# Patient Record
Sex: Male | Born: 1945
Health system: Southern US, Community
[De-identification: ages and names within clinical notes are randomized; demographics above are authoritative.]

## PROBLEM LIST (undated history)

## (undated) DIAGNOSIS — I519 Heart disease, unspecified: Secondary | ICD-10-CM

## (undated) DIAGNOSIS — F1010 Alcohol abuse, uncomplicated: Secondary | ICD-10-CM

## (undated) DIAGNOSIS — N183 Chronic kidney disease, stage 3 (moderate): Secondary | ICD-10-CM

## (undated) DIAGNOSIS — C61 Malignant neoplasm of prostate: Secondary | ICD-10-CM

## (undated) DIAGNOSIS — I5031 Acute diastolic (congestive) heart failure: Secondary | ICD-10-CM

## (undated) DIAGNOSIS — I5043 Acute on chronic combined systolic (congestive) and diastolic (congestive) heart failure: Secondary | ICD-10-CM

## (undated) DIAGNOSIS — I130 Hypertensive heart and chronic kidney disease with heart failure and stage 1 through stage 4 chronic kidney disease, or unspecified chronic kidney disease: Secondary | ICD-10-CM

## (undated) DIAGNOSIS — F4310 Post-traumatic stress disorder, unspecified: Secondary | ICD-10-CM

## (undated) DIAGNOSIS — K5731 Diverticulosis of large intestine without perforation or abscess with bleeding: Secondary | ICD-10-CM

## (undated) DIAGNOSIS — E785 Hyperlipidemia, unspecified: Secondary | ICD-10-CM

## (undated) DIAGNOSIS — G459 Transient cerebral ischemic attack, unspecified: Secondary | ICD-10-CM

## (undated) DIAGNOSIS — K922 Gastrointestinal hemorrhage, unspecified: Secondary | ICD-10-CM

## (undated) HISTORY — PX: STOMACH SURGERY: SHX791

## (undated) HISTORY — DX: Post-traumatic stress disorder, unspecified: F43.10

## (undated) HISTORY — DX: Gastrointestinal hemorrhage, unspecified: K92.2

## (undated) HISTORY — DX: Hyperlipidemia, unspecified: E78.5

## (undated) HISTORY — DX: Acute diastolic (congestive) heart failure: I50.31

## (undated) HISTORY — DX: Hypertensive heart and chronic kidney disease with heart failure and stage 1 through stage 4 chronic kidney disease, or unspecified chronic kidney disease: I13.0

## (undated) HISTORY — PX: TONSILLECTOMY: SUR1361

## (undated) HISTORY — DX: Diverticulosis of large intestine without perforation or abscess with bleeding: K57.31

## (undated) HISTORY — DX: Acute on chronic combined systolic (congestive) and diastolic (congestive) heart failure: I50.43

## (undated) HISTORY — PX: INSERTION PROSTATE RADIATION SEED: SUR718

## (undated) HISTORY — DX: Transient cerebral ischemic attack, unspecified: G45.9

## (undated) HISTORY — DX: Chronic kidney disease, stage 3 (moderate): N18.3

## (undated) HISTORY — DX: Alcohol abuse, uncomplicated: F10.10

## (undated) HISTORY — PX: APPENDECTOMY: SHX54

## (undated) HISTORY — PX: INGUINAL HERNIA REPAIR: SUR1180

## (undated) HISTORY — DX: Heart disease, unspecified: I51.9

---

## 2005-08-22 DIAGNOSIS — E538 Deficiency of other specified B group vitamins: Secondary | ICD-10-CM | POA: Insufficient documentation

## 2013-05-16 DIAGNOSIS — S61209A Unspecified open wound of unspecified finger without damage to nail, initial encounter: Secondary | ICD-10-CM | POA: Diagnosis not present

## 2013-09-25 DIAGNOSIS — L03039 Cellulitis of unspecified toe: Secondary | ICD-10-CM | POA: Diagnosis not present

## 2013-09-25 DIAGNOSIS — L600 Ingrowing nail: Secondary | ICD-10-CM | POA: Diagnosis not present

## 2013-11-17 DIAGNOSIS — R5381 Other malaise: Secondary | ICD-10-CM | POA: Diagnosis not present

## 2013-11-17 DIAGNOSIS — K922 Gastrointestinal hemorrhage, unspecified: Secondary | ICD-10-CM | POA: Diagnosis not present

## 2013-11-17 DIAGNOSIS — S3981XA Other specified injuries of abdomen, initial encounter: Secondary | ICD-10-CM | POA: Diagnosis not present

## 2013-11-17 DIAGNOSIS — R5383 Other fatigue: Secondary | ICD-10-CM | POA: Diagnosis not present

## 2013-11-17 DIAGNOSIS — D62 Acute posthemorrhagic anemia: Secondary | ICD-10-CM | POA: Diagnosis not present

## 2013-11-18 DIAGNOSIS — I10 Essential (primary) hypertension: Secondary | ICD-10-CM | POA: Diagnosis not present

## 2013-11-18 DIAGNOSIS — Z86010 Personal history of colonic polyps: Secondary | ICD-10-CM | POA: Diagnosis not present

## 2013-11-18 DIAGNOSIS — D62 Acute posthemorrhagic anemia: Secondary | ICD-10-CM | POA: Diagnosis not present

## 2013-11-18 DIAGNOSIS — D649 Anemia, unspecified: Secondary | ICD-10-CM | POA: Diagnosis not present

## 2013-11-18 DIAGNOSIS — D126 Benign neoplasm of colon, unspecified: Secondary | ICD-10-CM | POA: Diagnosis not present

## 2013-11-18 DIAGNOSIS — R195 Other fecal abnormalities: Secondary | ICD-10-CM | POA: Diagnosis not present

## 2013-11-18 DIAGNOSIS — K5730 Diverticulosis of large intestine without perforation or abscess without bleeding: Secondary | ICD-10-CM | POA: Diagnosis not present

## 2013-11-18 DIAGNOSIS — K921 Melena: Secondary | ICD-10-CM | POA: Diagnosis not present

## 2013-11-18 DIAGNOSIS — N189 Chronic kidney disease, unspecified: Secondary | ICD-10-CM | POA: Diagnosis not present

## 2013-11-19 DIAGNOSIS — D126 Benign neoplasm of colon, unspecified: Secondary | ICD-10-CM | POA: Diagnosis not present

## 2013-11-19 DIAGNOSIS — D62 Acute posthemorrhagic anemia: Secondary | ICD-10-CM | POA: Diagnosis not present

## 2013-11-19 DIAGNOSIS — R195 Other fecal abnormalities: Secondary | ICD-10-CM | POA: Diagnosis not present

## 2013-11-19 DIAGNOSIS — D649 Anemia, unspecified: Secondary | ICD-10-CM | POA: Diagnosis not present

## 2013-11-19 DIAGNOSIS — K922 Gastrointestinal hemorrhage, unspecified: Secondary | ICD-10-CM | POA: Diagnosis not present

## 2013-11-19 DIAGNOSIS — K921 Melena: Secondary | ICD-10-CM | POA: Diagnosis not present

## 2013-11-19 DIAGNOSIS — I10 Essential (primary) hypertension: Secondary | ICD-10-CM | POA: Diagnosis not present

## 2013-11-19 DIAGNOSIS — K5730 Diverticulosis of large intestine without perforation or abscess without bleeding: Secondary | ICD-10-CM | POA: Diagnosis not present

## 2013-11-19 DIAGNOSIS — Z86010 Personal history of colonic polyps: Secondary | ICD-10-CM | POA: Diagnosis not present

## 2013-11-20 DIAGNOSIS — K5730 Diverticulosis of large intestine without perforation or abscess without bleeding: Secondary | ICD-10-CM | POA: Diagnosis not present

## 2013-11-20 DIAGNOSIS — K922 Gastrointestinal hemorrhage, unspecified: Secondary | ICD-10-CM | POA: Diagnosis not present

## 2013-11-20 DIAGNOSIS — D62 Acute posthemorrhagic anemia: Secondary | ICD-10-CM | POA: Diagnosis not present

## 2013-11-20 DIAGNOSIS — I10 Essential (primary) hypertension: Secondary | ICD-10-CM | POA: Diagnosis not present

## 2013-11-27 DIAGNOSIS — Z6827 Body mass index (BMI) 27.0-27.9, adult: Secondary | ICD-10-CM | POA: Diagnosis not present

## 2013-11-27 DIAGNOSIS — D62 Acute posthemorrhagic anemia: Secondary | ICD-10-CM | POA: Diagnosis not present

## 2013-11-27 DIAGNOSIS — N289 Disorder of kidney and ureter, unspecified: Secondary | ICD-10-CM | POA: Diagnosis not present

## 2013-11-27 DIAGNOSIS — K922 Gastrointestinal hemorrhage, unspecified: Secondary | ICD-10-CM | POA: Diagnosis not present

## 2013-11-27 DIAGNOSIS — K5731 Diverticulosis of large intestine without perforation or abscess with bleeding: Secondary | ICD-10-CM | POA: Diagnosis not present

## 2013-12-11 DIAGNOSIS — D649 Anemia, unspecified: Secondary | ICD-10-CM | POA: Diagnosis not present

## 2013-12-18 DIAGNOSIS — Z86010 Personal history of colonic polyps: Secondary | ICD-10-CM | POA: Diagnosis not present

## 2013-12-18 DIAGNOSIS — D500 Iron deficiency anemia secondary to blood loss (chronic): Secondary | ICD-10-CM | POA: Diagnosis not present

## 2013-12-18 DIAGNOSIS — K5730 Diverticulosis of large intestine without perforation or abscess without bleeding: Secondary | ICD-10-CM | POA: Diagnosis not present

## 2013-12-18 DIAGNOSIS — D126 Benign neoplasm of colon, unspecified: Secondary | ICD-10-CM | POA: Diagnosis not present

## 2014-01-18 DIAGNOSIS — T7840XA Allergy, unspecified, initial encounter: Secondary | ICD-10-CM | POA: Diagnosis not present

## 2014-08-21 DIAGNOSIS — I10 Essential (primary) hypertension: Secondary | ICD-10-CM | POA: Diagnosis not present

## 2014-08-21 DIAGNOSIS — K4030 Unilateral inguinal hernia, with obstruction, without gangrene, not specified as recurrent: Secondary | ICD-10-CM | POA: Diagnosis not present

## 2014-08-21 DIAGNOSIS — R102 Pelvic and perineal pain: Secondary | ICD-10-CM | POA: Diagnosis not present

## 2014-08-21 DIAGNOSIS — Z8673 Personal history of transient ischemic attack (TIA), and cerebral infarction without residual deficits: Secondary | ICD-10-CM | POA: Diagnosis not present

## 2014-08-28 DIAGNOSIS — I10 Essential (primary) hypertension: Secondary | ICD-10-CM | POA: Diagnosis not present

## 2014-08-28 DIAGNOSIS — E780 Pure hypercholesterolemia: Secondary | ICD-10-CM | POA: Diagnosis not present

## 2014-08-28 DIAGNOSIS — Z01810 Encounter for preprocedural cardiovascular examination: Secondary | ICD-10-CM | POA: Diagnosis not present

## 2014-09-02 DIAGNOSIS — I10 Essential (primary) hypertension: Secondary | ICD-10-CM | POA: Diagnosis not present

## 2014-09-02 DIAGNOSIS — R0602 Shortness of breath: Secondary | ICD-10-CM | POA: Diagnosis not present

## 2014-09-02 DIAGNOSIS — G459 Transient cerebral ischemic attack, unspecified: Secondary | ICD-10-CM | POA: Diagnosis not present

## 2014-09-08 DIAGNOSIS — Z01810 Encounter for preprocedural cardiovascular examination: Secondary | ICD-10-CM | POA: Diagnosis not present

## 2014-09-08 DIAGNOSIS — R079 Chest pain, unspecified: Secondary | ICD-10-CM | POA: Diagnosis not present

## 2014-09-08 DIAGNOSIS — D689 Coagulation defect, unspecified: Secondary | ICD-10-CM | POA: Diagnosis not present

## 2014-09-08 DIAGNOSIS — E780 Pure hypercholesterolemia: Secondary | ICD-10-CM | POA: Diagnosis not present

## 2014-09-08 DIAGNOSIS — Z87891 Personal history of nicotine dependence: Secondary | ICD-10-CM | POA: Diagnosis not present

## 2014-09-08 DIAGNOSIS — I10 Essential (primary) hypertension: Secondary | ICD-10-CM | POA: Diagnosis not present

## 2014-09-16 DIAGNOSIS — E780 Pure hypercholesterolemia: Secondary | ICD-10-CM | POA: Diagnosis not present

## 2014-09-16 DIAGNOSIS — I10 Essential (primary) hypertension: Secondary | ICD-10-CM | POA: Diagnosis not present

## 2014-09-16 DIAGNOSIS — I25118 Atherosclerotic heart disease of native coronary artery with other forms of angina pectoris: Secondary | ICD-10-CM | POA: Diagnosis not present

## 2014-09-16 DIAGNOSIS — I2510 Atherosclerotic heart disease of native coronary artery without angina pectoris: Secondary | ICD-10-CM | POA: Diagnosis not present

## 2014-09-16 DIAGNOSIS — E785 Hyperlipidemia, unspecified: Secondary | ICD-10-CM | POA: Diagnosis not present

## 2014-09-16 DIAGNOSIS — Z79899 Other long term (current) drug therapy: Secondary | ICD-10-CM | POA: Diagnosis not present

## 2014-09-16 DIAGNOSIS — R9439 Abnormal result of other cardiovascular function study: Secondary | ICD-10-CM | POA: Diagnosis not present

## 2014-09-16 DIAGNOSIS — M1990 Unspecified osteoarthritis, unspecified site: Secondary | ICD-10-CM | POA: Diagnosis not present

## 2014-09-16 DIAGNOSIS — Z7902 Long term (current) use of antithrombotics/antiplatelets: Secondary | ICD-10-CM | POA: Diagnosis not present

## 2014-09-16 DIAGNOSIS — Z87891 Personal history of nicotine dependence: Secondary | ICD-10-CM | POA: Diagnosis not present

## 2014-09-16 DIAGNOSIS — Z8673 Personal history of transient ischemic attack (TIA), and cerebral infarction without residual deficits: Secondary | ICD-10-CM | POA: Diagnosis not present

## 2014-10-02 DIAGNOSIS — M1990 Unspecified osteoarthritis, unspecified site: Secondary | ICD-10-CM | POA: Diagnosis not present

## 2014-10-02 DIAGNOSIS — I10 Essential (primary) hypertension: Secondary | ICD-10-CM | POA: Diagnosis not present

## 2014-10-02 DIAGNOSIS — K4030 Unilateral inguinal hernia, with obstruction, without gangrene, not specified as recurrent: Secondary | ICD-10-CM | POA: Diagnosis not present

## 2014-10-13 DIAGNOSIS — Z8673 Personal history of transient ischemic attack (TIA), and cerebral infarction without residual deficits: Secondary | ICD-10-CM | POA: Diagnosis not present

## 2014-10-13 DIAGNOSIS — K4030 Unilateral inguinal hernia, with obstruction, without gangrene, not specified as recurrent: Secondary | ICD-10-CM | POA: Diagnosis not present

## 2014-10-13 DIAGNOSIS — Z7902 Long term (current) use of antithrombotics/antiplatelets: Secondary | ICD-10-CM | POA: Diagnosis not present

## 2014-10-13 DIAGNOSIS — Z79899 Other long term (current) drug therapy: Secondary | ICD-10-CM | POA: Diagnosis not present

## 2014-10-13 DIAGNOSIS — I898 Other specified noninfective disorders of lymphatic vessels and lymph nodes: Secondary | ICD-10-CM | POA: Diagnosis not present

## 2014-10-13 DIAGNOSIS — I2510 Atherosclerotic heart disease of native coronary artery without angina pectoris: Secondary | ICD-10-CM | POA: Diagnosis not present

## 2014-10-13 DIAGNOSIS — Z87891 Personal history of nicotine dependence: Secondary | ICD-10-CM | POA: Diagnosis not present

## 2014-10-13 DIAGNOSIS — R599 Enlarged lymph nodes, unspecified: Secondary | ICD-10-CM | POA: Diagnosis not present

## 2014-10-13 DIAGNOSIS — K4090 Unilateral inguinal hernia, without obstruction or gangrene, not specified as recurrent: Secondary | ICD-10-CM | POA: Diagnosis not present

## 2014-10-13 DIAGNOSIS — I10 Essential (primary) hypertension: Secondary | ICD-10-CM | POA: Diagnosis not present

## 2015-07-29 DIAGNOSIS — Z136 Encounter for screening for cardiovascular disorders: Secondary | ICD-10-CM | POA: Diagnosis not present

## 2015-07-29 DIAGNOSIS — Z125 Encounter for screening for malignant neoplasm of prostate: Secondary | ICD-10-CM | POA: Diagnosis not present

## 2015-07-29 DIAGNOSIS — Z131 Encounter for screening for diabetes mellitus: Secondary | ICD-10-CM | POA: Diagnosis not present

## 2015-07-31 DIAGNOSIS — R7301 Impaired fasting glucose: Secondary | ICD-10-CM | POA: Diagnosis not present

## 2015-09-27 DIAGNOSIS — M25569 Pain in unspecified knee: Secondary | ICD-10-CM | POA: Diagnosis not present

## 2015-09-28 DIAGNOSIS — M25561 Pain in right knee: Secondary | ICD-10-CM | POA: Diagnosis not present

## 2015-09-28 DIAGNOSIS — M170 Bilateral primary osteoarthritis of knee: Secondary | ICD-10-CM | POA: Diagnosis not present

## 2015-10-05 DIAGNOSIS — M25561 Pain in right knee: Secondary | ICD-10-CM | POA: Diagnosis not present

## 2015-10-05 DIAGNOSIS — M109 Gout, unspecified: Secondary | ICD-10-CM | POA: Diagnosis not present

## 2015-10-08 DIAGNOSIS — M109 Gout, unspecified: Secondary | ICD-10-CM | POA: Diagnosis not present

## 2015-10-08 DIAGNOSIS — M2031 Hallux varus (acquired), right foot: Secondary | ICD-10-CM | POA: Diagnosis not present

## 2015-10-08 DIAGNOSIS — Z6824 Body mass index (BMI) 24.0-24.9, adult: Secondary | ICD-10-CM | POA: Diagnosis not present

## 2015-11-05 DIAGNOSIS — Z23 Encounter for immunization: Secondary | ICD-10-CM | POA: Diagnosis not present

## 2015-11-05 DIAGNOSIS — I10 Essential (primary) hypertension: Secondary | ICD-10-CM | POA: Diagnosis not present

## 2015-11-05 DIAGNOSIS — M109 Gout, unspecified: Secondary | ICD-10-CM | POA: Diagnosis not present

## 2015-11-05 DIAGNOSIS — R7301 Impaired fasting glucose: Secondary | ICD-10-CM | POA: Diagnosis not present

## 2015-11-05 DIAGNOSIS — E785 Hyperlipidemia, unspecified: Secondary | ICD-10-CM | POA: Diagnosis not present

## 2015-12-21 DIAGNOSIS — S0181XA Laceration without foreign body of other part of head, initial encounter: Secondary | ICD-10-CM | POA: Diagnosis not present

## 2015-12-21 DIAGNOSIS — Z01818 Encounter for other preprocedural examination: Secondary | ICD-10-CM | POA: Diagnosis not present

## 2015-12-21 DIAGNOSIS — R9431 Abnormal electrocardiogram [ECG] [EKG]: Secondary | ICD-10-CM | POA: Diagnosis not present

## 2015-12-21 DIAGNOSIS — M25561 Pain in right knee: Secondary | ICD-10-CM | POA: Diagnosis not present

## 2015-12-21 DIAGNOSIS — R42 Dizziness and giddiness: Secondary | ICD-10-CM | POA: Diagnosis not present

## 2015-12-21 DIAGNOSIS — M109 Gout, unspecified: Secondary | ICD-10-CM | POA: Diagnosis not present

## 2015-12-21 DIAGNOSIS — S0990XA Unspecified injury of head, initial encounter: Secondary | ICD-10-CM | POA: Diagnosis not present

## 2015-12-22 DIAGNOSIS — M00862 Arthritis due to other bacteria, left knee: Secondary | ICD-10-CM | POA: Diagnosis not present

## 2015-12-22 DIAGNOSIS — M1A0620 Idiopathic chronic gout, left knee, without tophus (tophi): Secondary | ICD-10-CM | POA: Diagnosis not present

## 2015-12-22 DIAGNOSIS — M009 Pyogenic arthritis, unspecified: Secondary | ICD-10-CM | POA: Diagnosis not present

## 2015-12-22 DIAGNOSIS — M25562 Pain in left knee: Secondary | ICD-10-CM | POA: Diagnosis not present

## 2015-12-22 DIAGNOSIS — R7881 Bacteremia: Secondary | ICD-10-CM | POA: Diagnosis not present

## 2015-12-25 DIAGNOSIS — J189 Pneumonia, unspecified organism: Secondary | ICD-10-CM | POA: Diagnosis not present

## 2015-12-26 DIAGNOSIS — J9620 Acute and chronic respiratory failure, unspecified whether with hypoxia or hypercapnia: Secondary | ICD-10-CM | POA: Diagnosis not present

## 2015-12-26 DIAGNOSIS — J189 Pneumonia, unspecified organism: Secondary | ICD-10-CM | POA: Diagnosis not present

## 2015-12-27 DIAGNOSIS — J9620 Acute and chronic respiratory failure, unspecified whether with hypoxia or hypercapnia: Secondary | ICD-10-CM | POA: Diagnosis not present

## 2015-12-27 DIAGNOSIS — J189 Pneumonia, unspecified organism: Secondary | ICD-10-CM | POA: Diagnosis not present

## 2015-12-28 DIAGNOSIS — J189 Pneumonia, unspecified organism: Secondary | ICD-10-CM | POA: Diagnosis not present

## 2015-12-28 DIAGNOSIS — Z0389 Encounter for observation for other suspected diseases and conditions ruled out: Secondary | ICD-10-CM | POA: Diagnosis not present

## 2015-12-28 DIAGNOSIS — J9620 Acute and chronic respiratory failure, unspecified whether with hypoxia or hypercapnia: Secondary | ICD-10-CM | POA: Diagnosis not present

## 2015-12-29 DIAGNOSIS — J9620 Acute and chronic respiratory failure, unspecified whether with hypoxia or hypercapnia: Secondary | ICD-10-CM | POA: Diagnosis not present

## 2015-12-29 DIAGNOSIS — J189 Pneumonia, unspecified organism: Secondary | ICD-10-CM | POA: Diagnosis not present

## 2016-01-21 DIAGNOSIS — H8113 Benign paroxysmal vertigo, bilateral: Secondary | ICD-10-CM | POA: Diagnosis not present

## 2016-01-21 DIAGNOSIS — M109 Gout, unspecified: Secondary | ICD-10-CM | POA: Diagnosis not present

## 2016-01-21 DIAGNOSIS — Z6825 Body mass index (BMI) 25.0-25.9, adult: Secondary | ICD-10-CM | POA: Diagnosis not present

## 2016-01-21 DIAGNOSIS — M009 Pyogenic arthritis, unspecified: Secondary | ICD-10-CM | POA: Diagnosis not present

## 2016-01-21 DIAGNOSIS — J9601 Acute respiratory failure with hypoxia: Secondary | ICD-10-CM | POA: Diagnosis not present

## 2016-01-21 DIAGNOSIS — R590 Localized enlarged lymph nodes: Secondary | ICD-10-CM | POA: Diagnosis not present

## 2016-01-21 DIAGNOSIS — E663 Overweight: Secondary | ICD-10-CM | POA: Diagnosis not present

## 2016-01-27 DIAGNOSIS — G4733 Obstructive sleep apnea (adult) (pediatric): Secondary | ICD-10-CM | POA: Diagnosis not present

## 2016-01-27 DIAGNOSIS — R918 Other nonspecific abnormal finding of lung field: Secondary | ICD-10-CM | POA: Diagnosis not present

## 2016-01-27 DIAGNOSIS — J4520 Mild intermittent asthma, uncomplicated: Secondary | ICD-10-CM | POA: Diagnosis not present

## 2016-01-27 DIAGNOSIS — R5383 Other fatigue: Secondary | ICD-10-CM | POA: Diagnosis not present

## 2016-01-29 DIAGNOSIS — Z6825 Body mass index (BMI) 25.0-25.9, adult: Secondary | ICD-10-CM | POA: Diagnosis not present

## 2016-01-29 DIAGNOSIS — M109 Gout, unspecified: Secondary | ICD-10-CM | POA: Diagnosis not present

## 2016-02-02 DIAGNOSIS — K5730 Diverticulosis of large intestine without perforation or abscess without bleeding: Secondary | ICD-10-CM | POA: Diagnosis not present

## 2016-02-02 DIAGNOSIS — J432 Centrilobular emphysema: Secondary | ICD-10-CM | POA: Diagnosis not present

## 2016-02-02 DIAGNOSIS — I2510 Atherosclerotic heart disease of native coronary artery without angina pectoris: Secondary | ICD-10-CM | POA: Diagnosis not present

## 2016-02-02 DIAGNOSIS — R918 Other nonspecific abnormal finding of lung field: Secondary | ICD-10-CM | POA: Diagnosis not present

## 2016-05-05 DIAGNOSIS — M109 Gout, unspecified: Secondary | ICD-10-CM | POA: Diagnosis not present

## 2016-05-05 DIAGNOSIS — E785 Hyperlipidemia, unspecified: Secondary | ICD-10-CM | POA: Diagnosis not present

## 2016-05-05 DIAGNOSIS — R7301 Impaired fasting glucose: Secondary | ICD-10-CM | POA: Diagnosis not present

## 2016-05-05 DIAGNOSIS — Z6825 Body mass index (BMI) 25.0-25.9, adult: Secondary | ICD-10-CM | POA: Diagnosis not present

## 2016-05-05 DIAGNOSIS — M25422 Effusion, left elbow: Secondary | ICD-10-CM | POA: Diagnosis not present

## 2016-05-05 DIAGNOSIS — I10 Essential (primary) hypertension: Secondary | ICD-10-CM | POA: Diagnosis not present

## 2016-05-05 DIAGNOSIS — L989 Disorder of the skin and subcutaneous tissue, unspecified: Secondary | ICD-10-CM | POA: Diagnosis not present

## 2016-11-08 DIAGNOSIS — M109 Gout, unspecified: Secondary | ICD-10-CM | POA: Diagnosis not present

## 2016-11-08 DIAGNOSIS — Z125 Encounter for screening for malignant neoplasm of prostate: Secondary | ICD-10-CM | POA: Diagnosis not present

## 2016-11-08 DIAGNOSIS — E785 Hyperlipidemia, unspecified: Secondary | ICD-10-CM | POA: Diagnosis not present

## 2016-11-08 DIAGNOSIS — Z6825 Body mass index (BMI) 25.0-25.9, adult: Secondary | ICD-10-CM | POA: Diagnosis not present

## 2016-11-08 DIAGNOSIS — R7301 Impaired fasting glucose: Secondary | ICD-10-CM | POA: Diagnosis not present

## 2016-11-08 DIAGNOSIS — I10 Essential (primary) hypertension: Secondary | ICD-10-CM | POA: Diagnosis not present

## 2016-11-08 DIAGNOSIS — Z9181 History of falling: Secondary | ICD-10-CM | POA: Diagnosis not present

## 2017-01-10 DIAGNOSIS — Z6825 Body mass index (BMI) 25.0-25.9, adult: Secondary | ICD-10-CM | POA: Diagnosis not present

## 2017-01-10 DIAGNOSIS — N6320 Unspecified lump in the left breast, unspecified quadrant: Secondary | ICD-10-CM | POA: Diagnosis not present

## 2017-01-17 DIAGNOSIS — Z0000 Encounter for general adult medical examination without abnormal findings: Secondary | ICD-10-CM | POA: Diagnosis not present

## 2017-01-17 DIAGNOSIS — Z136 Encounter for screening for cardiovascular disorders: Secondary | ICD-10-CM | POA: Diagnosis not present

## 2017-01-17 DIAGNOSIS — Z1211 Encounter for screening for malignant neoplasm of colon: Secondary | ICD-10-CM | POA: Diagnosis not present

## 2017-01-17 DIAGNOSIS — Z125 Encounter for screening for malignant neoplasm of prostate: Secondary | ICD-10-CM | POA: Diagnosis not present

## 2017-01-17 DIAGNOSIS — Z1389 Encounter for screening for other disorder: Secondary | ICD-10-CM | POA: Diagnosis not present

## 2017-01-17 DIAGNOSIS — E785 Hyperlipidemia, unspecified: Secondary | ICD-10-CM | POA: Diagnosis not present

## 2017-01-17 DIAGNOSIS — Z9181 History of falling: Secondary | ICD-10-CM | POA: Diagnosis not present

## 2017-01-18 DIAGNOSIS — N6320 Unspecified lump in the left breast, unspecified quadrant: Secondary | ICD-10-CM | POA: Diagnosis not present

## 2017-01-18 DIAGNOSIS — N62 Hypertrophy of breast: Secondary | ICD-10-CM | POA: Diagnosis not present

## 2017-01-18 DIAGNOSIS — J209 Acute bronchitis, unspecified: Secondary | ICD-10-CM | POA: Diagnosis not present

## 2017-01-23 DIAGNOSIS — H9071 Mixed conductive and sensorineural hearing loss, unilateral, right ear, with unrestricted hearing on the contralateral side: Secondary | ICD-10-CM | POA: Diagnosis not present

## 2017-01-23 DIAGNOSIS — H9313 Tinnitus, bilateral: Secondary | ICD-10-CM | POA: Diagnosis not present

## 2017-01-23 DIAGNOSIS — H9042 Sensorineural hearing loss, unilateral, left ear, with unrestricted hearing on the contralateral side: Secondary | ICD-10-CM | POA: Diagnosis not present

## 2017-01-23 DIAGNOSIS — H903 Sensorineural hearing loss, bilateral: Secondary | ICD-10-CM | POA: Diagnosis not present

## 2017-01-23 DIAGNOSIS — H9211 Otorrhea, right ear: Secondary | ICD-10-CM | POA: Diagnosis not present

## 2017-01-26 DIAGNOSIS — D170 Benign lipomatous neoplasm of skin and subcutaneous tissue of head, face and neck: Secondary | ICD-10-CM | POA: Diagnosis not present

## 2017-01-26 DIAGNOSIS — H903 Sensorineural hearing loss, bilateral: Secondary | ICD-10-CM | POA: Diagnosis not present

## 2017-02-01 DIAGNOSIS — H903 Sensorineural hearing loss, bilateral: Secondary | ICD-10-CM | POA: Diagnosis not present

## 2017-02-01 DIAGNOSIS — H9313 Tinnitus, bilateral: Secondary | ICD-10-CM | POA: Diagnosis not present

## 2017-05-11 DIAGNOSIS — Z9181 History of falling: Secondary | ICD-10-CM | POA: Diagnosis not present

## 2017-05-11 DIAGNOSIS — R7301 Impaired fasting glucose: Secondary | ICD-10-CM | POA: Diagnosis not present

## 2017-05-11 DIAGNOSIS — I10 Essential (primary) hypertension: Secondary | ICD-10-CM | POA: Diagnosis not present

## 2017-05-11 DIAGNOSIS — M109 Gout, unspecified: Secondary | ICD-10-CM | POA: Diagnosis not present

## 2017-05-11 DIAGNOSIS — Z6824 Body mass index (BMI) 24.0-24.9, adult: Secondary | ICD-10-CM | POA: Diagnosis not present

## 2017-05-11 DIAGNOSIS — E785 Hyperlipidemia, unspecified: Secondary | ICD-10-CM | POA: Diagnosis not present

## 2017-08-02 DIAGNOSIS — M25551 Pain in right hip: Secondary | ICD-10-CM | POA: Diagnosis not present

## 2017-08-02 DIAGNOSIS — F4310 Post-traumatic stress disorder, unspecified: Secondary | ICD-10-CM | POA: Diagnosis not present

## 2017-08-02 DIAGNOSIS — I10 Essential (primary) hypertension: Secondary | ICD-10-CM | POA: Diagnosis not present

## 2017-08-02 DIAGNOSIS — G459 Transient cerebral ischemic attack, unspecified: Secondary | ICD-10-CM | POA: Diagnosis not present

## 2017-08-03 DIAGNOSIS — F4310 Post-traumatic stress disorder, unspecified: Secondary | ICD-10-CM | POA: Diagnosis not present

## 2017-08-03 DIAGNOSIS — G459 Transient cerebral ischemic attack, unspecified: Secondary | ICD-10-CM | POA: Diagnosis not present

## 2017-08-03 DIAGNOSIS — I10 Essential (primary) hypertension: Secondary | ICD-10-CM | POA: Diagnosis not present

## 2017-08-03 DIAGNOSIS — M25551 Pain in right hip: Secondary | ICD-10-CM | POA: Diagnosis not present

## 2017-08-04 DIAGNOSIS — F4310 Post-traumatic stress disorder, unspecified: Secondary | ICD-10-CM | POA: Diagnosis not present

## 2017-08-07 DIAGNOSIS — F4310 Post-traumatic stress disorder, unspecified: Secondary | ICD-10-CM | POA: Diagnosis not present

## 2017-08-08 DIAGNOSIS — F4310 Post-traumatic stress disorder, unspecified: Secondary | ICD-10-CM | POA: Diagnosis not present

## 2017-08-08 DIAGNOSIS — G459 Transient cerebral ischemic attack, unspecified: Secondary | ICD-10-CM | POA: Diagnosis not present

## 2017-08-08 DIAGNOSIS — I10 Essential (primary) hypertension: Secondary | ICD-10-CM | POA: Diagnosis not present

## 2017-08-08 DIAGNOSIS — M25551 Pain in right hip: Secondary | ICD-10-CM | POA: Diagnosis not present

## 2017-08-09 DIAGNOSIS — F4310 Post-traumatic stress disorder, unspecified: Secondary | ICD-10-CM | POA: Diagnosis not present

## 2017-11-14 DIAGNOSIS — I10 Essential (primary) hypertension: Secondary | ICD-10-CM | POA: Diagnosis not present

## 2017-11-14 DIAGNOSIS — Z6826 Body mass index (BMI) 26.0-26.9, adult: Secondary | ICD-10-CM | POA: Diagnosis not present

## 2017-11-14 DIAGNOSIS — Z125 Encounter for screening for malignant neoplasm of prostate: Secondary | ICD-10-CM | POA: Diagnosis not present

## 2017-11-14 DIAGNOSIS — M109 Gout, unspecified: Secondary | ICD-10-CM | POA: Diagnosis not present

## 2017-11-14 DIAGNOSIS — R7301 Impaired fasting glucose: Secondary | ICD-10-CM | POA: Diagnosis not present

## 2017-11-14 DIAGNOSIS — E785 Hyperlipidemia, unspecified: Secondary | ICD-10-CM | POA: Diagnosis not present

## 2017-11-21 DIAGNOSIS — R972 Elevated prostate specific antigen [PSA]: Secondary | ICD-10-CM | POA: Diagnosis not present

## 2017-11-21 DIAGNOSIS — N401 Enlarged prostate with lower urinary tract symptoms: Secondary | ICD-10-CM | POA: Diagnosis not present

## 2017-12-05 DIAGNOSIS — N401 Enlarged prostate with lower urinary tract symptoms: Secondary | ICD-10-CM | POA: Diagnosis not present

## 2017-12-05 DIAGNOSIS — R972 Elevated prostate specific antigen [PSA]: Secondary | ICD-10-CM | POA: Diagnosis not present

## 2018-01-31 DIAGNOSIS — Z9181 History of falling: Secondary | ICD-10-CM | POA: Diagnosis not present

## 2018-01-31 DIAGNOSIS — Z125 Encounter for screening for malignant neoplasm of prostate: Secondary | ICD-10-CM | POA: Diagnosis not present

## 2018-01-31 DIAGNOSIS — Z0000 Encounter for general adult medical examination without abnormal findings: Secondary | ICD-10-CM | POA: Diagnosis not present

## 2018-01-31 DIAGNOSIS — E785 Hyperlipidemia, unspecified: Secondary | ICD-10-CM | POA: Diagnosis not present

## 2018-01-31 DIAGNOSIS — Z1331 Encounter for screening for depression: Secondary | ICD-10-CM | POA: Diagnosis not present

## 2018-01-31 DIAGNOSIS — Z136 Encounter for screening for cardiovascular disorders: Secondary | ICD-10-CM | POA: Diagnosis not present

## 2018-02-25 DIAGNOSIS — S81811A Laceration without foreign body, right lower leg, initial encounter: Secondary | ICD-10-CM | POA: Diagnosis not present

## 2018-03-13 DIAGNOSIS — S81801A Unspecified open wound, right lower leg, initial encounter: Secondary | ICD-10-CM | POA: Diagnosis not present

## 2018-03-19 DIAGNOSIS — R0902 Hypoxemia: Secondary | ICD-10-CM | POA: Diagnosis not present

## 2018-03-19 DIAGNOSIS — Z8673 Personal history of transient ischemic attack (TIA), and cerebral infarction without residual deficits: Secondary | ICD-10-CM

## 2018-03-19 DIAGNOSIS — F1010 Alcohol abuse, uncomplicated: Secondary | ICD-10-CM

## 2018-03-19 DIAGNOSIS — R609 Edema, unspecified: Secondary | ICD-10-CM | POA: Diagnosis not present

## 2018-03-19 DIAGNOSIS — J9601 Acute respiratory failure with hypoxia: Secondary | ICD-10-CM

## 2018-03-19 DIAGNOSIS — J449 Chronic obstructive pulmonary disease, unspecified: Secondary | ICD-10-CM

## 2018-03-19 DIAGNOSIS — I10 Essential (primary) hypertension: Secondary | ICD-10-CM | POA: Diagnosis not present

## 2018-03-19 DIAGNOSIS — R0602 Shortness of breath: Secondary | ICD-10-CM | POA: Diagnosis not present

## 2018-03-19 DIAGNOSIS — I509 Heart failure, unspecified: Secondary | ICD-10-CM | POA: Diagnosis not present

## 2018-03-19 DIAGNOSIS — I110 Hypertensive heart disease with heart failure: Secondary | ICD-10-CM | POA: Diagnosis not present

## 2018-03-19 DIAGNOSIS — R0689 Other abnormalities of breathing: Secondary | ICD-10-CM | POA: Diagnosis not present

## 2018-03-19 DIAGNOSIS — J9600 Acute respiratory failure, unspecified whether with hypoxia or hypercapnia: Secondary | ICD-10-CM

## 2018-03-19 DIAGNOSIS — E7800 Pure hypercholesterolemia, unspecified: Secondary | ICD-10-CM

## 2018-03-20 DIAGNOSIS — J9601 Acute respiratory failure with hypoxia: Secondary | ICD-10-CM | POA: Diagnosis not present

## 2018-03-20 DIAGNOSIS — I509 Heart failure, unspecified: Secondary | ICD-10-CM | POA: Diagnosis not present

## 2018-03-21 DIAGNOSIS — I509 Heart failure, unspecified: Secondary | ICD-10-CM | POA: Diagnosis not present

## 2018-03-21 DIAGNOSIS — J9601 Acute respiratory failure with hypoxia: Secondary | ICD-10-CM | POA: Diagnosis not present

## 2018-03-26 DIAGNOSIS — I10 Essential (primary) hypertension: Secondary | ICD-10-CM | POA: Diagnosis not present

## 2018-03-26 DIAGNOSIS — J9601 Acute respiratory failure with hypoxia: Secondary | ICD-10-CM | POA: Diagnosis not present

## 2018-03-26 DIAGNOSIS — E785 Hyperlipidemia, unspecified: Secondary | ICD-10-CM | POA: Diagnosis not present

## 2018-03-26 DIAGNOSIS — I5043 Acute on chronic combined systolic (congestive) and diastolic (congestive) heart failure: Secondary | ICD-10-CM | POA: Diagnosis not present

## 2018-03-28 DIAGNOSIS — K922 Gastrointestinal hemorrhage, unspecified: Secondary | ICD-10-CM | POA: Insufficient documentation

## 2018-03-28 DIAGNOSIS — E785 Hyperlipidemia, unspecified: Secondary | ICD-10-CM

## 2018-03-28 DIAGNOSIS — F4310 Post-traumatic stress disorder, unspecified: Secondary | ICD-10-CM

## 2018-03-28 DIAGNOSIS — N183 Chronic kidney disease, stage 3 unspecified: Secondary | ICD-10-CM

## 2018-03-28 DIAGNOSIS — K5731 Diverticulosis of large intestine without perforation or abscess with bleeding: Secondary | ICD-10-CM

## 2018-03-28 DIAGNOSIS — I5031 Acute diastolic (congestive) heart failure: Secondary | ICD-10-CM

## 2018-03-28 DIAGNOSIS — I130 Hypertensive heart and chronic kidney disease with heart failure and stage 1 through stage 4 chronic kidney disease, or unspecified chronic kidney disease: Secondary | ICD-10-CM

## 2018-03-28 DIAGNOSIS — I5043 Acute on chronic combined systolic (congestive) and diastolic (congestive) heart failure: Secondary | ICD-10-CM

## 2018-03-28 DIAGNOSIS — G459 Transient cerebral ischemic attack, unspecified: Secondary | ICD-10-CM

## 2018-03-28 DIAGNOSIS — F1010 Alcohol abuse, uncomplicated: Secondary | ICD-10-CM | POA: Insufficient documentation

## 2018-03-28 HISTORY — DX: Hypertensive heart and chronic kidney disease with heart failure and stage 1 through stage 4 chronic kidney disease, or unspecified chronic kidney disease: I13.0

## 2018-03-28 HISTORY — DX: Alcohol abuse, uncomplicated: F10.10

## 2018-03-28 HISTORY — DX: Post-traumatic stress disorder, unspecified: F43.10

## 2018-03-28 HISTORY — DX: Diverticulosis of large intestine without perforation or abscess with bleeding: K57.31

## 2018-03-28 HISTORY — DX: Gastrointestinal hemorrhage, unspecified: K92.2

## 2018-03-28 HISTORY — DX: Chronic kidney disease, stage 3 unspecified: N18.30

## 2018-03-28 HISTORY — DX: Acute on chronic combined systolic (congestive) and diastolic (congestive) heart failure: I50.43

## 2018-03-28 HISTORY — DX: Hyperlipidemia, unspecified: E78.5

## 2018-03-28 HISTORY — DX: Transient cerebral ischemic attack, unspecified: G45.9

## 2018-04-23 NOTE — Progress Notes (Signed)
Cardiology Office Note:    Date:  04/24/2018   ID:  Andre Ross, DOB February 16, 1946, MRN [id number]  PCP:  Nicoletta Dress, MD  Cardiologist:  Shirlee More, MD   Referring MD: Nicoletta Dress, MD  ASSESSMENT:    1. Acute on chronic combined systolic and diastolic CHF (congestive heart failure) ([place])   2. 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 and stage 3 chronic kidney disease ([place])   3. Alcohol abuse   4. Left ventricular systolic dysfunction    PLAN:    In order of problems listed above:  1. Improved however I think that his dose of diuretic is inadequate he will take the standard 40 mg of furosemide daily I asked him to explore with his physicians alternatives to gabapentin because his sodium retention I will await the results of testing performed recently at the [facility] before deciding an ischemia evaluation with segmental LV dysfunction on echocardiogram. 2. Stable continue current antihypertensive therapy including his diuretic and beta-blocker. 3. I reviewed and the poor prognosis of alcohol induced cardiomyopathy and strongly encouraged complete abstinence. 4. Segmental suggesting CAD await results of testing at the [facility] before decision about ischemia evaluation.  Likely cardiac CTA will be required  Next appointment 3 months   Medication Adjustments/Labs and Tests Ordered: Current medicines are reviewed at length with the patient today.  Concerns regarding medicines are outlined above.  No orders of the defined types were placed in this encounter.  Meds ordered this encounter  Medications  . furosemide (LASIX) 40 MG tablet    Sig: Take 1 tablet (40 mg total) by mouth daily.    Dispense:  30 tablet    Refill:  3     Chief Complaint  Patient presents with  . Congestive Heart Failure    recent admission to [facility] with alcohol abuse  . Hypertension  . Chronic Kidney Disease    History of Present Illness:     Andre Ross is a 72 y.o. male who is being seen today for the evaluation of heart failure at the request of Nicoletta Dress, MD.  He was admitted [facility] 03/19/18 with CHF and and alcohol abuse EF 45-50% by echo.  Renal hospital records reviewed he presents to the emergency room 03/19/2018 after binge alcohol consumption shortness of breath respiratory failure requiring BiPAP therapy was felt to have heart failure superimposed on aspiration pneumonia.  Laboratory studies showed BNP level moderately elevated 1820 troponin 0 0.02 creatinine 1.2 GFR 59 cc discharge creatinine was 1.50 potassium 3.5 hemoglobin 13.9 he had an elevation of troponin increasing to 0.26 and a normal TSH.  Other echo findings include moderate mitral and tricuspid regurgitation mild aortic regurgitation.  His EKG showed sinus rhythm was normal.  Following admission antibiotics were discontinued he was treated for heart failure and discharged from the hospital on furosemide 40 mg daily carvedilol and hydralazine.  Discharge summary does not comment on his mildly elevated troponin in the setting of decompensated heart failure.  Unfortunately do not have records but the patient tells me April of this year was at the [facility] hospital in [facility] for 1 week as part of evaluation had cardiac testing including a myocardial perfusion study that was relayed to him as normal.  Despite this he is taken a diuretic in the past with edema but had not been taking it prior to the admission to the hospital.  He is unaware that he  is ever had heart failure in the past.  He does take a beta-blocker for tremor.  This admission was associated with alcohol abuse and he also takes gabapentin for chronic pain syndrome which causes marked sodium retention I asked him to explore other options with the prescribing doctor at the [facility].  He has had no chest pain palpitation or TIA.  Since discharge he is restricted sodium his weight is been stable  he is improved but he still short of breath with physical activity more than usual.  Past Medical History:  Diagnosis Date  . Acute gastrointestinal bleeding 03/28/2018  . Acute on chronic combined systolic and diastolic CHF (congestive heart failure) ([place]) 03/28/2018  . Alcohol abuse 03/28/2018  . CKD (chronic kidney disease) stage 3, GFR 30-59 ml/min ([facility]) 03/28/2018  . Diverticular hemorrhage 03/28/2018  . Hyperlipidemia 03/28/2018  . 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 and stage 3 chronic kidney disease ([place]) 03/28/2018  . Left ventricular systolic dysfunction 04/24/8100   Segmental LAD distribution EF 45-50%  . PTSD (post-traumatic stress disorder) 03/28/2018  . TIA (transient ischemic attack) 03/28/2018    Past Surgical History:  Procedure Laterality Date  . APPENDECTOMY    . INGUINAL HERNIA REPAIR    . STOMACH SURGERY    . TONSILLECTOMY      Current Medications: Current Meds  Medication Sig  . acetaminophen (TYLENOL) 500 MG tablet Take 1 tablet by mouth 4 (four) times daily as needed.  . ALLERGY NON-DROWSY 10 MG tablet Take 10 mg by mouth daily.  Marland Kitchen allopurinol (ZYLOPRIM) 300 MG tablet Take 300 mg by mouth daily.  . Carboxymethylcellulose Sod PF 0.25 % SOLN Apply 15 mLs to eye 4 (four) times daily as needed.  . Cholecalciferol (VITAMIN D) 2000 units tablet Take 2,000 Units by mouth daily.  . citalopram (CELEXA) 40 MG tablet Take 20 mg by mouth daily.  . clopidogrel (PLAVIX) 75 MG tablet Take 75 mg by mouth daily.  . folic acid (FOLVITE) 1 MG tablet Take 1 mg by mouth daily.  . furosemide (LASIX) 40 MG tablet Take 1 tablet (40 mg total) by mouth daily.  Marland Kitchen gabapentin (NEURONTIN) 100 MG capsule Take 200 mg by mouth 3 (three) times daily.   . hydrOXYzine (VISTARIL) 25 MG capsule Take 25 mg by mouth daily.  . isosorbide mononitrate (IMDUR) 60 MG 24 hr tablet Take 60 mg by mouth daily.  . Magnesium Oxide 420 MG TABS Take 420 mg by mouth 2 (two)  times daily.  . Multiple Vitamin (MULTIVITAMIN) tablet Take 1 tablet by mouth daily.  . mupirocin ointment (BACTROBAN) 2 %   . propranolol ER (INDERAL LA) 120 MG 24 hr capsule Take 120 mg by mouth daily.  . ranitidine (ZANTAC) 150 MG tablet Take 150 mg by mouth at bedtime.  . simvastatin (ZOCOR) 20 MG tablet Take 20 mg by mouth daily.  . tamsulosin (FLOMAX) 0.4 MG CAPS capsule Take 0.4 mg by mouth daily.  . vitamin B-12 (CYANOCOBALAMIN) 500 MCG tablet Take 1,000 mcg by mouth daily.  . [DISCONTINUED] furosemide (LASIX) 20 MG tablet Take 20 mg by mouth daily.     Allergies:   Aspirin and Hydrocodone   Social History   Socioeconomic History  . Marital status: Married    Spouse name: Not on file  . Number of children: Not on file  . Years of education: Not on file  . Highest education level: Not on file  Occupational History  .  Not on file  Social Needs  . Financial resource strain: Not on file  . Food insecurity:    Worry: Not on file    Inability: Not on file  . Transportation needs:    Medical: Not on file    Non-medical: Not on file  Tobacco Use  . Smoking status: Former Smoker    Packs/day: 1.00    Years: 30.00    Pack years: 30.00    Types: Cigarettes    Last attempt to quit: 1988    Years since quitting: 31.6  . Smokeless tobacco: Never Used  Substance and Sexual Activity  . Alcohol use: Yes    Comment: occasionally  . Drug use: Not Currently  . Sexual activity: Not on file  Lifestyle  . Physical activity:    Days per week: Not on file    Minutes per session: Not on file  . Stress: Not on file  Relationships  . Social connections:    Talks on phone: Not on file    Gets together: Not on file    Attends religious service: Not on file    Active member of club or organization: Not on file    Attends meetings of clubs or organizations: Not on file    Relationship status: Not on file  Other Topics Concern  . Not on file  Social History Narrative  . Not on  file     Family History: The patient's family history includes COPD in his father; Heart attack in his father; Heart disease in his father; Hypertension in his maternal grandmother and paternal grandmother.  ROS:   Review of Systems  Constitution: Negative.  HENT: Positive for hearing loss.   Eyes: Negative.   Cardiovascular: Positive for dyspnea on exertion.  Respiratory: Positive for sleep disturbances due to breathing.   Endocrine: Negative.   Hematologic/Lymphatic: Bruises/bleeds easily.  Skin: Negative.   Musculoskeletal: Negative.   Gastrointestinal: Negative.   Genitourinary: Negative.   Neurological: Negative.   Psychiatric/Behavioral: Negative.   Allergic/Immunologic: Negative.    Please see the history of present illness.     All other systems reviewed and are negative.  EKGs/Labs/Other Studies Reviewed:    The following studies were reviewed today:   Recent Labs:   CMP with Cr 1.47, K 4.5  OW normal No results found for requested labs within last 8760 hours.  Recent Lipid Panel No results found for: CHOL, TRIG, HDL, CHOLHDL, VLDL, LDLCALC, LDLDIRECT  Physical Exam:    VS:  BP 112/74 (BP Location: Right Arm, Patient Position: Sitting, Cuff Size: Normal)   Pulse 66   Ht 5\' 9"  (1.753 m)   Wt 179 lb 12.8 oz (81.6 kg)   SpO2 96%   BMI 26.55 kg/m     Wt Readings from Last 3 Encounters:  04/24/18 179 lb 12.8 oz (81.6 kg)     GEN:  Well nourished, well developed in no acute distress HEENT: Normal NECK: No JVD; No carotid bruits LYMPHATICS: No lymphadenopathy CARDIAC: s4 RRR, no murmurs, rubs, gallops RESPIRATORY:  Clear to auscultation without rales, wheezing or rhonchi  ABDOMEN: Soft, non-tender, non-distended MUSCULOSKELETAL:  No edema; No deformity  SKIN: Warm and dry NEUROLOGIC:  Alert and oriented x 3 PSYCHIATRIC:  Normal affect     Signed, Shirlee More, MD  04/24/2018 12:06 PM    Toast

## 2018-04-24 ENCOUNTER — Ambulatory Visit (INDEPENDENT_AMBULATORY_CARE_PROVIDER_SITE_OTHER): Payer: Medicare Other | Admitting: Cardiology

## 2018-04-24 ENCOUNTER — Encounter: Payer: Self-pay | Admitting: Cardiology

## 2018-04-24 VITALS — BP 112/74 | HR 66 | Ht 69.0 in | Wt 179.8 lb

## 2018-04-24 DIAGNOSIS — I130 Hypertensive heart and chronic kidney disease with heart failure and stage 1 through stage 4 chronic kidney disease, or unspecified chronic kidney disease: Secondary | ICD-10-CM

## 2018-04-24 DIAGNOSIS — I5189 Other ill-defined heart diseases: Secondary | ICD-10-CM

## 2018-04-24 DIAGNOSIS — N183 Chronic kidney disease, stage 3 unspecified: Secondary | ICD-10-CM

## 2018-04-24 DIAGNOSIS — I5043 Acute on chronic combined systolic (congestive) and diastolic (congestive) heart failure: Secondary | ICD-10-CM | POA: Diagnosis not present

## 2018-04-24 DIAGNOSIS — I519 Heart disease, unspecified: Secondary | ICD-10-CM

## 2018-04-24 DIAGNOSIS — F1010 Alcohol abuse, uncomplicated: Secondary | ICD-10-CM

## 2018-04-24 DIAGNOSIS — I5031 Acute diastolic (congestive) heart failure: Secondary | ICD-10-CM

## 2018-04-24 HISTORY — DX: Other ill-defined heart diseases: I51.89

## 2018-04-24 HISTORY — DX: Heart disease, unspecified: I51.9

## 2018-04-24 MED ORDER — FUROSEMIDE 40 MG PO TABS: 40.0000 mg | ORAL_TABLET | Freq: Every day | ORAL | 3 refills | Status: DC

## 2018-04-24 NOTE — Patient Instructions (Addendum)
Medication Instructions:  Your physician has recommended you make the following change in your medication:  INCREASE furosemide (lasix) 40 mg daily  Labwork: None  Testing/Procedures: None  Follow-Up: Your physician wants you to follow-up in: 3 months. You will receive a reminder letter in the mail two months in advance. If you don't receive a letter, please call our office to schedule the follow-up appointment.   If you need a refill on your cardiac medications before your next appointment, please call your pharmacy.   Thank you for choosing [facility]! Robyne Peers, RN [phone]     Heart Failure  Weigh yourself every morning when you first wake up and record on a calender or note pad, bring this to your office visits. Using a pill tender can help with taking your medications consistently.  Limit your fluid intake to 2 liters daily  Limit your sodium intake to less than 2-3 grams daily. Ask if you need dietary teaching.  If you gain more than 3 pounds (from your dry weight ), double your dose of diuretic for the day.  If you gain more than 5 pounds (from your dry weight), double your dose of lasix and call your heart failure doctor.  Please do not smoke tobacco since it is very bad for your heart.  Please do not drink alcohol since it can worsen your heart failure.Also avoid OTC nonsteroidal drugs, such as advil, aleve and motrin.  Try to exercise for at least 30 minutes every day because this will help your heart be more efficient. You may be eligible for supervised cardiac rehab, ask your physician.

## 2018-05-17 DIAGNOSIS — I10 Essential (primary) hypertension: Secondary | ICD-10-CM | POA: Diagnosis not present

## 2018-05-17 DIAGNOSIS — M109 Gout, unspecified: Secondary | ICD-10-CM | POA: Diagnosis not present

## 2018-05-17 DIAGNOSIS — R7301 Impaired fasting glucose: Secondary | ICD-10-CM | POA: Diagnosis not present

## 2018-05-17 DIAGNOSIS — Z23 Encounter for immunization: Secondary | ICD-10-CM | POA: Diagnosis not present

## 2018-05-17 DIAGNOSIS — E785 Hyperlipidemia, unspecified: Secondary | ICD-10-CM | POA: Diagnosis not present

## 2018-05-22 DIAGNOSIS — N401 Enlarged prostate with lower urinary tract symptoms: Secondary | ICD-10-CM | POA: Diagnosis not present

## 2018-05-22 DIAGNOSIS — R972 Elevated prostate specific antigen [PSA]: Secondary | ICD-10-CM | POA: Diagnosis not present

## 2018-10-03 DIAGNOSIS — K5730 Diverticulosis of large intestine without perforation or abscess without bleeding: Secondary | ICD-10-CM | POA: Diagnosis not present

## 2018-10-03 DIAGNOSIS — K591 Functional diarrhea: Secondary | ICD-10-CM | POA: Diagnosis not present

## 2018-10-03 DIAGNOSIS — K219 Gastro-esophageal reflux disease without esophagitis: Secondary | ICD-10-CM | POA: Diagnosis not present

## 2018-10-03 DIAGNOSIS — K921 Melena: Secondary | ICD-10-CM | POA: Diagnosis not present

## 2018-10-23 DIAGNOSIS — Z7902 Long term (current) use of antithrombotics/antiplatelets: Secondary | ICD-10-CM | POA: Diagnosis not present

## 2018-10-23 DIAGNOSIS — C61 Malignant neoplasm of prostate: Secondary | ICD-10-CM | POA: Diagnosis not present

## 2018-10-23 DIAGNOSIS — K5730 Diverticulosis of large intestine without perforation or abscess without bleeding: Secondary | ICD-10-CM | POA: Diagnosis not present

## 2018-10-23 DIAGNOSIS — Z1211 Encounter for screening for malignant neoplasm of colon: Secondary | ICD-10-CM | POA: Diagnosis not present

## 2018-10-23 DIAGNOSIS — M1990 Unspecified osteoarthritis, unspecified site: Secondary | ICD-10-CM | POA: Diagnosis not present

## 2018-10-23 DIAGNOSIS — Z8673 Personal history of transient ischemic attack (TIA), and cerebral infarction without residual deficits: Secondary | ICD-10-CM | POA: Diagnosis not present

## 2018-10-23 DIAGNOSIS — E7800 Pure hypercholesterolemia, unspecified: Secondary | ICD-10-CM | POA: Diagnosis not present

## 2018-10-23 DIAGNOSIS — Z86010 Personal history of colonic polyps: Secondary | ICD-10-CM | POA: Diagnosis not present

## 2018-10-23 DIAGNOSIS — Z09 Encounter for follow-up examination after completed treatment for conditions other than malignant neoplasm: Secondary | ICD-10-CM | POA: Diagnosis not present

## 2018-10-23 DIAGNOSIS — K5750 Diverticulosis of both small and large intestine without perforation or abscess without bleeding: Secondary | ICD-10-CM | POA: Diagnosis not present

## 2018-10-23 DIAGNOSIS — I10 Essential (primary) hypertension: Secondary | ICD-10-CM | POA: Diagnosis not present

## 2018-10-23 DIAGNOSIS — I509 Heart failure, unspecified: Secondary | ICD-10-CM | POA: Diagnosis not present

## 2018-10-23 DIAGNOSIS — Z79899 Other long term (current) drug therapy: Secondary | ICD-10-CM | POA: Diagnosis not present

## 2018-10-23 DIAGNOSIS — F329 Major depressive disorder, single episode, unspecified: Secondary | ICD-10-CM | POA: Diagnosis not present

## 2018-10-23 DIAGNOSIS — F4310 Post-traumatic stress disorder, unspecified: Secondary | ICD-10-CM | POA: Diagnosis not present

## 2018-10-23 DIAGNOSIS — Z87891 Personal history of nicotine dependence: Secondary | ICD-10-CM | POA: Diagnosis not present

## 2018-11-15 DIAGNOSIS — M109 Gout, unspecified: Secondary | ICD-10-CM | POA: Diagnosis not present

## 2018-11-15 DIAGNOSIS — R7301 Impaired fasting glucose: Secondary | ICD-10-CM | POA: Diagnosis not present

## 2018-11-15 DIAGNOSIS — E785 Hyperlipidemia, unspecified: Secondary | ICD-10-CM | POA: Diagnosis not present

## 2018-11-15 DIAGNOSIS — C61 Malignant neoplasm of prostate: Secondary | ICD-10-CM | POA: Diagnosis not present

## 2018-11-15 DIAGNOSIS — I10 Essential (primary) hypertension: Secondary | ICD-10-CM | POA: Diagnosis not present

## 2018-11-19 DIAGNOSIS — E875 Hyperkalemia: Secondary | ICD-10-CM | POA: Diagnosis not present

## 2019-02-05 DIAGNOSIS — Z0000 Encounter for general adult medical examination without abnormal findings: Secondary | ICD-10-CM | POA: Diagnosis not present

## 2019-02-05 DIAGNOSIS — Z125 Encounter for screening for malignant neoplasm of prostate: Secondary | ICD-10-CM | POA: Diagnosis not present

## 2019-02-05 DIAGNOSIS — Z9181 History of falling: Secondary | ICD-10-CM | POA: Diagnosis not present

## 2019-02-05 DIAGNOSIS — E785 Hyperlipidemia, unspecified: Secondary | ICD-10-CM | POA: Diagnosis not present

## 2019-02-05 DIAGNOSIS — Z1331 Encounter for screening for depression: Secondary | ICD-10-CM | POA: Diagnosis not present

## 2019-03-21 DIAGNOSIS — B351 Tinea unguium: Secondary | ICD-10-CM | POA: Insufficient documentation

## 2019-05-20 DIAGNOSIS — M109 Gout, unspecified: Secondary | ICD-10-CM | POA: Diagnosis not present

## 2019-05-20 DIAGNOSIS — R7301 Impaired fasting glucose: Secondary | ICD-10-CM | POA: Diagnosis not present

## 2019-05-20 DIAGNOSIS — Z139 Encounter for screening, unspecified: Secondary | ICD-10-CM | POA: Diagnosis not present

## 2019-05-20 DIAGNOSIS — Z23 Encounter for immunization: Secondary | ICD-10-CM | POA: Diagnosis not present

## 2019-05-20 DIAGNOSIS — I10 Essential (primary) hypertension: Secondary | ICD-10-CM | POA: Diagnosis not present

## 2019-05-20 DIAGNOSIS — C61 Malignant neoplasm of prostate: Secondary | ICD-10-CM | POA: Diagnosis not present

## 2019-05-20 DIAGNOSIS — E785 Hyperlipidemia, unspecified: Secondary | ICD-10-CM | POA: Diagnosis not present

## 2019-07-04 DIAGNOSIS — R05 Cough: Secondary | ICD-10-CM | POA: Diagnosis not present

## 2019-07-04 DIAGNOSIS — R070 Pain in throat: Secondary | ICD-10-CM | POA: Diagnosis not present

## 2019-07-04 DIAGNOSIS — R509 Fever, unspecified: Secondary | ICD-10-CM | POA: Diagnosis not present

## 2019-07-04 DIAGNOSIS — Z7189 Other specified counseling: Secondary | ICD-10-CM | POA: Diagnosis not present

## 2019-07-04 DIAGNOSIS — R0602 Shortness of breath: Secondary | ICD-10-CM | POA: Diagnosis not present

## 2019-09-05 DIAGNOSIS — Z20822 Contact with and (suspected) exposure to covid-19: Secondary | ICD-10-CM | POA: Diagnosis not present

## 2019-09-27 DIAGNOSIS — R0602 Shortness of breath: Secondary | ICD-10-CM | POA: Diagnosis not present

## 2019-09-27 DIAGNOSIS — Z20828 Contact with and (suspected) exposure to other viral communicable diseases: Secondary | ICD-10-CM | POA: Diagnosis not present

## 2019-09-27 DIAGNOSIS — R6883 Chills (without fever): Secondary | ICD-10-CM | POA: Diagnosis not present

## 2019-09-27 DIAGNOSIS — R070 Pain in throat: Secondary | ICD-10-CM | POA: Diagnosis not present

## 2019-10-25 DIAGNOSIS — I361 Nonrheumatic tricuspid (valve) insufficiency: Secondary | ICD-10-CM

## 2019-10-31 DIAGNOSIS — K921 Melena: Secondary | ICD-10-CM | POA: Diagnosis not present

## 2019-10-31 DIAGNOSIS — K5730 Diverticulosis of large intestine without perforation or abscess without bleeding: Secondary | ICD-10-CM | POA: Diagnosis not present

## 2019-10-31 DIAGNOSIS — D509 Iron deficiency anemia, unspecified: Secondary | ICD-10-CM | POA: Diagnosis not present

## 2019-10-31 DIAGNOSIS — K922 Gastrointestinal hemorrhage, unspecified: Secondary | ICD-10-CM | POA: Diagnosis not present

## 2019-11-01 DIAGNOSIS — K627 Radiation proctitis: Secondary | ICD-10-CM | POA: Diagnosis not present

## 2019-11-01 DIAGNOSIS — K635 Polyp of colon: Secondary | ICD-10-CM | POA: Diagnosis not present

## 2019-11-01 DIAGNOSIS — K5730 Diverticulosis of large intestine without perforation or abscess without bleeding: Secondary | ICD-10-CM | POA: Diagnosis not present

## 2019-11-01 DIAGNOSIS — K921 Melena: Secondary | ICD-10-CM | POA: Diagnosis not present

## 2019-11-01 DIAGNOSIS — K5790 Diverticulosis of intestine, part unspecified, without perforation or abscess without bleeding: Secondary | ICD-10-CM | POA: Diagnosis not present

## 2019-11-01 DIAGNOSIS — K922 Gastrointestinal hemorrhage, unspecified: Secondary | ICD-10-CM | POA: Diagnosis not present

## 2019-11-01 DIAGNOSIS — K5750 Diverticulosis of both small and large intestine without perforation or abscess without bleeding: Secondary | ICD-10-CM | POA: Diagnosis not present

## 2019-11-01 DIAGNOSIS — I509 Heart failure, unspecified: Secondary | ICD-10-CM | POA: Diagnosis not present

## 2019-11-01 DIAGNOSIS — D509 Iron deficiency anemia, unspecified: Secondary | ICD-10-CM | POA: Diagnosis not present

## 2019-11-01 DIAGNOSIS — R55 Syncope and collapse: Secondary | ICD-10-CM | POA: Diagnosis not present

## 2019-11-01 DIAGNOSIS — J449 Chronic obstructive pulmonary disease, unspecified: Secondary | ICD-10-CM | POA: Diagnosis not present

## 2019-11-01 DIAGNOSIS — Z8546 Personal history of malignant neoplasm of prostate: Secondary | ICD-10-CM | POA: Diagnosis not present

## 2019-11-02 DIAGNOSIS — D509 Iron deficiency anemia, unspecified: Secondary | ICD-10-CM | POA: Diagnosis not present

## 2019-11-02 DIAGNOSIS — K922 Gastrointestinal hemorrhage, unspecified: Secondary | ICD-10-CM | POA: Diagnosis not present

## 2019-11-02 DIAGNOSIS — K921 Melena: Secondary | ICD-10-CM | POA: Diagnosis not present

## 2019-11-02 DIAGNOSIS — K5790 Diverticulosis of intestine, part unspecified, without perforation or abscess without bleeding: Secondary | ICD-10-CM | POA: Diagnosis not present

## 2019-11-02 DIAGNOSIS — K635 Polyp of colon: Secondary | ICD-10-CM | POA: Diagnosis not present

## 2019-11-02 DIAGNOSIS — K5730 Diverticulosis of large intestine without perforation or abscess without bleeding: Secondary | ICD-10-CM | POA: Diagnosis not present

## 2019-11-02 DIAGNOSIS — J449 Chronic obstructive pulmonary disease, unspecified: Secondary | ICD-10-CM | POA: Diagnosis not present

## 2019-11-02 DIAGNOSIS — R55 Syncope and collapse: Secondary | ICD-10-CM | POA: Diagnosis not present

## 2019-11-02 DIAGNOSIS — I509 Heart failure, unspecified: Secondary | ICD-10-CM | POA: Diagnosis not present

## 2019-11-03 DIAGNOSIS — K921 Melena: Secondary | ICD-10-CM | POA: Diagnosis not present

## 2019-11-03 DIAGNOSIS — K5730 Diverticulosis of large intestine without perforation or abscess without bleeding: Secondary | ICD-10-CM | POA: Diagnosis not present

## 2019-11-03 DIAGNOSIS — K5790 Diverticulosis of intestine, part unspecified, without perforation or abscess without bleeding: Secondary | ICD-10-CM | POA: Diagnosis not present

## 2019-11-03 DIAGNOSIS — J449 Chronic obstructive pulmonary disease, unspecified: Secondary | ICD-10-CM | POA: Diagnosis not present

## 2019-11-03 DIAGNOSIS — I509 Heart failure, unspecified: Secondary | ICD-10-CM | POA: Diagnosis not present

## 2019-11-03 DIAGNOSIS — D509 Iron deficiency anemia, unspecified: Secondary | ICD-10-CM | POA: Diagnosis not present

## 2019-11-03 DIAGNOSIS — K922 Gastrointestinal hemorrhage, unspecified: Secondary | ICD-10-CM | POA: Diagnosis not present

## 2019-11-03 DIAGNOSIS — R55 Syncope and collapse: Secondary | ICD-10-CM | POA: Diagnosis not present

## 2019-11-03 DIAGNOSIS — K635 Polyp of colon: Secondary | ICD-10-CM | POA: Diagnosis not present

## 2019-11-04 DIAGNOSIS — J449 Chronic obstructive pulmonary disease, unspecified: Secondary | ICD-10-CM | POA: Diagnosis not present

## 2019-11-04 DIAGNOSIS — I509 Heart failure, unspecified: Secondary | ICD-10-CM | POA: Diagnosis not present

## 2019-11-04 DIAGNOSIS — K5730 Diverticulosis of large intestine without perforation or abscess without bleeding: Secondary | ICD-10-CM | POA: Diagnosis not present

## 2019-11-04 DIAGNOSIS — K922 Gastrointestinal hemorrhage, unspecified: Secondary | ICD-10-CM | POA: Diagnosis not present

## 2019-11-04 DIAGNOSIS — R55 Syncope and collapse: Secondary | ICD-10-CM | POA: Diagnosis not present

## 2019-11-04 DIAGNOSIS — K921 Melena: Secondary | ICD-10-CM | POA: Diagnosis not present

## 2019-11-04 DIAGNOSIS — K5790 Diverticulosis of intestine, part unspecified, without perforation or abscess without bleeding: Secondary | ICD-10-CM | POA: Diagnosis not present

## 2019-11-04 DIAGNOSIS — K635 Polyp of colon: Secondary | ICD-10-CM | POA: Diagnosis not present

## 2019-11-04 DIAGNOSIS — D509 Iron deficiency anemia, unspecified: Secondary | ICD-10-CM | POA: Diagnosis not present

## 2019-11-08 DIAGNOSIS — K5731 Diverticulosis of large intestine without perforation or abscess with bleeding: Secondary | ICD-10-CM | POA: Diagnosis not present

## 2019-11-08 DIAGNOSIS — R578 Other shock: Secondary | ICD-10-CM | POA: Diagnosis not present

## 2019-11-08 DIAGNOSIS — K922 Gastrointestinal hemorrhage, unspecified: Secondary | ICD-10-CM | POA: Diagnosis not present

## 2019-11-08 DIAGNOSIS — C61 Malignant neoplasm of prostate: Secondary | ICD-10-CM | POA: Diagnosis not present

## 2019-11-08 DIAGNOSIS — J189 Pneumonia, unspecified organism: Secondary | ICD-10-CM | POA: Diagnosis not present

## 2019-11-25 DIAGNOSIS — E785 Hyperlipidemia, unspecified: Secondary | ICD-10-CM | POA: Diagnosis not present

## 2019-11-25 DIAGNOSIS — R7301 Impaired fasting glucose: Secondary | ICD-10-CM | POA: Diagnosis not present

## 2019-11-25 DIAGNOSIS — M109 Gout, unspecified: Secondary | ICD-10-CM | POA: Diagnosis not present

## 2019-11-25 DIAGNOSIS — I10 Essential (primary) hypertension: Secondary | ICD-10-CM | POA: Diagnosis not present

## 2019-11-25 DIAGNOSIS — D62 Acute posthemorrhagic anemia: Secondary | ICD-10-CM | POA: Diagnosis not present

## 2020-03-04 DIAGNOSIS — I10 Essential (primary) hypertension: Secondary | ICD-10-CM | POA: Diagnosis not present

## 2020-03-04 DIAGNOSIS — K922 Gastrointestinal hemorrhage, unspecified: Secondary | ICD-10-CM | POA: Diagnosis not present

## 2020-03-04 DIAGNOSIS — R1013 Epigastric pain: Secondary | ICD-10-CM | POA: Diagnosis not present

## 2020-03-04 DIAGNOSIS — N3289 Other specified disorders of bladder: Secondary | ICD-10-CM | POA: Diagnosis not present

## 2020-03-04 DIAGNOSIS — K5730 Diverticulosis of large intestine without perforation or abscess without bleeding: Secondary | ICD-10-CM | POA: Diagnosis not present

## 2020-03-04 DIAGNOSIS — S2239XA Fracture of one rib, unspecified side, initial encounter for closed fracture: Secondary | ICD-10-CM | POA: Diagnosis not present

## 2020-03-04 DIAGNOSIS — R21 Rash and other nonspecific skin eruption: Secondary | ICD-10-CM | POA: Diagnosis not present

## 2020-03-04 DIAGNOSIS — I5030 Unspecified diastolic (congestive) heart failure: Secondary | ICD-10-CM | POA: Diagnosis not present

## 2020-03-04 DIAGNOSIS — K449 Diaphragmatic hernia without obstruction or gangrene: Secondary | ICD-10-CM | POA: Diagnosis not present

## 2020-03-04 DIAGNOSIS — K625 Hemorrhage of anus and rectum: Secondary | ICD-10-CM | POA: Diagnosis not present

## 2020-03-04 DIAGNOSIS — D649 Anemia, unspecified: Secondary | ICD-10-CM | POA: Diagnosis not present

## 2020-03-04 DIAGNOSIS — I700 Atherosclerosis of aorta: Secondary | ICD-10-CM | POA: Diagnosis not present

## 2020-03-05 ENCOUNTER — Inpatient Hospital Stay
Admission: AD | Admit: 2020-03-05 | Payer: Medicare Other | Source: Other Acute Inpatient Hospital | Admitting: Family Medicine

## 2020-03-05 DIAGNOSIS — K254 Chronic or unspecified gastric ulcer with hemorrhage: Secondary | ICD-10-CM | POA: Diagnosis present

## 2020-03-05 DIAGNOSIS — K5730 Diverticulosis of large intestine without perforation or abscess without bleeding: Secondary | ICD-10-CM | POA: Diagnosis not present

## 2020-03-05 DIAGNOSIS — Z87891 Personal history of nicotine dependence: Secondary | ICD-10-CM | POA: Diagnosis not present

## 2020-03-05 DIAGNOSIS — R58 Hemorrhage, not elsewhere classified: Secondary | ICD-10-CM | POA: Diagnosis not present

## 2020-03-05 DIAGNOSIS — I5030 Unspecified diastolic (congestive) heart failure: Secondary | ICD-10-CM | POA: Diagnosis not present

## 2020-03-05 DIAGNOSIS — F4310 Post-traumatic stress disorder, unspecified: Secondary | ICD-10-CM | POA: Diagnosis present

## 2020-03-05 DIAGNOSIS — R52 Pain, unspecified: Secondary | ICD-10-CM | POA: Diagnosis not present

## 2020-03-05 DIAGNOSIS — D490 Neoplasm of unspecified behavior of digestive system: Secondary | ICD-10-CM | POA: Diagnosis not present

## 2020-03-05 DIAGNOSIS — I700 Atherosclerosis of aorta: Secondary | ICD-10-CM | POA: Diagnosis not present

## 2020-03-05 DIAGNOSIS — N3289 Other specified disorders of bladder: Secondary | ICD-10-CM | POA: Diagnosis not present

## 2020-03-05 DIAGNOSIS — Z7902 Long term (current) use of antithrombotics/antiplatelets: Secondary | ICD-10-CM | POA: Diagnosis not present

## 2020-03-05 DIAGNOSIS — E119 Type 2 diabetes mellitus without complications: Secondary | ICD-10-CM | POA: Diagnosis present

## 2020-03-05 DIAGNOSIS — I517 Cardiomegaly: Secondary | ICD-10-CM | POA: Diagnosis not present

## 2020-03-05 DIAGNOSIS — R1084 Generalized abdominal pain: Secondary | ICD-10-CM | POA: Diagnosis not present

## 2020-03-05 DIAGNOSIS — K222 Esophageal obstruction: Secondary | ICD-10-CM | POA: Diagnosis not present

## 2020-03-05 DIAGNOSIS — I340 Nonrheumatic mitral (valve) insufficiency: Secondary | ICD-10-CM | POA: Diagnosis not present

## 2020-03-05 DIAGNOSIS — R1314 Dysphagia, pharyngoesophageal phase: Secondary | ICD-10-CM | POA: Diagnosis not present

## 2020-03-05 DIAGNOSIS — D62 Acute posthemorrhagic anemia: Secondary | ICD-10-CM | POA: Diagnosis present

## 2020-03-05 DIAGNOSIS — R21 Rash and other nonspecific skin eruption: Secondary | ICD-10-CM | POA: Diagnosis not present

## 2020-03-05 DIAGNOSIS — Z8673 Personal history of transient ischemic attack (TIA), and cerebral infarction without residual deficits: Secondary | ICD-10-CM | POA: Diagnosis not present

## 2020-03-05 DIAGNOSIS — K2950 Unspecified chronic gastritis without bleeding: Secondary | ICD-10-CM | POA: Diagnosis not present

## 2020-03-05 DIAGNOSIS — I361 Nonrheumatic tricuspid (valve) insufficiency: Secondary | ICD-10-CM | POA: Diagnosis not present

## 2020-03-05 DIAGNOSIS — K449 Diaphragmatic hernia without obstruction or gangrene: Secondary | ICD-10-CM | POA: Diagnosis not present

## 2020-03-05 DIAGNOSIS — K922 Gastrointestinal hemorrhage, unspecified: Secondary | ICD-10-CM | POA: Diagnosis not present

## 2020-03-05 DIAGNOSIS — I472 Ventricular tachycardia: Secondary | ICD-10-CM | POA: Diagnosis not present

## 2020-03-05 DIAGNOSIS — R1013 Epigastric pain: Secondary | ICD-10-CM | POA: Diagnosis not present

## 2020-03-05 DIAGNOSIS — C61 Malignant neoplasm of prostate: Secondary | ICD-10-CM | POA: Diagnosis present

## 2020-03-05 DIAGNOSIS — R0902 Hypoxemia: Secondary | ICD-10-CM | POA: Diagnosis not present

## 2020-03-05 DIAGNOSIS — K259 Gastric ulcer, unspecified as acute or chronic, without hemorrhage or perforation: Secondary | ICD-10-CM | POA: Diagnosis not present

## 2020-03-05 DIAGNOSIS — I10 Essential (primary) hypertension: Secondary | ICD-10-CM | POA: Diagnosis not present

## 2020-03-05 DIAGNOSIS — I272 Pulmonary hypertension, unspecified: Secondary | ICD-10-CM | POA: Diagnosis present

## 2020-03-05 DIAGNOSIS — Z8719 Personal history of other diseases of the digestive system: Secondary | ICD-10-CM | POA: Diagnosis not present

## 2020-03-05 DIAGNOSIS — J449 Chronic obstructive pulmonary disease, unspecified: Secondary | ICD-10-CM | POA: Diagnosis not present

## 2020-03-05 DIAGNOSIS — K921 Melena: Secondary | ICD-10-CM | POA: Diagnosis not present

## 2020-03-05 DIAGNOSIS — K219 Gastro-esophageal reflux disease without esophagitis: Secondary | ICD-10-CM | POA: Diagnosis not present

## 2020-03-06 DIAGNOSIS — K921 Melena: Secondary | ICD-10-CM | POA: Diagnosis not present

## 2020-03-06 DIAGNOSIS — D490 Neoplasm of unspecified behavior of digestive system: Secondary | ICD-10-CM | POA: Diagnosis not present

## 2020-03-06 DIAGNOSIS — K449 Diaphragmatic hernia without obstruction or gangrene: Secondary | ICD-10-CM | POA: Diagnosis not present

## 2020-03-06 DIAGNOSIS — K5730 Diverticulosis of large intestine without perforation or abscess without bleeding: Secondary | ICD-10-CM | POA: Diagnosis not present

## 2020-03-06 DIAGNOSIS — K222 Esophageal obstruction: Secondary | ICD-10-CM | POA: Diagnosis not present

## 2020-03-16 DIAGNOSIS — D62 Acute posthemorrhagic anemia: Secondary | ICD-10-CM | POA: Diagnosis not present

## 2020-03-16 DIAGNOSIS — K922 Gastrointestinal hemorrhage, unspecified: Secondary | ICD-10-CM | POA: Diagnosis not present

## 2020-03-16 DIAGNOSIS — K270 Acute peptic ulcer, site unspecified, with hemorrhage: Secondary | ICD-10-CM | POA: Diagnosis not present

## 2020-04-20 DIAGNOSIS — K922 Gastrointestinal hemorrhage, unspecified: Secondary | ICD-10-CM | POA: Diagnosis not present

## 2020-08-17 DIAGNOSIS — E039 Hypothyroidism, unspecified: Secondary | ICD-10-CM | POA: Diagnosis not present

## 2020-08-17 DIAGNOSIS — I10 Essential (primary) hypertension: Secondary | ICD-10-CM | POA: Diagnosis not present

## 2020-08-17 DIAGNOSIS — D62 Acute posthemorrhagic anemia: Secondary | ICD-10-CM | POA: Diagnosis not present

## 2020-08-17 DIAGNOSIS — E785 Hyperlipidemia, unspecified: Secondary | ICD-10-CM | POA: Diagnosis not present

## 2020-08-17 DIAGNOSIS — M109 Gout, unspecified: Secondary | ICD-10-CM | POA: Diagnosis not present

## 2020-08-17 DIAGNOSIS — R7301 Impaired fasting glucose: Secondary | ICD-10-CM | POA: Diagnosis not present

## 2020-08-17 DIAGNOSIS — J449 Chronic obstructive pulmonary disease, unspecified: Secondary | ICD-10-CM | POA: Diagnosis not present

## 2020-11-19 DIAGNOSIS — M109 Gout, unspecified: Secondary | ICD-10-CM | POA: Diagnosis not present

## 2020-11-19 DIAGNOSIS — E039 Hypothyroidism, unspecified: Secondary | ICD-10-CM | POA: Diagnosis not present

## 2020-11-19 DIAGNOSIS — E785 Hyperlipidemia, unspecified: Secondary | ICD-10-CM | POA: Diagnosis not present

## 2020-11-19 DIAGNOSIS — Z125 Encounter for screening for malignant neoplasm of prostate: Secondary | ICD-10-CM | POA: Diagnosis not present

## 2020-11-19 DIAGNOSIS — R7301 Impaired fasting glucose: Secondary | ICD-10-CM | POA: Diagnosis not present

## 2020-11-19 DIAGNOSIS — Z6828 Body mass index (BMI) 28.0-28.9, adult: Secondary | ICD-10-CM | POA: Diagnosis not present

## 2020-11-19 DIAGNOSIS — D62 Acute posthemorrhagic anemia: Secondary | ICD-10-CM | POA: Diagnosis not present

## 2020-11-19 DIAGNOSIS — I10 Essential (primary) hypertension: Secondary | ICD-10-CM | POA: Diagnosis not present

## 2020-11-19 DIAGNOSIS — J449 Chronic obstructive pulmonary disease, unspecified: Secondary | ICD-10-CM | POA: Diagnosis not present

## 2021-02-18 DIAGNOSIS — E785 Hyperlipidemia, unspecified: Secondary | ICD-10-CM | POA: Diagnosis not present

## 2021-02-18 DIAGNOSIS — D62 Acute posthemorrhagic anemia: Secondary | ICD-10-CM | POA: Diagnosis not present

## 2021-02-18 DIAGNOSIS — E039 Hypothyroidism, unspecified: Secondary | ICD-10-CM | POA: Diagnosis not present

## 2021-02-18 DIAGNOSIS — J449 Chronic obstructive pulmonary disease, unspecified: Secondary | ICD-10-CM | POA: Diagnosis not present

## 2021-02-18 DIAGNOSIS — M109 Gout, unspecified: Secondary | ICD-10-CM | POA: Diagnosis not present

## 2021-02-18 DIAGNOSIS — Z9181 History of falling: Secondary | ICD-10-CM | POA: Diagnosis not present

## 2021-02-18 DIAGNOSIS — Z139 Encounter for screening, unspecified: Secondary | ICD-10-CM | POA: Diagnosis not present

## 2021-02-18 DIAGNOSIS — R7301 Impaired fasting glucose: Secondary | ICD-10-CM | POA: Diagnosis not present

## 2021-02-18 DIAGNOSIS — I10 Essential (primary) hypertension: Secondary | ICD-10-CM | POA: Diagnosis not present

## 2021-04-06 DIAGNOSIS — Z0000 Encounter for general adult medical examination without abnormal findings: Secondary | ICD-10-CM | POA: Diagnosis not present

## 2021-04-06 DIAGNOSIS — Z1331 Encounter for screening for depression: Secondary | ICD-10-CM | POA: Diagnosis not present

## 2021-04-06 DIAGNOSIS — E785 Hyperlipidemia, unspecified: Secondary | ICD-10-CM | POA: Diagnosis not present

## 2021-04-06 DIAGNOSIS — Z9181 History of falling: Secondary | ICD-10-CM | POA: Diagnosis not present

## 2021-07-09 ENCOUNTER — Other Ambulatory Visit (HOSPITAL_BASED_OUTPATIENT_CLINIC_OR_DEPARTMENT_OTHER): Payer: Self-pay

## 2021-07-09 DIAGNOSIS — G4730 Sleep apnea, unspecified: Secondary | ICD-10-CM

## 2021-07-21 ENCOUNTER — Emergency Department (HOSPITAL_COMMUNITY): Payer: No Typology Code available for payment source

## 2021-07-21 ENCOUNTER — Encounter (HOSPITAL_COMMUNITY): Payer: Self-pay | Admitting: *Deleted

## 2021-07-21 ENCOUNTER — Inpatient Hospital Stay (HOSPITAL_COMMUNITY)
Admission: EM | Admit: 2021-07-21 | Discharge: 2021-07-25 | DRG: 871 | Disposition: A | Discharge status: Home / Self Care | Payer: No Typology Code available for payment source | Attending: Internal Medicine | Admitting: Internal Medicine

## 2021-07-21 ENCOUNTER — Other Ambulatory Visit: Payer: Self-pay

## 2021-07-21 DIAGNOSIS — Z8673 Personal history of transient ischemic attack (TIA), and cerebral infarction without residual deficits: Secondary | ICD-10-CM | POA: Diagnosis not present

## 2021-07-21 DIAGNOSIS — J441 Chronic obstructive pulmonary disease with (acute) exacerbation: Secondary | ICD-10-CM

## 2021-07-21 DIAGNOSIS — J189 Pneumonia, unspecified organism: Secondary | ICD-10-CM | POA: Diagnosis present

## 2021-07-21 DIAGNOSIS — E875 Hyperkalemia: Secondary | ICD-10-CM

## 2021-07-21 DIAGNOSIS — I130 Hypertensive heart and chronic kidney disease with heart failure and stage 1 through stage 4 chronic kidney disease, or unspecified chronic kidney disease: Secondary | ICD-10-CM | POA: Diagnosis present

## 2021-07-21 DIAGNOSIS — J9690 Respiratory failure, unspecified, unspecified whether with hypoxia or hypercapnia: Secondary | ICD-10-CM

## 2021-07-21 DIAGNOSIS — N400 Enlarged prostate without lower urinary tract symptoms: Secondary | ICD-10-CM | POA: Diagnosis present

## 2021-07-21 DIAGNOSIS — R7989 Other specified abnormal findings of blood chemistry: Secondary | ICD-10-CM | POA: Diagnosis present

## 2021-07-21 DIAGNOSIS — R0902 Hypoxemia: Secondary | ICD-10-CM | POA: Diagnosis not present

## 2021-07-21 DIAGNOSIS — I5043 Acute on chronic combined systolic (congestive) and diastolic (congestive) heart failure: Secondary | ICD-10-CM | POA: Diagnosis present

## 2021-07-21 DIAGNOSIS — I509 Heart failure, unspecified: Secondary | ICD-10-CM

## 2021-07-21 DIAGNOSIS — F1010 Alcohol abuse, uncomplicated: Secondary | ICD-10-CM | POA: Diagnosis present

## 2021-07-21 DIAGNOSIS — H9190 Unspecified hearing loss, unspecified ear: Secondary | ICD-10-CM | POA: Diagnosis present

## 2021-07-21 DIAGNOSIS — A419 Sepsis, unspecified organism: Principal | ICD-10-CM | POA: Diagnosis present

## 2021-07-21 DIAGNOSIS — I5030 Unspecified diastolic (congestive) heart failure: Secondary | ICD-10-CM | POA: Diagnosis not present

## 2021-07-21 DIAGNOSIS — Z885 Allergy status to narcotic agent status: Secondary | ICD-10-CM

## 2021-07-21 DIAGNOSIS — J9601 Acute respiratory failure with hypoxia: Secondary | ICD-10-CM | POA: Diagnosis present

## 2021-07-21 DIAGNOSIS — R778 Other specified abnormalities of plasma proteins: Secondary | ICD-10-CM

## 2021-07-21 DIAGNOSIS — I248 Other forms of acute ischemic heart disease: Secondary | ICD-10-CM | POA: Diagnosis not present

## 2021-07-21 DIAGNOSIS — J80 Acute respiratory distress syndrome: Secondary | ICD-10-CM | POA: Diagnosis not present

## 2021-07-21 DIAGNOSIS — R6520 Severe sepsis without septic shock: Secondary | ICD-10-CM | POA: Diagnosis present

## 2021-07-21 DIAGNOSIS — F4310 Post-traumatic stress disorder, unspecified: Secondary | ICD-10-CM | POA: Diagnosis present

## 2021-07-21 DIAGNOSIS — N1831 Chronic kidney disease, stage 3a: Secondary | ICD-10-CM | POA: Diagnosis present

## 2021-07-21 DIAGNOSIS — Z8249 Family history of ischemic heart disease and other diseases of the circulatory system: Secondary | ICD-10-CM

## 2021-07-21 DIAGNOSIS — R231 Pallor: Secondary | ICD-10-CM | POA: Diagnosis not present

## 2021-07-21 DIAGNOSIS — N183 Chronic kidney disease, stage 3 unspecified: Secondary | ICD-10-CM | POA: Diagnosis present

## 2021-07-21 DIAGNOSIS — Z66 Do not resuscitate: Secondary | ICD-10-CM | POA: Diagnosis present

## 2021-07-21 DIAGNOSIS — I160 Hypertensive urgency: Secondary | ICD-10-CM | POA: Diagnosis present

## 2021-07-21 DIAGNOSIS — N179 Acute kidney failure, unspecified: Secondary | ICD-10-CM | POA: Diagnosis present

## 2021-07-21 DIAGNOSIS — Z87891 Personal history of nicotine dependence: Secondary | ICD-10-CM

## 2021-07-21 DIAGNOSIS — E785 Hyperlipidemia, unspecified: Secondary | ICD-10-CM | POA: Diagnosis present

## 2021-07-21 DIAGNOSIS — Z886 Allergy status to analgesic agent status: Secondary | ICD-10-CM

## 2021-07-21 DIAGNOSIS — Z825 Family history of asthma and other chronic lower respiratory diseases: Secondary | ICD-10-CM

## 2021-07-21 DIAGNOSIS — J9600 Acute respiratory failure, unspecified whether with hypoxia or hypercapnia: Secondary | ICD-10-CM

## 2021-07-21 DIAGNOSIS — Z8701 Personal history of pneumonia (recurrent): Secondary | ICD-10-CM

## 2021-07-21 DIAGNOSIS — J440 Chronic obstructive pulmonary disease with acute lower respiratory infection: Secondary | ICD-10-CM | POA: Diagnosis present

## 2021-07-21 DIAGNOSIS — I5021 Acute systolic (congestive) heart failure: Secondary | ICD-10-CM | POA: Diagnosis not present

## 2021-07-21 DIAGNOSIS — G250 Essential tremor: Secondary | ICD-10-CM | POA: Diagnosis present

## 2021-07-21 DIAGNOSIS — Z20822 Contact with and (suspected) exposure to covid-19: Secondary | ICD-10-CM | POA: Diagnosis present

## 2021-07-21 DIAGNOSIS — G629 Polyneuropathy, unspecified: Secondary | ICD-10-CM | POA: Diagnosis present

## 2021-07-21 DIAGNOSIS — Z79899 Other long term (current) drug therapy: Secondary | ICD-10-CM

## 2021-07-21 DIAGNOSIS — R0689 Other abnormalities of breathing: Secondary | ICD-10-CM | POA: Diagnosis not present

## 2021-07-21 HISTORY — DX: Hypertensive urgency: I16.0

## 2021-07-21 HISTORY — DX: Hyperkalemia: E87.5

## 2021-07-21 HISTORY — DX: Other specified abnormal findings of blood chemistry: R79.89

## 2021-07-21 HISTORY — DX: Sepsis, unspecified organism: A41.9

## 2021-07-21 HISTORY — DX: Chronic obstructive pulmonary disease with (acute) exacerbation: J44.1

## 2021-07-21 HISTORY — DX: Acute respiratory failure, unspecified whether with hypoxia or hypercapnia: J96.00

## 2021-07-21 LAB — I-STAT VENOUS BLOOD GAS, ED
Acid-Base Excess: 1 mmol/L (ref 0.0–2.0)
Bicarbonate: 27.6 mmol/L (ref 20.0–28.0)
Calcium, Ion: 1.16 mmol/L (ref 1.15–1.40)
HCT: 50 % (ref 39.0–52.0)
Hemoglobin: 17 g/dL (ref 13.0–17.0)
O2 Saturation: 48 %
Potassium: 5.9 mmol/L — ABNORMAL HIGH (ref 3.5–5.1)
Sodium: 143 mmol/L (ref 135–145)
TCO2: 29 mmol/L (ref 22–32)
pCO2, Ven: 51.5 mmHg (ref 44.0–60.0)
pH, Ven: 7.337 (ref 7.250–7.430)
pO2, Ven: 28 mmHg — CL (ref 32.0–45.0)

## 2021-07-21 LAB — TROPONIN I (HIGH SENSITIVITY)
Troponin I (High Sensitivity): 46 ng/L — ABNORMAL HIGH (ref <18)
Troponin I (High Sensitivity): 45 ng/L — ABNORMAL HIGH (ref <18)

## 2021-07-21 LAB — I-STAT CHEM 8, ED
BUN: 26 mg/dL — ABNORMAL HIGH (ref 8–23)
Calcium, Ion: 1.17 mmol/L (ref 1.15–1.40)
Chloride: 108 mmol/L (ref 98–111)
Creatinine, Ser: 1.3 mg/dL — ABNORMAL HIGH (ref 0.61–1.24)
Glucose, Bld: 132 mg/dL — ABNORMAL HIGH (ref 70–99)
HCT: 51 % (ref 39.0–52.0)
Hemoglobin: 17.3 g/dL — ABNORMAL HIGH (ref 13.0–17.0)
Potassium: 5.9 mmol/L — ABNORMAL HIGH (ref 3.5–5.1)
Sodium: 143 mmol/L (ref 135–145)
TCO2: 26 mmol/L (ref 22–32)

## 2021-07-21 LAB — CBC WITH DIFFERENTIAL/PLATELET
Abs Immature Granulocytes: 0.1 10*3/uL — ABNORMAL HIGH (ref 0.00–0.07)
Basophils Absolute: 0.1 10*3/uL (ref 0.0–0.1)
Basophils Relative: 1 %
Eosinophils Absolute: 0.1 10*3/uL (ref 0.0–0.5)
Eosinophils Relative: 1 %
HCT: 50.5 % (ref 39.0–52.0)
Hemoglobin: 16.4 g/dL (ref 13.0–17.0)
Immature Granulocytes: 1 %
Lymphocytes Relative: 9 %
Lymphs Abs: 1.5 10*3/uL (ref 0.7–4.0)
MCH: 31.7 pg (ref 26.0–34.0)
MCHC: 32.5 g/dL (ref 30.0–36.0)
MCV: 97.5 fL (ref 80.0–100.0)
Monocytes Absolute: 1 10*3/uL (ref 0.1–1.0)
Monocytes Relative: 6 %
Neutro Abs: 13.1 10*3/uL — ABNORMAL HIGH (ref 1.7–7.7)
Neutrophils Relative %: 82 %
Platelets: 187 10*3/uL (ref 150–400)
RBC: 5.18 MIL/uL (ref 4.22–5.81)
RDW: 13.3 % (ref 11.5–15.5)
WBC: 15.9 10*3/uL — ABNORMAL HIGH (ref 4.0–10.5)
nRBC: 0 % (ref 0.0–0.2)

## 2021-07-21 LAB — LACTIC ACID, PLASMA
Lactic Acid, Venous: 2.2 mmol/L (ref 0.5–1.9)
Lactic Acid, Venous: 2.7 mmol/L (ref 0.5–1.9)
Lactic Acid, Venous: 1.9 mmol/L (ref 0.5–1.9)
Lactic Acid, Venous: 1.9 mmol/L (ref 0.5–1.9)

## 2021-07-21 LAB — COMPREHENSIVE METABOLIC PANEL
ALT: 23 U/L (ref 0–44)
AST: 39 U/L (ref 15–41)
Albumin: 4.2 g/dL (ref 3.5–5.0)
Alkaline Phosphatase: 79 U/L (ref 38–126)
Anion gap: 11 (ref 5–15)
BUN: 22 mg/dL (ref 8–23)
CO2: 22 mmol/L (ref 22–32)
Calcium: 9.6 mg/dL (ref 8.9–10.3)
Chloride: 107 mmol/L (ref 98–111)
Creatinine, Ser: 1.32 mg/dL — ABNORMAL HIGH (ref 0.61–1.24)
GFR, Estimated: 56 mL/min — ABNORMAL LOW (ref >60)
Glucose, Bld: 134 mg/dL — ABNORMAL HIGH (ref 70–99)
Potassium: 5.9 mmol/L — ABNORMAL HIGH (ref 3.5–5.1)
Sodium: 140 mmol/L (ref 135–145)
Total Bilirubin: 1.2 mg/dL (ref 0.3–1.2)
Total Protein: 7.6 g/dL (ref 6.5–8.1)

## 2021-07-21 LAB — D-DIMER, QUANTITATIVE: D-Dimer, Quant: 1.64 ug/mL-FEU — ABNORMAL HIGH (ref 0.00–0.50)

## 2021-07-21 LAB — POTASSIUM: Potassium: 5.4 mmol/L — ABNORMAL HIGH (ref 3.5–5.1)

## 2021-07-21 LAB — BRAIN NATRIURETIC PEPTIDE: B Natriuretic Peptide: 571.5 pg/mL — ABNORMAL HIGH (ref 0.0–100.0)

## 2021-07-21 LAB — RESP PANEL BY RT-PCR (FLU A&B, COVID) ARPGX2
Influenza A by PCR: NEGATIVE
Influenza B by PCR: NEGATIVE
SARS Coronavirus 2 by RT PCR: NEGATIVE

## 2021-07-21 LAB — ETHANOL: Alcohol, Ethyl (B): <10 mg/dL (ref <10)

## 2021-07-21 LAB — PHOSPHORUS: Phosphorus: 3.3 mg/dL (ref 2.5–4.6)

## 2021-07-21 LAB — TSH: TSH: 2.519 u[IU]/mL (ref 0.350–4.500)

## 2021-07-21 LAB — MAGNESIUM: Magnesium: 1.8 mg/dL (ref 1.7–2.4)

## 2021-07-21 LAB — LIPASE, BLOOD: Lipase: 47 U/L (ref 11–51)

## 2021-07-21 MED ORDER — LORAZEPAM 2 MG/ML IJ SOLN: 0.0000 mg | Freq: Four times a day (QID) | INTRAMUSCULAR | Status: DC

## 2021-07-21 MED ORDER — TOBRAMYCIN-DEXAMETHASONE 0.3-0.1 % OP OINT: 1.0000 "application " | TOPICAL_OINTMENT | Freq: Every evening | OPHTHALMIC | Status: DC | PRN

## 2021-07-21 MED ORDER — TIOTROPIUM BROMIDE MONOHYDRATE 1.25 MCG/ACT IN AERS: 2.0000 | INHALATION_SPRAY | Freq: Every day | RESPIRATORY_TRACT | Status: DC

## 2021-07-21 MED ORDER — NITROGLYCERIN IN D5W 200-5 MCG/ML-% IV SOLN
0.0000 ug/min | INTRAVENOUS | Status: DC
  Administered 2021-07-21: 60 ug/min via INTRAVENOUS
  Administered 2021-07-21: 10 ug/min via INTRAVENOUS
  Administered 2021-07-24: 16.667 ug/min via INTRAVENOUS
  Filled 2021-07-21 (×5): qty 250

## 2021-07-21 MED ORDER — POLYVINYL ALCOHOL 1.4 % OP SOLN: 1.0000 [drp] | OPHTHALMIC | Status: DC | PRN

## 2021-07-21 MED ORDER — FUROSEMIDE 10 MG/ML IJ SOLN
40.0000 mg | Freq: Two times a day (BID) | INTRAMUSCULAR | Status: DC
  Administered 2021-07-21 – 2021-07-22 (×2): 40 mg via INTRAVENOUS
  Filled 2021-07-21 (×2): qty 4

## 2021-07-21 MED ORDER — ONDANSETRON HCL 4 MG/2ML IJ SOLN: 4.0000 mg | Freq: Four times a day (QID) | INTRAMUSCULAR | Status: DC | PRN

## 2021-07-21 MED ORDER — LORAZEPAM 1 MG PO TABS: 1.0000 mg | ORAL_TABLET | ORAL | Status: AC | PRN

## 2021-07-21 MED ORDER — ADULT MULTIVITAMIN W/MINERALS CH
1.0000 | ORAL_TABLET | Freq: Every day | ORAL | Status: DC
  Administered 2021-07-21 – 2021-07-25 (×5): 1 via ORAL
  Filled 2021-07-21 (×5): qty 1

## 2021-07-21 MED ORDER — TAMSULOSIN HCL 0.4 MG PO CAPS
0.4000 mg | ORAL_CAPSULE | Freq: Every evening | ORAL | Status: DC
  Administered 2021-07-21 – 2021-07-24 (×4): 0.4 mg via ORAL
  Filled 2021-07-21 (×4): qty 1

## 2021-07-21 MED ORDER — ALBUTEROL SULFATE (2.5 MG/3ML) 0.083% IN NEBU
10.0000 mg/h | INHALATION_SOLUTION | Freq: Once | RESPIRATORY_TRACT | Status: AC
  Administered 2021-07-21: 10 mg/h via RESPIRATORY_TRACT
  Filled 2021-07-21: qty 12

## 2021-07-21 MED ORDER — ONDANSETRON HCL 4 MG PO TABS: 4.0000 mg | ORAL_TABLET | Freq: Four times a day (QID) | ORAL | Status: DC | PRN

## 2021-07-21 MED ORDER — LORAZEPAM 2 MG/ML IJ SOLN
1.0000 mg | INTRAMUSCULAR | Status: AC | PRN
  Administered 2021-07-21: 1 mg via INTRAVENOUS
  Filled 2021-07-21: qty 1

## 2021-07-21 MED ORDER — ISOSORBIDE MONONITRATE ER 60 MG PO TB24
60.0000 mg | ORAL_TABLET | Freq: Every day | ORAL | Status: DC
  Administered 2021-07-22 – 2021-07-25 (×4): 60 mg via ORAL
  Filled 2021-07-21: qty 2
  Filled 2021-07-21 (×3): qty 1

## 2021-07-21 MED ORDER — ONDANSETRON HCL 4 MG/2ML IJ SOLN
4.0000 mg | Freq: Once | INTRAMUSCULAR | Status: AC
  Administered 2021-07-21: 4 mg via INTRAVENOUS
  Filled 2021-07-21: qty 2

## 2021-07-21 MED ORDER — SODIUM ZIRCONIUM CYCLOSILICATE 10 G PO PACK
10.0000 g | PACK | Freq: Once | ORAL | Status: AC
  Administered 2021-07-21: 10 g via ORAL
  Filled 2021-07-21: qty 1

## 2021-07-21 MED ORDER — THIAMINE HCL 100 MG PO TABS
100.0000 mg | ORAL_TABLET | Freq: Every day | ORAL | Status: DC
  Administered 2021-07-21 – 2021-07-25 (×5): 100 mg via ORAL
  Filled 2021-07-21 (×5): qty 1

## 2021-07-21 MED ORDER — SODIUM CHLORIDE 0.9% FLUSH
3.0000 mL | Freq: Two times a day (BID) | INTRAVENOUS | Status: DC
  Administered 2021-07-21 – 2021-07-25 (×7): 3 mL via INTRAVENOUS

## 2021-07-21 MED ORDER — SIMVASTATIN 20 MG PO TABS
20.0000 mg | ORAL_TABLET | Freq: Every evening | ORAL | Status: DC
  Administered 2021-07-21 – 2021-07-24 (×4): 20 mg via ORAL
  Filled 2021-07-21 (×4): qty 1

## 2021-07-21 MED ORDER — FUROSEMIDE 10 MG/ML IJ SOLN
40.0000 mg | Freq: Once | INTRAMUSCULAR | Status: AC
  Administered 2021-07-21: 40 mg via INTRAVENOUS
  Filled 2021-07-21: qty 4

## 2021-07-21 MED ORDER — ALBUTEROL SULFATE (2.5 MG/3ML) 0.083% IN NEBU: 2.5000 mg | INHALATION_SOLUTION | Freq: Four times a day (QID) | RESPIRATORY_TRACT | Status: DC | PRN

## 2021-07-21 MED ORDER — THIAMINE HCL 100 MG/ML IJ SOLN
100.0000 mg | Freq: Every day | INTRAMUSCULAR | Status: DC
  Filled 2021-07-21: qty 2

## 2021-07-21 MED ORDER — SODIUM CHLORIDE 0.9 % IV SOLN
1.0000 g | Freq: Once | INTRAVENOUS | Status: AC
  Administered 2021-07-21: 1 g via INTRAVENOUS
  Filled 2021-07-21: qty 10

## 2021-07-21 MED ORDER — PANTOPRAZOLE SODIUM 40 MG IV SOLR
40.0000 mg | Freq: Two times a day (BID) | INTRAVENOUS | Status: DC
  Administered 2021-07-21 – 2021-07-23 (×5): 40 mg via INTRAVENOUS
  Filled 2021-07-21 (×5): qty 40

## 2021-07-21 MED ORDER — FOLIC ACID 1 MG PO TABS
1.0000 mg | ORAL_TABLET | Freq: Every day | ORAL | Status: DC
  Administered 2021-07-21 – 2021-07-25 (×5): 1 mg via ORAL
  Filled 2021-07-21 (×5): qty 1

## 2021-07-21 MED ORDER — ONDANSETRON HCL 4 MG/2ML IJ SOLN
INTRAMUSCULAR | Status: AC
  Filled 2021-07-21: qty 2

## 2021-07-21 MED ORDER — ISOSORBIDE MONONITRATE ER 30 MG PO TB24: 60.0000 mg | ORAL_TABLET | Freq: Every day | ORAL | Status: DC

## 2021-07-21 MED ORDER — ENOXAPARIN SODIUM 40 MG/0.4ML IJ SOSY
40.0000 mg | PREFILLED_SYRINGE | INTRAMUSCULAR | Status: DC
  Administered 2021-07-21 – 2021-07-25 (×5): 40 mg via SUBCUTANEOUS
  Filled 2021-07-21 (×5): qty 0.4

## 2021-07-21 MED ORDER — ACETAMINOPHEN 650 MG RE SUPP: 650.0000 mg | Freq: Four times a day (QID) | RECTAL | Status: DC | PRN

## 2021-07-21 MED ORDER — LORATADINE 10 MG PO TABS: 10.0000 mg | ORAL_TABLET | Freq: Every day | ORAL | Status: DC | PRN

## 2021-07-21 MED ORDER — ALBUTEROL SULFATE (2.5 MG/3ML) 0.083% IN NEBU: 2.5000 mg | INHALATION_SOLUTION | RESPIRATORY_TRACT | Status: DC | PRN

## 2021-07-21 MED ORDER — LORAZEPAM 2 MG/ML IJ SOLN: 0.0000 mg | Freq: Two times a day (BID) | INTRAMUSCULAR | Status: DC

## 2021-07-21 MED ORDER — SODIUM CHLORIDE 0.9 % IV SOLN
2.0000 g | INTRAVENOUS | Status: DC
  Administered 2021-07-22 – 2021-07-25 (×4): 2 g via INTRAVENOUS
  Filled 2021-07-21 (×4): qty 20

## 2021-07-21 MED ORDER — PROPRANOLOL HCL ER 60 MG PO CP24
120.0000 mg | ORAL_CAPSULE | Freq: Every day | ORAL | Status: DC
  Administered 2021-07-22 – 2021-07-25 (×4): 120 mg via ORAL
  Filled 2021-07-21: qty 1
  Filled 2021-07-21 (×3): qty 2
  Filled 2021-07-21 (×2): qty 1
  Filled 2021-07-21: qty 2

## 2021-07-21 MED ORDER — ACETAMINOPHEN 325 MG PO TABS: 650.0000 mg | ORAL_TABLET | Freq: Four times a day (QID) | ORAL | Status: DC | PRN

## 2021-07-21 MED ORDER — IPRATROPIUM-ALBUTEROL 0.5-2.5 (3) MG/3ML IN SOLN
3.0000 mL | Freq: Four times a day (QID) | RESPIRATORY_TRACT | Status: DC
  Administered 2021-07-21 – 2021-07-25 (×14): 3 mL via RESPIRATORY_TRACT
  Filled 2021-07-21 (×15): qty 3

## 2021-07-21 MED ORDER — ONDANSETRON HCL 4 MG/2ML IJ SOLN
4.0000 mg | Freq: Once | INTRAMUSCULAR | Status: AC
  Administered 2021-07-21: 4 mg via INTRAVENOUS

## 2021-07-21 MED ORDER — CLOPIDOGREL BISULFATE 75 MG PO TABS
75.0000 mg | ORAL_TABLET | Freq: Every day | ORAL | Status: DC
  Administered 2021-07-21 – 2021-07-25 (×5): 75 mg via ORAL
  Filled 2021-07-21 (×5): qty 1

## 2021-07-21 MED ORDER — METHYLPREDNISOLONE SODIUM SUCC 125 MG IJ SOLR
80.0000 mg | Freq: Every day | INTRAMUSCULAR | Status: DC
  Administered 2021-07-21 – 2021-07-23 (×3): 80 mg via INTRAVENOUS
  Filled 2021-07-21 (×3): qty 2

## 2021-07-21 MED ORDER — GABAPENTIN 300 MG PO CAPS
300.0000 mg | ORAL_CAPSULE | Freq: Three times a day (TID) | ORAL | Status: DC
  Administered 2021-07-21 – 2021-07-25 (×13): 300 mg via ORAL
  Filled 2021-07-21 (×13): qty 1

## 2021-07-21 MED ORDER — HYDRALAZINE HCL 20 MG/ML IJ SOLN
10.0000 mg | INTRAMUSCULAR | Status: DC | PRN
  Administered 2021-07-21: 10 mg via INTRAVENOUS
  Filled 2021-07-21: qty 1

## 2021-07-21 MED ORDER — SODIUM CHLORIDE 0.9 % IV SOLN
1.0000 g | INTRAVENOUS | Status: DC
  Administered 2021-07-21: 1 g via INTRAVENOUS
  Filled 2021-07-21: qty 10

## 2021-07-21 MED ORDER — CARBOXYMETHYLCELLULOSE SOD PF 0.25 % OP SOLN: 15.0000 mL | Freq: Four times a day (QID) | OPHTHALMIC | Status: DC | PRN

## 2021-07-21 MED ORDER — ALLOPURINOL 300 MG PO TABS
150.0000 mg | ORAL_TABLET | Freq: Every day | ORAL | Status: DC
  Administered 2021-07-21 – 2021-07-25 (×5): 150 mg via ORAL
  Filled 2021-07-21: qty 1
  Filled 2021-07-21: qty 2
  Filled 2021-07-21 (×2): qty 1
  Filled 2021-07-21: qty 0.5

## 2021-07-21 MED ORDER — SODIUM CHLORIDE 0.9 % IV SOLN
500.0000 mg | Freq: Once | INTRAVENOUS | Status: AC
  Administered 2021-07-21: 500 mg via INTRAVENOUS
  Filled 2021-07-21: qty 500

## 2021-07-21 MED ORDER — MAGNESIUM SULFATE 2 GM/50ML IV SOLN
2.0000 g | Freq: Once | INTRAVENOUS | Status: AC
  Administered 2021-07-21: 2 g via INTRAVENOUS
  Filled 2021-07-21: qty 50

## 2021-07-21 NOTE — ED Triage Notes (Signed)
Pt from home by [facility] for respiratory distress. Sudden onset sob waking him from sleep. Initial oxygen sats in 80s%, bp 200/; EMS attempted cpap, pt unable tolerate. Pt clammy and diaphoretic on arrival. Resp rate 40; sats at 92% on 10LNRB

## 2021-07-21 NOTE — ED Notes (Signed)
Pt tolerating bipap at present; skin dry, pt appears comfortable

## 2021-07-21 NOTE — ED Notes (Signed)
Continuous neb treatment restarted; pt on 10L for neb and 5L on [place], sats 91%

## 2021-07-21 NOTE — ED Notes (Signed)
Pt again c/o nausea, increased work of breathing, and sob.

## 2021-07-21 NOTE — ED Notes (Signed)
Attempted to lay the head of the bed down for comfort, pt immediately became nausea, bipap mask removed,  pt began vomiting in basin, breathing labored, diaphoretic, oxygen sats dropped to 84%

## 2021-07-21 NOTE — ED Provider Notes (Signed)
[facility] EMERGENCY DEPARTMENT Provider Note   CSN: [id number] Arrival date & time: 07/21/21  0340     History Chief Complaint  Patient presents with   Respiratory Distress    Andre Ross is a 75 y.o. male.  The history is provided by the patient, medical records and the EMS personnel.  Andre Ross is a 75 y.o. male who presents to the Emergency Department complaining of sob.  Level V caveat due to respiratory distress.  Hx is provided by EMS and the patient.  He presents to the ED by EMS for evaluation of sudden onset sob that woke him from sleep.  Fire reports spo2 of 80%.  He was hypertensive to 220s.  Treated with duoneb, solumedrol 125 prior to ED arrival.  He has a hx/o COPD, denies additional medical issues.  Reports he has been well recently.  No chest pain.  Has some upper back pain for the last day. Ems attempted cpap prior to ED arrival but he did not tolerate.  Sxs are severe and constant in nature.     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) 03/28/2018   Hyperlipidemia 03/28/2018   Acute on chronic combined  systolic and diastolic CHF (congestive heart failure) ([place]) 03/28/2018   Acute gastrointestinal bleeding 03/28/2018   Diverticular hemorrhage 03/28/2018   TIA (transient ischemic attack) 03/28/2018   CKD (chronic kidney disease) stage 3, GFR 30-59 ml/min (HCC) 03/28/2018   PTSD (post-traumatic stress disorder) 03/28/2018   Alcohol abuse 03/28/2018    Past Surgical History:  Procedure Laterality Date   APPENDECTOMY     INGUINAL HERNIA REPAIR     STOMACH SURGERY     TONSILLECTOMY         Family History  Problem Relation Age of Onset   COPD Father    Heart disease Father    Heart attack Father    Hypertension Maternal Grandmother    Hypertension Paternal Grandmother     Social History   Tobacco Use   Smoking status: Former    Packs/day: 1.00    Years: 30.00    Pack years: 30.00    Types: Cigarettes    Quit date: 1988    Years since quitting: 34.9   Smokeless tobacco: Never  Vaping Use   Vaping Use: Never used  Substance Use Topics   Alcohol use: Yes    Comment: occasionally   Drug use: Not Currently    Home Medications Prior to Admission medications   Medication Sig Start Date End Date Taking? Authorizing Provider  acetaminophen (TYLENOL) 500 MG tablet Take 1 tablet by mouth 4 (four) times daily as needed for moderate pain or headache. 03/01/18  Yes [provider]  allopurinol (ZYLOPRIM) 300 MG tablet Take 150 mg by mouth daily. 03/01/18  Yes [provider]  Carboxymethylcellulose Sod PF 0.25 % SOLN Apply 15 mLs to eye 4 (four) times daily as needed (dry eyes).   Yes [provider]  Cholecalciferol (VITAMIN D) 2000 units tablet Take 2,000 Units by mouth daily. 03/01/18  Yes [provider]  citalopram (CELEXA) 40 MG tablet Take 20 mg by mouth daily. 04/04/18  Yes [provider]  clopidogrel (PLAVIX) 75 MG tablet Take 75 mg by mouth daily. 03/01/18  Yes [provider]  folic acid (FOLVITE) 1 MG tablet Take 1 mg  by mouth daily.   Yes [provider]  furosemide (LASIX) 40 MG tablet Take 1 tablet (40 mg total) by mouth daily. 04/24/18  Yes Richardo Priest, MD  gabapentin (NEURONTIN) 100 MG capsule Take 300 mg by mouth 3 (three) times daily. 04/04/18  Yes [provider]  isosorbide mononitrate (IMDUR) 60 MG 24 hr tablet Take 60 mg by mouth daily.   Yes [provider]  loratadine (CLARITIN) 10 MG tablet Take 10 mg by mouth daily as needed for allergies.   Yes [provider]  Multiple Vitamin (MULTIVITAMIN) tablet Take 1 tablet by mouth daily.   Yes [provider]  propranolol ER (INDERAL LA) 120 MG 24 hr capsule Take 120 mg by mouth daily. 03/05/18  Yes [provider]  simvastatin (ZOCOR) 20 MG tablet Take 20 mg by mouth every evening. 03/01/18  Yes [provider]  tamsulosin (FLOMAX) 0.4 MG CAPS capsule Take 0.4 mg by mouth every evening. 03/01/18  Yes [provider]  Tiotropium Bromide Monohydrate (SPIRIVA RESPIMAT) 1.25 MCG/ACT AERS Inhale 2 Pump into the lungs daily. 12/11/20 12/12/21 Yes [provider]  tobramycin-dexamethasone [facility]) ophthalmic ointment Place 1 application into the right eye at bedtime as needed (irritation).   Yes [provider]  vitamin B-12 (CYANOCOBALAMIN) 500 MCG tablet Take 1,000 mcg by mouth daily.   Yes [provider]    Allergies    Aspirin and Hydrocodone  Review of Systems   Review of Systems  All other systems reviewed and are negative.  Physical Exam Updated Vital Signs BP (!) 187/120   Pulse 85   Resp (!) 26   SpO2 91%   Physical Exam Vitals and nursing note reviewed.  Constitutional:      General: He is in acute distress.     Appearance: He is well-developed. He is ill-appearing and diaphoretic.  HENT:     Head: Normocephalic and atraumatic.  Cardiovascular:     Rate and Rhythm: Normal rate and regular rhythm.     Heart sounds: No murmur  heard. Pulmonary:     Effort: Respiratory distress present.     Comments: Decreased air movement bilaterally with crackles in the bases.   Abdominal:     Palpations: Abdomen is soft.     Tenderness: There is no abdominal tenderness. There is no guarding or rebound.  Musculoskeletal:        General: No swelling or tenderness.  Skin:    General: Skin is warm.  Neurological:     Mental Status: He is alert and oriented to person, place, and time.  Psychiatric:        Behavior: Behavior normal.    ED Results / Procedures / Treatments   Labs (all labs ordered are listed, but only abnormal results are displayed) Labs Reviewed  COMPREHENSIVE METABOLIC PANEL -  Abnormal; Notable for the following components:      Result Value   Potassium 5.9 (*)    Glucose, Bld 134 (*)    Creatinine, Ser 1.32 (*)    GFR, Estimated 56 (*)    All other components within normal limits  CBC WITH DIFFERENTIAL/PLATELET - Abnormal; Notable for the following components:   WBC 15.9 (*)    Neutro Abs 13.1 (*)    Abs Immature Granulocytes 0.10 (*)    All other components within normal limits  BRAIN NATRIURETIC PEPTIDE - Abnormal; Notable for the following components:   B Natriuretic Peptide 571.5 (*)    All other components within normal limits  I-STAT VENOUS BLOOD GAS, ED - Abnormal; Notable for the following components:   pO2, Ven 28.0 (*)    Potassium 5.9 (*)    All other components within normal limits  I-STAT CHEM 8, ED - Abnormal; Notable for the following components:   Potassium 5.9 (*)    BUN 26 (*)    Creatinine, Ser 1.30 (*)    Glucose, Bld 132 (*)    Hemoglobin 17.3 (*)    All other components within normal limits  TROPONIN I (HIGH SENSITIVITY) - Abnormal; Notable for the following components:   Troponin I (High Sensitivity) 46 (*)    All other components within normal limits  RESP PANEL BY RT-PCR (FLU A&B, COVID) ARPGX2  CULTURE, BLOOD (ROUTINE X 2)  CULTURE, BLOOD (ROUTINE X 2)  ETHANOL   LACTIC ACID, PLASMA  LACTIC ACID, PLASMA  POTASSIUM  TROPONIN I (HIGH SENSITIVITY)    EKG EKG Interpretation  Date/Time:  Wednesday July 21 2021 03:47:46 EST Ventricular Rate:  96 PR Interval:  173 QRS Duration: 105 QT Interval:  354 QTC Calculation: 448 R Axis:   -19 Text Interpretation: Sinus rhythm Probable left atrial enlargement Borderline left axis deviation Borderline low voltage, extremity leads Minimal ST depression, lateral leads Artifact Confirmed by Quintella Reichert [phone]) on 07/21/2021 6:17:39 AM  Radiology DG Chest Port 1 View  Result Date: 07/21/2021 CLINICAL DATA:  Shortness of breath. EXAM: PORTABLE CHEST 1 VIEW COMPARISON:  None. FINDINGS: Left CP angle was excluded from the exam. The heart is enlarged. There is generalized interstitial consolidation with a basilar predominance, probably due to interstitial edema with mild central vascular distention also noted. There are small pleural effusions. Mild aortic tortuosity. There is mild asymmetric elevation of the left hemidiaphragm. There are bibasilar opacities which could be atelectasis, airspace edema or pneumonia. The mid and upper lungs are clear of focal airspace infiltrates, with COPD changes. There is thoracic spondylosis. IMPRESSION: 1. Cardiomegaly with central vascular distension, with generalized interstitial edema greater in the bases. 2. Small pleural effusions, with bibasilar opacities which could be atelectasis, pneumonia or airspace edema. 3. Clinical correlation and radiographic follow-up recommended. Electronically Signed   By: Telford Nab M.D.   On: 07/21/2021 04:47    Procedures Procedures  CRITICAL CARE Performed by: Quintella Reichert   Total critical care time: 40 minutes  Critical care time was exclusive of separately billable procedures and treating other patients.  Critical care was necessary to treat or prevent imminent or life-threatening deterioration.  Critical care was time  spent personally by me on the following activities: development of treatment plan with patient and/or surrogate as well as nursing, discussions with consultants, evaluation of patient's response to treatment, examination of patient, obtaining history from patient or surrogate, ordering and performing treatments and interventions, ordering and review of laboratory studies, ordering  and review of radiographic studies, pulse oximetry and re-evaluation of patient's condition.  Medications Ordered in ED Medications  nitroGLYCERIN 50 mg in dextrose 5 % 250 mL (0.2 mg/mL) infusion (10 mcg/min Intravenous New Bag/Given 07/21/21 0401)  azithromycin (ZITHROMAX) 500 mg in sodium chloride 0.9 % 250 mL IVPB (500 mg Intravenous New Bag/Given 07/21/21 0545)  magnesium sulfate IVPB 2 g 50 mL (0 g Intravenous Stopped 07/21/21 0502)  albuterol (PROVENTIL) (2.5 MG/3ML) 0.083% nebulizer solution (10 mg/hr Nebulization Given 07/21/21 0411)  ondansetron (ZOFRAN) injection 4 mg (4 mg Intravenous Given 07/21/21 0357)  cefTRIAXone (ROCEPHIN) 1 g in sodium chloride 0.9 % 100 mL IVPB (1 g Intravenous New Bag/Given 07/21/21 0544)  ondansetron (ZOFRAN) injection 4 mg (4 mg Intravenous Given 07/21/21 9179)    ED Course  I have reviewed the triage vital signs and the nursing notes.  Pertinent labs & imaging results that were available during my care of the patient were reviewed by me and considered in my medical decision making (see chart for details).    MDM Rules/Calculators/A&P                          patient with history of COPD, alcohol abuse here for evaluation of shortness of breath that started abruptly and will confirm sleep. Patient in respiratory distress on ED arrival with increased work of breathing, tripoding, diaphoresis and fair air movement. He was placed on BiPAP for respiratory support with continuing nebulizer treatments. He was also started nitroglycerin for hypertension. Chest x-ray with concerning for  pulmonary edema versus pneumonia. He was started on antibiotics as well. Patient did develop nausea in the emergency department and BiPAP was removed and patient then did have an episode of emesis. On reassessment after episode of emesis patient did have a period of increased work of breathing, this resolved after nebulized albuterol. He was in transition to nasal cannula for additional respiratory support. Plan to admit for ongoing treatment for acute CHF with pulmonary edema and possible pneumonia.  Final Clinical Impression(s) / ED Diagnoses Final diagnoses:  Acute respiratory failure with hypoxia ([place])  Acute congestive heart failure, unspecified heart failure type [facility])    Rx / DC Orders ED Discharge Orders     None        Quintella Reichert, MD 07/21/21 [phone]

## 2021-07-21 NOTE — ED Notes (Signed)
Provider updated of pt BP 164/101 and Nitro at 67mcg/min.
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
## 2021-07-22 ENCOUNTER — Inpatient Hospital Stay (HOSPITAL_COMMUNITY): Payer: No Typology Code available for payment source

## 2021-07-22 DIAGNOSIS — I5021 Acute systolic (congestive) heart failure: Secondary | ICD-10-CM | POA: Diagnosis not present

## 2021-07-22 LAB — CBC
HCT: 40.9 % (ref 39.0–52.0)
Hemoglobin: 13.6 g/dL (ref 13.0–17.0)
MCH: 31.6 pg (ref 26.0–34.0)
MCHC: 33.3 g/dL (ref 30.0–36.0)
MCV: 94.9 fL (ref 80.0–100.0)
Platelets: 155 10*3/uL (ref 150–400)
RBC: 4.31 MIL/uL (ref 4.22–5.81)
RDW: 13.2 % (ref 11.5–15.5)
WBC: 12.6 10*3/uL — ABNORMAL HIGH (ref 4.0–10.5)
nRBC: 0 % (ref 0.0–0.2)

## 2021-07-22 LAB — BASIC METABOLIC PANEL
Anion gap: 10 (ref 5–15)
BUN: 37 mg/dL — ABNORMAL HIGH (ref 8–23)
CO2: 24 mmol/L (ref 22–32)
Calcium: 9.3 mg/dL (ref 8.9–10.3)
Chloride: 102 mmol/L (ref 98–111)
Creatinine, Ser: 2.03 mg/dL — ABNORMAL HIGH (ref 0.61–1.24)
GFR, Estimated: 33 mL/min — ABNORMAL LOW (ref >60)
Glucose, Bld: 158 mg/dL — ABNORMAL HIGH (ref 70–99)
Potassium: 4.4 mmol/L (ref 3.5–5.1)
Sodium: 136 mmol/L (ref 135–145)

## 2021-07-22 LAB — BRAIN NATRIURETIC PEPTIDE: B Natriuretic Peptide: 700.1 pg/mL — ABNORMAL HIGH (ref 0.0–100.0)

## 2021-07-22 LAB — ECHOCARDIOGRAM COMPLETE
Area-P 1/2: 3.39 cm2
Height: 69 in
S' Lateral: 3 cm
Weight: 2878.33 [oz_av]

## 2021-07-22 LAB — PROCALCITONIN: Procalcitonin: 0.13 ng/mL

## 2021-07-22 MED ORDER — FUROSEMIDE 10 MG/ML IJ SOLN
20.0000 mg | Freq: Every day | INTRAMUSCULAR | Status: DC
  Administered 2021-07-23 – 2021-07-25 (×3): 20 mg via INTRAVENOUS
  Filled 2021-07-22 (×3): qty 2

## 2021-07-22 MED ORDER — TECHNETIUM TO 99M ALBUMIN AGGREGATED
4.0000 | Freq: Once | INTRAVENOUS | Status: AC | PRN
  Administered 2021-07-22: 4 via INTRAVENOUS

## 2021-07-22 NOTE — Progress Notes (Signed)
PROGRESS NOTE    Andre Ross  OZH:[id number] DOB: September 19, 1945 DOA: 07/21/2021 PCP: Nicoletta Dress, MD   Chief Complaint  Patient presents with   Respiratory Distress  Brief Narrative/Hospital Course: Andre Ross, 75 y.o. male with PMH of HTN, combined systolic and diastolic CHF, HLD, CKD stage III, PTSD, diverticular GI bleed, alcohol abuse, hard of hearing, uses walker at baseline, lives with wife who is debilitated , not on home o2, remote history of tobacco use currently drinking 2-3 drinks of rum, vodka and occasional beer 5 of 7 days presented with shortness of breath, respiratory distress.  Symptoms started with forced cough and intermittent nausea and when he woke up he was diaphoretic and feeling generalized weakness, too weak to stand up symptoms not improved despite using his inhalers.  Also complaining of leg swelling wheezing substernal chest pressure epigastric discomfort intermittent right shoulder pain.  He had a fall about 2 to 3 weeks ago after getting tripped in relation to his neuropathy.  In route w/ EMS hypoxic in 80%-placed on CPAP unable to tolerate with tachypneic in the 40s then transition to nonrebreather given DuoNeb Solu-Medrol.  In ED afebrile, tachypneic tachycardic hypotensive hypoxic as low as 80%, transitioned to BiPAP but unable to tolerate then subsequently to nasal cannula high flow 14 L.  Labs showed leukocytosis 15.9 hyperkalemia 5.9, AKI creatinine 1.3 BNP 571 high-sensitivity troponin 46> 45, lactic acid is 2.2 chest x-ray cardiomegaly central vascular congestion generalized interstitial edema greater at the base and a small pleural effusion with atelectasis, pneumonia or airspace edema.  COVID influenza negative blood cultures and was given Lokelma magnesium IV Lasix 40 mg ceftriaxone/azithromycin Zofran bronchodilators and admitted for further ork-up-with working diagnosis of acute severe hypoxia with COPD and CHF exacerbations ,  sepsis  Subjective: Seen this am, Talking on phone in full sentence not in distress, compofrtable Afebrile overnight, oxygen requirement significantly improved to 4 L HFNC Lactic acidosis resolved to 1.9, leukocytosis downtrending 12.6, creatinine up to 2.0 D-dimer 1.6. Co chest tightness on lower chest No edema and less coughing now Co DOE with activity  Assessment & Plan:  Acute hypoxic respiratory failure multifactorial due to CHF exacerbation, COPD exacerbation: S/P IV Lasix along with bronchodilators and steroids.  Oxygen requirement significantly improved.  Continue supplemental oxygen to keep saturation above 92%, treat underlying etiology.  Repeat chest x-ray this morning shows improved right lung opacity, persistent left basilar atelectasis or edema.Lungs are clear on exam.  Severe sepsis poa due to CAP: Met severe sepsis criteria with tachycardia tachypnea leukocytosis, lactic acidosis and respiratory failure Chest x-ray concerning for atelectasis versus pneumonia versus edema.  Continue empiric antibiotic, follow-up sputum and blood culture procalcitonin. Recent Labs  Lab 07/21/21 0423 07/21/21 0530 07/21/21 0744 07/21/21 1027 07/21/21 1259 07/22/21 0255  WBC 15.9*  --   --   --   --  12.6*  LATICACIDVEN  --  2.2* 2.7* 1.9 1.9  --    Acute COPD exacerbation Remote tobacco abuse: Patient wheezing on admission, clear this am, continue bronchodilators DuoNeb, add Breo ellipta. Cont steroid and taper soon  Acute on chronic combined systolic and diastolic CHF Hypertension urgency: BNP in 500 and chest x-ray with congestion.  In the setting of hypertension urgency blood pressure 207/124 on presentation.Last echo with EF 40-40% in 9/29, updating echocardiogram  managing with IV Lasix- lungs much clear- cut lasix to 20 mg from tomorrow as creatinine trending up, received Lasix this morning already. Monitor closely.Monitor strict I/O,daly weight,  electrolytes, Cont salt and  fluid restricted diet.  Continue propanolol diuretics monitor blood pressure.  Echo reviewed EF 70 to 75%, no R WMA, G1 DD normal RV size and function, normal mitral valve aortic valve. Net IO Since Admission: -1,825 mL [07/22/21 0822]  Filed Weights   07/21/21 0849  Weight: 81.6 kg     AKI on CKD (chronic kidney disease) stage 3A: Creatinine admission 1.0 previous baseline creatinine close to 1.4, trended to 2.2 overnight.  Monitor closely Recent Labs  Lab 07/21/21 0423 07/21/21 0433 07/22/21 0255  BUN 22 26* 37*  CREATININE 1.32* 1.30* 2.03*   HLD continue simvastatin   Alcohol abuse history was for withdrawal CIWA protocols thiamine folate.  Hyperkalemia: Resolved Recent Labs  Lab 07/21/21 0423 07/21/21 0433 07/21/21 0530 07/22/21 0255  K 5.9* 5.9*  5.9* 5.4* 4.4    Elevated troponin Chest pressure: Suspect demand ischemia in the setting of severe hypoxia.  Troponin flat , no delta., follow-up echocardiogram. Continue patient's Plavix Imdur and statin - denies hx of Stent or heart attack.  Mildly elevated D-dimer: FU ECHO, unable to obtain CT angio given elevated creatinine we will see if we can obtain VQ perfusion scan done this morning and negative for PE  Hypertensive urgency Essential hypertension: Blood pressure in 200 on admission currently controlled continue diuretics and propanolol.  Nausea and abdominal pain epigastric: Lipase and LFT are normal continue PPI  PTSD continue home meds  BPH continue Flomax  Hard of hearing  Goals of care patient elected to be DNR.  Prognosis remains to be seen/guarded  DVT prophylaxis: enoxaparin (LOVENOX) injection 40 mg Start: 07/21/21 0845 Code Status:   Code Status: DNR Family Communication: plan of care discussed with patient at bedside. Status is: Inpatient Remains inpatient appropriate because: For ongoing management of respiratory failure Disposition: Currently not medically stable for discharge. Anticipated  Disposition: TBD   Objective: Vitals last 24 hrs: Vitals:   07/22/21 0408 07/22/21 0600 07/22/21 0700 07/22/21 0718  BP: (!) 148/80 (!) 150/87 (!) 148/84   Pulse: 76 78 79   Resp: 20 19 (!) 21   Temp: (!) 97.4 F (36.3 C)   97.7 F (36.5 C)  TempSrc: Oral   Oral  SpO2: 93% 95% 95%   Weight:      Height:       Weight change:   Intake/Output Summary (Last 24 hours) at 07/22/2021 6503 Last data filed at 07/22/2021 0229 Gross per 24 hour  Intake 100 ml  Output 1925 ml  Net -1825 ml   Net IO Since Admission: -1,825 mL [07/22/21 0807]   Physical Examination: General exam: AAOx 3, hard of hearing, older than stated age, weak appearing. HEENT:Oral mucosa moist, Ear/Nose WNL grossly, dentition normal. Respiratory system: bilaterally clear, no use of accessory muscle Cardiovascular system: S1 & S2 +, No JVD,. Gastrointestinal system: Abdomen soft, NT,ND, BS+ Nervous System:Alert, awake, moving extremities and grossly nonfocal Extremities: no edema, distal peripheral pulses palpable.  Skin: No rashes,no icterus. MSK: Normal muscle bulk,tone, power .  Medications reviewed:  Scheduled Meds:  allopurinol  150 mg Oral Daily   clopidogrel  75 mg Oral Daily   enoxaparin (LOVENOX) injection  40 mg Subcutaneous T46F   folic acid  1 mg Oral Daily   furosemide  40 mg Intravenous BID   gabapentin  300 mg Oral TID   ipratropium-albuterol  3 mL Nebulization QID   isosorbide mononitrate  60 mg Oral Daily   methylPREDNISolone (SOLU-MEDROL) injection  80  mg Intravenous Daily   multivitamin with minerals  1 tablet Oral Daily   pantoprazole (PROTONIX) IV  40 mg Intravenous Q12H   propranolol ER  120 mg Oral Daily   simvastatin  20 mg Oral QPM   sodium chloride flush  3 mL Intravenous Q12H   tamsulosin  0.4 mg Oral QPM   thiamine  100 mg Oral Daily   Or   thiamine  100 mg Intravenous Daily   Continuous Infusions:  cefTRIAXone (ROCEPHIN)  IV Stopped (07/21/21 1152)   cefTRIAXone  (ROCEPHIN)  IV     nitroGLYCERIN 60 mcg/min (07/21/21 2218)    Diet Order             Diet Heart Room service appropriate? Yes; Fluid consistency: Thin; Fluid restriction: 2000 mL Fluid  Diet effective now                          Weight change:   Wt Readings from Last 3 Encounters:  07/21/21 81.6 kg  04/24/18 81.6 kg     Consultants:see note  Procedures:see note Antimicrobials: Anti-infectives (From admission, onward)    Start     Dose/Rate Route Frequency Ordered Stop   07/22/21 1000  cefTRIAXone (ROCEPHIN) 2 g in sodium chloride 0.9 % 100 mL IVPB        2 g 200 mL/hr over 30 Minutes Intravenous Every 24 hours 07/21/21 1001     07/21/21 1015  cefTRIAXone (ROCEPHIN) 1 g in sodium chloride 0.9 % 100 mL IVPB        1 g 200 mL/hr over 30 Minutes Intravenous Every 24 hours 07/21/21 1001     07/21/21 0515  cefTRIAXone (ROCEPHIN) 1 g in sodium chloride 0.9 % 100 mL IVPB        1 g 200 mL/hr over 30 Minutes Intravenous  Once 07/21/21 0505 07/21/21 0651   07/21/21 0515  azithromycin (ZITHROMAX) 500 mg in sodium chloride 0.9 % 250 mL IVPB        500 mg 250 mL/hr over 60 Minutes Intravenous  Once 07/21/21 0505 07/21/21 0651      Culture/Microbiology    Component Value Date/Time   SDES EXPECTORATED SPUTUM 07/21/2021 0957   SPECREQUEST NONE 07/21/2021 0957   CULT  07/21/2021 0525    NO GROWTH 1 DAY Performed at [facility], [place] [address], [place],  [address]    REPTSTATUS PENDING 07/21/2021 0957    Other culture-see note  Unresulted Labs (From admission, onward)     Start     Ordered   07/22/21 6045  Basic metabolic panel  Daily,   R      07/21/21 0840          Data Reviewed: I have personally reviewed following labs and imaging studies CBC: Recent Labs  Lab 07/21/21 0423 07/21/21 0433 07/22/21 0255  WBC 15.9*  --  12.6*  NEUTROABS 13.1*  --   --   HGB 16.4 17.3*  17.0 13.6  HCT 50.5 51.0  50.0 40.9  MCV 97.5  --  94.9  PLT 187   --  409   Basic Metabolic Panel: Recent Labs  Lab 07/21/21 0423 07/21/21 0433 07/21/21 0530 07/21/21 1027 07/22/21 0255  NA 140 143  143  --   --  136  K 5.9* 5.9*  5.9* 5.4*  --  4.4  CL 107 108  --   --  102  CO2 22  --   --   --  24  GLUCOSE 134* 132*  --   --  158*  BUN 22 26*  --   --  37*  CREATININE 1.32* 1.30*  --   --  2.03*  CALCIUM 9.6  --   --   --  9.3  MG  --   --   --  1.8  --   PHOS  --   --   --  3.3  --    GFR: Estimated Creatinine Clearance: 31 mL/min (A) (by C-G formula based on SCr of 2.03 mg/dL (H)). Liver Function Tests: Recent Labs  Lab 07/21/21 0423  AST 39  ALT 23  ALKPHOS 79  BILITOT 1.2  PROT 7.6  ALBUMIN 4.2   Recent Labs  Lab 07/21/21 1027  LIPASE 47   No results for input(s): AMMONIA in the last 168 hours. Coagulation Profile: No results for input(s): INR, PROTIME in the last 168 hours. Cardiac Enzymes: No results for input(s): CKTOTAL, CKMB, CKMBINDEX, TROPONINI in the last 168 hours. BNP (last 3 results) No results for input(s): PROBNP in the last 8760 hours. HbA1C: No results for input(s): HGBA1C in the last 72 hours. CBG: No results for input(s): GLUCAP in the last 168 hours. Lipid Profile: No results for input(s): CHOL, HDL, LDLCALC, TRIG, CHOLHDL, LDLDIRECT in the last 72 hours. Thyroid Function Tests: Recent Labs    07/21/21 1027  TSH 2.519   Anemia Panel: No results for input(s): VITAMINB12, FOLATE, FERRITIN, TIBC, IRON, RETICCTPCT in the last 72 hours. Sepsis Labs: Recent Labs  Lab 07/21/21 0530 07/21/21 0744 07/21/21 1027 07/21/21 1259  LATICACIDVEN 2.2* 2.7* 1.9 1.9    Recent Results (from the past 240 hour(s))  Resp Panel by RT-PCR (Flu A&B, Covid) Nasopharyngeal Swab     Status: None   Collection Time: 07/21/21  4:23 AM   Specimen: Nasopharyngeal Swab; Nasopharyngeal(NP) swabs in vial transport medium  Result Value Ref Range Status   SARS Coronavirus 2 by RT PCR NEGATIVE NEGATIVE Final     Comment: (NOTE) SARS-CoV-2 target nucleic acids are NOT DETECTED.  The SARS-CoV-2 RNA is generally detectable in upper respiratory specimens during the acute phase of infection. The lowest concentration of SARS-CoV-2 viral copies this assay can detect is 138 copies/mL. A negative result does not preclude SARS-Cov-2 infection and should not be used as the sole basis for treatment or other patient management decisions. A negative result may occur with  improper specimen collection/handling, submission of specimen other than nasopharyngeal swab, presence of viral mutation(s) within the areas targeted by this assay, and inadequate number of viral copies(<138 copies/mL). A negative result must be combined with clinical observations, patient history, and epidemiological information. The expected result is Negative.  Fact Sheet for Patients:  EntrepreneurPulse.com.au  Fact Sheet for Healthcare Providers:  IncredibleEmployment.be  This test is no t yet approved or cleared by the [place] FDA and  has been authorized for detection and/or diagnosis of SARS-CoV-2 by FDA under an Emergency Use Authorization (EUA). This EUA will remain  in effect (meaning this test can be used) for the duration of the COVID-19 declaration under Section 564(b)(1) of the Act, 21 U.S.C.section 360bbb-3(b)(1), unless the authorization is terminated  or revoked sooner.       Influenza A by PCR NEGATIVE NEGATIVE Final   Influenza B by PCR NEGATIVE NEGATIVE Final    Comment: (NOTE) The Xpert Xpress SARS-CoV-2/FLU/RSV plus assay is intended as an aid in the diagnosis of influenza from Nasopharyngeal swab specimens and should not  be used as a sole basis for treatment. Nasal washings and aspirates are unacceptable for Xpert Xpress SARS-CoV-2/FLU/RSV testing.  Fact Sheet for Patients: EntrepreneurPulse.com.au  Fact Sheet for Healthcare  Providers: IncredibleEmployment.be  This test is not yet approved or cleared by the [place] FDA and has been authorized for detection and/or diagnosis of SARS-CoV-2 by FDA under an Emergency Use Authorization (EUA). This EUA will remain in effect (meaning this test can be used) for the duration of the COVID-19 declaration under Section 564(b)(1) of the Act, 21 U.S.C. section 360bbb-3(b)(1), unless the authorization is terminated or revoked.  Performed at [facility], [place] [address] [place], [place] [address]   Culture, blood (routine x 2)     Status: None (Preliminary result)   Collection Time: 07/21/21  5:06 AM   Specimen: BLOOD  Result Value Ref Range Status   Specimen Description BLOOD RIGHT ANTECUBITAL  Final   Special Requests   Final    BOTTLES DRAWN AEROBIC AND ANAEROBIC Blood Culture adequate volume   Culture   Final    NO GROWTH 1 DAY Performed at [facility], [place] [address], [place], [place] [address]    Report Status PENDING  Incomplete  Culture, blood (routine x 2)     Status: None (Preliminary result)   Collection Time: 07/21/21  5:25 AM   Specimen: BLOOD LEFT HAND  Result Value Ref Range Status   Specimen Description BLOOD LEFT HAND  Final   Special Requests   Final    BOTTLES DRAWN AEROBIC AND ANAEROBIC Blood Culture results may not be optimal due to an inadequate volume of blood received in culture bottles   Culture   Final    NO GROWTH 1 DAY Performed at [facility], [place] [address], [place], [place] [address]    Report Status PENDING  Incomplete  Expectorated Sputum Assessment w Gram Stain, Rflx to Resp Cult     Status: None (Preliminary result)   Collection Time: 07/21/21  9:57 AM   Specimen: Expectorated Sputum  Result Value Ref Range Status   Specimen Description EXPECTORATED SPUTUM  Final   Special Requests NONE  Final   Sputum evaluation   Final    Sputum specimen not acceptable for testing.  Please  recollect.   RESULT CALLED TO, READ BACK BY AND VERIFIED WITH: RN COURTNEY M. 07/22/21@2 :25 BY TW Performed at [facility], [place] [address] [place], [place] [address]    Report Status PENDING  Incomplete     Radiology Studies: DG Chest Port 1 View  Result Date: 07/21/2021 CLINICAL DATA:  Shortness of breath. EXAM: PORTABLE CHEST 1 VIEW COMPARISON:  None. FINDINGS: Left CP angle was excluded from the exam. The heart is enlarged. There is generalized interstitial consolidation with a basilar predominance, probably due to interstitial edema with mild central vascular distention also noted. There are small pleural effusions. Mild aortic tortuosity. There is mild asymmetric elevation of the left hemidiaphragm. There are bibasilar opacities which could be atelectasis, airspace edema or pneumonia. The mid and upper lungs are clear of focal airspace infiltrates, with COPD changes. There is thoracic spondylosis. IMPRESSION: 1. Cardiomegaly with central vascular distension, with generalized interstitial edema greater in the bases. 2. Small pleural effusions, with bibasilar opacities which could be atelectasis, pneumonia or airspace edema. 3. Clinical correlation and radiographic follow-up recommended. Electronically Signed   By: Telford Nab M.D.   On: 07/21/2021 04:47     LOS: 1 day  Antonieta Pert, MD [facility]  07/22/2021, 8:07 AM

## 2021-07-22 NOTE — ED Notes (Signed)
Patient transported to CT on monitor 

## 2021-07-22 NOTE — ED Notes (Signed)
Hospital bed ordered for patient.

## 2021-07-23 LAB — CBC
HCT: 35.4 % — ABNORMAL LOW (ref 39.0–52.0)
Hemoglobin: 12 g/dL — ABNORMAL LOW (ref 13.0–17.0)
MCH: 31.3 pg (ref 26.0–34.0)
MCHC: 33.9 g/dL (ref 30.0–36.0)
MCV: 92.2 fL (ref 80.0–100.0)
Platelets: 150 K/uL (ref 150–400)
RBC: 3.84 MIL/uL — ABNORMAL LOW (ref 4.22–5.81)
RDW: 13.2 % (ref 11.5–15.5)
WBC: 17 K/uL — ABNORMAL HIGH (ref 4.0–10.5)
nRBC: 0 % (ref 0.0–0.2)

## 2021-07-23 LAB — EXPECTORATED SPUTUM ASSESSMENT W GRAM STAIN, RFLX TO RESP C

## 2021-07-23 LAB — BASIC METABOLIC PANEL
Anion gap: 9 (ref 5–15)
BUN: 53 mg/dL — ABNORMAL HIGH (ref 8–23)
CO2: 25 mmol/L (ref 22–32)
Calcium: 8.7 mg/dL — ABNORMAL LOW (ref 8.9–10.3)
Chloride: 100 mmol/L (ref 98–111)
Creatinine, Ser: 1.95 mg/dL — ABNORMAL HIGH (ref 0.61–1.24)
GFR, Estimated: 35 mL/min — ABNORMAL LOW (ref >60)
Glucose, Bld: 147 mg/dL — ABNORMAL HIGH (ref 70–99)
Potassium: 4.2 mmol/L (ref 3.5–5.1)
Sodium: 134 mmol/L — ABNORMAL LOW (ref 135–145)

## 2021-07-23 LAB — PROCALCITONIN: Procalcitonin: <0.1 ng/mL

## 2021-07-23 MED ORDER — PREDNISONE 20 MG PO TABS
40.0000 mg | ORAL_TABLET | Freq: Every day | ORAL | Status: DC
  Administered 2021-07-24 – 2021-07-25 (×2): 40 mg via ORAL
  Filled 2021-07-23 (×2): qty 2

## 2021-07-23 MED ORDER — PANTOPRAZOLE SODIUM 40 MG PO TBEC
40.0000 mg | DELAYED_RELEASE_TABLET | Freq: Two times a day (BID) | ORAL | Status: DC
  Administered 2021-07-23 – 2021-07-25 (×4): 40 mg via ORAL
  Filled 2021-07-23 (×4): qty 1

## 2021-07-23 NOTE — Consult Note (Signed)
   [facility] Inpatient Consult   07/23/2021  Andre Ross Jan 16, 1946 [id number]  [facility] [ACO] Patient: Medicare CMS [place] [employer]  Primary Care Provider:  Nicoletta Dress, MD, [facility] as the provider   Patient screened for hospitalization with noted high risk score for unplanned readmission risk and  to assess for potential [facility] service needs for post hospital transition.  Came by to speak with patient patient is in patient care with therapy tech.   Plan:  Continue to follow progress and disposition to assess for post hospital care management needs.    For questions contact:   Natividad Brood, RN BSN [facility] Liaison  [phone] business mobile phone Toll free office [phone]  Fax number: [phone] [email or web address]

## 2021-07-23 NOTE — Progress Notes (Addendum)
PROGRESS NOTE    Andre Ross  QGB:[id number] DOB: August 27, 1945 DOA: 07/21/2021 PCP: Nicoletta Dress, MD   Chief Complaint  Patient presents with   Respiratory Distress  Brief Narrative/Hospital Course: Andre Ross, 75 y.o. male with PMH of HTN, combined systolic and diastolic CHF, HLD, CKD stage III, PTSD, diverticular GI bleed, alcohol abuse, hard of hearing, uses walker at baseline, lives with wife who is debilitated , not on home o2, remote history of tobacco use currently drinking 2-3 drinks of rum, vodka and occasional beer 5 of 7 days presented with shortness of breath, respiratory distress.  Symptoms started with forced cough and intermittent nausea and when he woke up he was diaphoretic and feeling generalized weakness, too weak to stand up symptoms not improved despite using his inhalers.  Also complaining of leg swelling wheezing substernal chest pressure epigastric discomfort intermittent right shoulder pain.  He had a fall about 2 to 3 weeks ago after getting tripped in relation to his neuropathy.  In route w/ EMS hypoxic in 80%-placed on CPAP unable to tolerate with tachypneic in the 40s then transition to nonrebreather given DuoNeb Solu-Medrol.  In ED afebrile, tachypneic tachycardic hypotensive hypoxic as low as 80%, transitioned to BiPAP but unable to tolerate then subsequently to nasal cannula high flow 14 L.  Labs showed leukocytosis 15.9 hyperkalemia 5.9, AKI creatinine 1.3 BNP 571 high-sensitivity troponin 46> 45, lactic acid is 2.2 chest x-ray cardiomegaly central vascular congestion generalized interstitial edema greater at the base and a small pleural effusion with atelectasis, pneumonia or airspace edema.  COVID influenza negative blood cultures and was given Lokelma magnesium IV Lasix 40 mg ceftriaxone/azithromycin Zofran bronchodilators and admitted for further ork-up-with working diagnosis of acute severe hypoxia with COPD and CHF exacerbations,, sepsis/CAP. Echo  done shows EF 70 to 75%, VQ scan was negative  Subjective: Seen and examined this morning.  Patient resting comfortably.  Reports he feels little better Afebrile overnight Creatinine slightly better 1.9 Remains on nasal cannula oxygen Blood pressure improving weaning off nitro drip  Assessment & Plan:  Acute hypoxic respiratory failure multifactorial due to CHF exacerbation, COPD exacerbation, pneumonia: Continue with IV Lasix antibiotics steroids pulm dilators, supplemental oxygen and wean as tolerated.  Cxr repeat 12/1- shows improved right lung opacity, persistent left basilar atelectasis or edema.  Severe sepsis poa due to CAP:Met severe sepsis criteria with tachycardia tachypnea leukocytosis, lactic acidosis and respiratory failure. Has leukocytosis- trending up ( likely from steroid), but lactic acidosis resolved, procalcitonin.0.13> < 0.1. Blood culture NGTD.Continue current antibiotics. Recent Labs  Lab 07/21/21 0423 07/21/21 0530 07/21/21 0744 07/21/21 1027 07/21/21 1259 07/22/21 0255 07/22/21 0828 07/23/21 0053  WBC 15.9*  --   --   --   --  12.6*  --  17.0*  LATICACIDVEN  --  2.2* 2.7* 1.9 1.9  --   --   --   PROCALCITON  --   --   --   --   --   --  0.13 <0.10    Acute COPD exacerbation Remote tobacco abuse: Patient wheezing on admission. Now lungs are clear, cont nebs,bronchodilators systemic steroid-change to po  Acute on chronic combined systolic and diastolic CHF Hypertension urgency: BNP in 500 and chest x-ray with congestion.  In the setting of hypertension urgency blood pressure 207/124 on presentation.Last echo with EF 45% in 9/29- now 65-75 %,no R WMA, G1 DD normal RV size and function, normal mitral valve aortic valve. Cont with IV Lasix , on IV  nitro for hypertension since blood pressure improved we will wean off, keep on Imdur - if BP bounce up start amlodipine,monitor closely.Monitor strict I/O,daly weight, electrolytes, Cont salt and fluid restricted  diet.Continue propanolol diuretics monitor blood pressure.  Echo reviewed EF 70 to 75%. Net IO Since Admission: -1,626 mL [07/23/21 0739]  Filed Weights   07/21/21 0849 07/23/21 0400  Weight: 81.6 kg 84.2 kg     AKI on CKD (chronic kidney disease) stage 3A: Creatinine admission 1.0 previous baseline creatinine close to 1.4, trended to 2.2-slowly downtrending monitor on diuretics, bun up 22.>53. Recent Labs  Lab 07/21/21 0423 07/21/21 0433 07/22/21 0255 07/23/21 0053  BUN 22 26* 37* 53*  CREATININE 1.32* 1.30* 2.03* 1.95*    HLD On simvastatin   Alcohol abuse monitor for withdrawal, cont CIWA protocols thiamine folate.counseling done  Hyperkalemia: Resolved Recent Labs  Lab 07/21/21 0423 07/21/21 0433 07/21/21 0530 07/22/21 0255 07/23/21 0053  K 5.9* 5.9*  5.9* 5.4* 4.4 4.2     Elevated troponin Chest pressure: Suspect demand ischemia in the setting of severe hypoxia. Troponin flat , no delta., follow-up echocardiogram w/  no RWMA. Continue patient's Plavix Imdur and statin - denies hx of Stent or MI  Mildly elevated D-dimer: Eliquis stable VQ- perfusion scan negative for PE  Nausea and abdominal pain epigastric: Lipase and LFT are normal continue PPI.resolved,  PTSD continue home meds  BPH continue Flomax  Hard of hearing  Goals of care patient elected to be DNR.  Prognosis remains to be seen/guarded  DVT prophylaxis: enoxaparin (LOVENOX) injection 40 mg Start: 07/21/21 0845 Code Status:   Code Status: DNR Family Communication: plan of care discussed with patient at bedside. Status is: Inpatient Remains inpatient appropriate because: For ongoing management of respiratory failure Disposition: Currently not medically stable for discharge. Anticipated Disposition: TBD-PT OT evaluation   Objective: Vitals last 24 hrs: Vitals:   07/22/21 2000 07/22/21 2323 07/23/21 0000 07/23/21 0400  BP: 136/81 135/76 104/72 111/66  Pulse: 72 68 65 60  Resp: (!) _0 Temp:  98.3 F (36.8 C) 98.5 F (36.9 C) 98.2 F (36.8 C)  TempSrc:  Oral Oral Oral  SpO2: 95% 95% 94% 91%  Weight:    84.2 kg  Height:       Weight change: 2.6 kg  Intake/Output Summary (Last 24 hours) at 07/23/2021 0739 Last data filed at 07/22/2021 2300 Gross per 24 hour  Intake 700 ml  Output 501 ml  Net 199 ml    Net IO Since Admission: -1,626 mL [07/23/21 0739]   Physical Examination: General exam: AAOx 3, older than stated age, weak appearing. HEENT:Oral mucosa moist, Ear/Nose WNL grossly, dentition normal. Respiratory system: bilaterally clear, no use of accessory muscle Cardiovascular system: S1 & S2 +, No JVD,. Gastrointestinal system: Abdomen soft,NT,ND, BS+ Nervous System:Alert, awake, moving extremities and grossly nonfocal Extremities: No edema, distal peripheral pulses palpable.  Skin: No rashes,no icterus. MSK: Normal muscle bulk,tone, power   Medications reviewed:  Scheduled Meds:  allopurinol  150 mg Oral Daily   clopidogrel  75 mg Oral Daily   enoxaparin (LOVENOX) injection  40 mg Subcutaneous X38H   folic acid  1 mg Oral Daily   furosemide  20 mg Intravenous Daily   gabapentin  300 mg Oral TID   ipratropium-albuterol  3 mL Nebulization QID   isosorbide mononitrate  60 mg Oral Daily   methylPREDNISolone (SOLU-MEDROL) injection  80 mg Intravenous Daily   multivitamin with minerals  1 tablet Oral Daily   pantoprazole (PROTONIX) IV  40 mg Intravenous Q12H   propranolol ER  120 mg Oral Daily   simvastatin  20 mg Oral QPM   sodium chloride flush  3 mL Intravenous Q12H   tamsulosin  0.4 mg Oral QPM   thiamine  100 mg Oral Daily   Or   thiamine  100 mg Intravenous Daily   Continuous Infusions:  cefTRIAXone (ROCEPHIN)  IV Stopped (07/22/21 1210)   nitroGLYCERIN 60 mcg/min (07/21/21 2218)    Diet Order             Diet Heart Room service appropriate? Yes; Fluid consistency: Thin; Fluid restriction: 2000 mL Fluid  Diet effective now                           Weight change: 2.6 kg  Wt Readings from Last 3 Encounters:  07/23/21 84.2 kg  04/24/18 81.6 kg     Consultants:see note  Procedures:see note Antimicrobials: Anti-infectives (From admission, onward)    Start     Dose/Rate Route Frequency Ordered Stop   07/22/21 1000  cefTRIAXone (ROCEPHIN) 2 g in sodium chloride 0.9 % 100 mL IVPB        2 g 200 mL/hr over 30 Minutes Intravenous Every 24 hours 07/21/21 1001     07/21/21 1015  cefTRIAXone (ROCEPHIN) 1 g in sodium chloride 0.9 % 100 mL IVPB  Status:  Discontinued        1 g 200 mL/hr over 30 Minutes Intravenous Every 24 hours 07/21/21 1001 07/22/21 1010   07/21/21 0515  cefTRIAXone (ROCEPHIN) 1 g in sodium chloride 0.9 % 100 mL IVPB        1 g 200 mL/hr over 30 Minutes Intravenous  Once 07/21/21 0505 07/21/21 0651   07/21/21 0515  azithromycin (ZITHROMAX) 500 mg in sodium chloride 0.9 % 250 mL IVPB        500 mg 250 mL/hr over 60 Minutes Intravenous  Once 07/21/21 0505 07/21/21 0651      Culture/Microbiology    Component Value Date/Time   SDES EXPECTORATED SPUTUM 07/21/2021 0957   SPECREQUEST NONE 07/21/2021 0957   CULT  07/21/2021 0525    NO GROWTH 2 DAYS Performed at [facility], [place] [address] [place], [place] [address]    REPTSTATUS PENDING 07/21/2021 0957    Other culture-see note  Unresulted Labs (From admission, onward)     Start     Ordered   07/23/21 0500  CBC  Daily,   R     Question:  Specimen collection method  Answer:  Lab=Lab collect   07/22/21 0827   07/23/21 0500  Procalcitonin  Daily,   R     Question:  Specimen collection method  Answer:  Lab=Lab collect   07/22/21 0827   07/22/21 4076  Basic metabolic panel  Daily,   R      07/21/21 0840          Data Reviewed: I have personally reviewed following labs and imaging studies CBC: Recent Labs  Lab 07/21/21 0423 07/21/21 0433 07/22/21 0255 07/23/21 0053  WBC 15.9*  --  12.6* 17.0*  NEUTROABS 13.1*  --   --    --   HGB 16.4 17.3*  17.0 13.6 12.0*  HCT 50.5 51.0  50.0 40.9 35.4*  MCV 97.5  --  94.9 92.2  PLT 187  --  155 150    Basic  Metabolic Panel: Recent Labs  Lab 07/21/21 0423 07/21/21 0433 07/21/21 0530 07/21/21 1027 07/22/21 0255 07/23/21 0053  NA 140 143  143  --   --  136 134*  K 5.9* 5.9*  5.9* 5.4*  --  4.4 4.2  CL 107 108  --   --  102 100  CO2 22  --   --   --  24 25  GLUCOSE 134* 132*  --   --  158* 147*  BUN 22 26*  --   --  37* 53*  CREATININE 1.32* 1.30*  --   --  2.03* 1.95*  CALCIUM 9.6  --   --   --  9.3 8.7*  MG  --   --   --  1.8  --   --   PHOS  --   --   --  3.3  --   --     GFR: Estimated Creatinine Clearance: 32.2 mL/min (A) (by C-G formula based on SCr of 1.95 mg/dL (H)). Liver Function Tests: Recent Labs  Lab 07/21/21 0423  AST 39  ALT 23  ALKPHOS 79  BILITOT 1.2  PROT 7.6  ALBUMIN 4.2    Recent Labs  Lab 07/21/21 1027  LIPASE 47    No results for input(s): AMMONIA in the last 168 hours. Coagulation Profile: No results for input(s): INR, PROTIME in the last 168 hours. Cardiac Enzymes: No results for input(s): CKTOTAL, CKMB, CKMBINDEX, TROPONINI in the last 168 hours. BNP (last 3 results) No results for input(s): PROBNP in the last 8760 hours. HbA1C: No results for input(s): HGBA1C in the last 72 hours. CBG: No results for input(s): GLUCAP in the last 168 hours. Lipid Profile: No results for input(s): CHOL, HDL, LDLCALC, TRIG, CHOLHDL, LDLDIRECT in the last 72 hours. Thyroid Function Tests: Recent Labs    07/21/21 1027  TSH 2.519    Anemia Panel: No results for input(s): VITAMINB12, FOLATE, FERRITIN, TIBC, IRON, RETICCTPCT in the last 72 hours. Sepsis Labs: Recent Labs  Lab 07/21/21 0530 07/21/21 0744 07/21/21 1027 07/21/21 1259 07/22/21 0828 07/23/21 0053  PROCALCITON  --   --   --   --  0.13 <0.10  LATICACIDVEN 2.2* 2.7* 1.9 1.9  --   --      Recent Results (from the past 240 hour(s))  Resp Panel by RT-PCR  (Flu A&B, Covid) Nasopharyngeal Swab     Status: None   Collection Time: 07/21/21  4:23 AM   Specimen: Nasopharyngeal Swab; Nasopharyngeal(NP) swabs in vial transport medium  Result Value Ref Range Status   SARS Coronavirus 2 by RT PCR NEGATIVE NEGATIVE Final    Comment: (NOTE) SARS-CoV-2 target nucleic acids are NOT DETECTED.  The SARS-CoV-2 RNA is generally detectable in upper respiratory specimens during the acute phase of infection. The lowest concentration of SARS-CoV-2 viral copies this assay can detect is 138 copies/mL. A negative result does not preclude SARS-Cov-2 infection and should not be used as the sole basis for treatment or other patient management decisions. A negative result may occur with  improper specimen collection/handling, submission of specimen other than nasopharyngeal swab, presence of viral mutation(s) within the areas targeted by this assay, and inadequate number of viral copies(<138 copies/mL). A negative result must be combined with clinical observations, patient history, and epidemiological information. The expected result is Negative.  Fact Sheet for Patients:  EntrepreneurPulse.com.au  Fact Sheet for Healthcare Providers:  IncredibleEmployment.be  This test is no t yet approved or cleared by the  [place] Architectural technologist and  has been authorized for detection and/or diagnosis of SARS-CoV-2 by FDA under an Print production planner (EUA). This EUA will remain  in effect (meaning this test can be used) for the duration of the COVID-19 declaration under Section 564(b)(1) of the Act, 21 U.S.C.section 360bbb-3(b)(1), unless the authorization is terminated  or revoked sooner.       Influenza A by PCR NEGATIVE NEGATIVE Final   Influenza B by PCR NEGATIVE NEGATIVE Final    Comment: (NOTE) The Xpert Xpress SARS-CoV-2/FLU/RSV plus assay is intended as an aid in the diagnosis of influenza from Nasopharyngeal swab specimens  and should not be used as a sole basis for treatment. Nasal washings and aspirates are unacceptable for Xpert Xpress SARS-CoV-2/FLU/RSV testing.  Fact Sheet for Patients: EntrepreneurPulse.com.au  Fact Sheet for Healthcare Providers: IncredibleEmployment.be  This test is not yet approved or cleared by the [place] FDA and has been authorized for detection and/or diagnosis of SARS-CoV-2 by FDA under an Emergency Use Authorization (EUA). This EUA will remain in effect (meaning this test can be used) for the duration of the COVID-19 declaration under Section 564(b)(1) of the Act, 21 U.S.C. section 360bbb-3(b)(1), unless the authorization is terminated or revoked.  Performed at [facility], [place] [address] [place], [place] [address]   Culture, blood (routine x 2)     Status: None (Preliminary result)   Collection Time: 07/21/21  5:06 AM   Specimen: BLOOD  Result Value Ref Range Status   Specimen Description BLOOD RIGHT ANTECUBITAL  Final   Special Requests   Final    BOTTLES DRAWN AEROBIC AND ANAEROBIC Blood Culture adequate volume   Culture   Final    NO GROWTH 2 DAYS Performed at [facility], [place] [address] [place], [place] [address]    Report Status PENDING  Incomplete  Culture, blood (routine x 2)     Status: None (Preliminary result)   Collection Time: 07/21/21  5:25 AM   Specimen: BLOOD LEFT HAND  Result Value Ref Range Status   Specimen Description BLOOD LEFT HAND  Final   Special Requests   Final    BOTTLES DRAWN AEROBIC AND ANAEROBIC Blood Culture results may not be optimal due to an inadequate volume of blood received in culture bottles   Culture   Final    NO GROWTH 2 DAYS Performed at [facility], [place] [address] [place], [place] [address]    Report Status PENDING  Incomplete  Expectorated Sputum Assessment w Gram Stain, Rflx to Resp Cult     Status: None (Preliminary result)   Collection Time:  07/21/21  9:57 AM   Specimen: Expectorated Sputum  Result Value Ref Range Status   Specimen Description EXPECTORATED SPUTUM  Final   Special Requests NONE  Final   Sputum evaluation   Final    Sputum specimen not acceptable for testing.  Please recollect.   RESULT CALLED TO, READ BACK BY AND VERIFIED WITH: RN COURTNEY M. 07/22/21_0 :25 BY TW Performed at [facility], [place] [address], [place], [place] [address]    Report Status PENDING  Incomplete      Radiology Studies: NM Pulmonary Perfusion  Result Date: 07/22/2021 CLINICAL DATA:  Shortness of breath.  Abnormal D-dimer. EXAM: NUCLEAR MEDICINE PERFUSION LUNG SCAN TECHNIQUE: Perfusion images were obtained in multiple projections after intravenous injection of radiopharmaceutical. Ventilation scans intentionally deferred if perfusion scan and chest x-ray adequate for interpretation during COVID 19 epidemic.  RADIOPHARMACEUTICALS:  4.0 mCi Tc-21m MAA IV COMPARISON:  Chest radiography same day FINDINGS: Normal perfusion examination. No defects to suggest embolic disease. IMPRESSION: Normal nuclear medicine perfusion study. No sign of embolic disease. Electronically Signed   By: Nelson Chimes M.D.   On: 07/22/2021 11:05   DG Chest Port 1 View  Result Date: 07/22/2021 CLINICAL DATA:  Shortness of breath. EXAM: PORTABLE CHEST 1 VIEW COMPARISON:  July 21, 2021. FINDINGS: Stable cardiomediastinal silhouette. No pneumothorax is noted. Significantly improved right lung opacity is noted. Continued left basilar atelectasis or edema is noted with small left pleural effusion. Bony thorax is unremarkable. IMPRESSION: Improved right lung opacity compared to prior exam. However, persistent left basilar atelectasis or edema is noted with small left pleural effusion. Electronically Signed   By: Marijo Conception M.D.   On: 07/22/2021 09:39   ECHOCARDIOGRAM COMPLETE  Result Date: 07/22/2021    ECHOCARDIOGRAM REPORT   Patient Name:   Andre Ross Date of  Exam: 07/22/2021 Medical Rec #:  [id number]      Height:       69.0 in Accession #:    [id number]     Weight:       179.9 lb Date of Birth:  November 28, 1945      BSA:          1.975 m Patient Age:    30 years       BP:           148/84 mmHg Patient Gender: M              HR:           88 bpm. Exam Location:  Inpatient Procedure: 2D Echo Indications:    CHF- Acute Systolic Q00.37  History:        Patient has prior history of Echocardiogram examinations, most                 recent 03/19/2018. Risk Factors:Dyslipidemia and Alcohol Abuse.  Sonographer:    Mikki Santee RDCS Referring Phys: [id number] RONDELL A SMITH IMPRESSIONS  1. Left ventricular ejection fraction, by estimation, is 70 to 75%. The left ventricle has hyperdynamic function. The left ventricle has no regional wall motion abnormalities. Left ventricular diastolic parameters are consistent with Grade I diastolic dysfunction (impaired relaxation).  2. Right ventricular systolic function is normal. The right ventricular size is normal.  3. The mitral valve is normal in structure. No evidence of mitral valve regurgitation. No evidence of mitral stenosis.  4. The aortic valve has an indeterminant number of cusps. Aortic valve regurgitation is not visualized. No aortic stenosis is present.  5. The inferior vena cava is normal in size with greater than 50% respiratory variability, suggesting right atrial pressure of 3 mmHg. FINDINGS  Left Ventricle: Left ventricular ejection fraction, by estimation, is 70 to 75%. The left ventricle has hyperdynamic function. The left ventricle has no regional wall motion abnormalities. The left ventricular internal cavity size was normal in size. There is no left ventricular hypertrophy. Left ventricular diastolic parameters are consistent with Grade I diastolic dysfunction (impaired relaxation). Right Ventricle: The right ventricular size is normal. Right ventricular systolic function is normal. Left Atrium: Left atrial size was  normal in size. Right Atrium: Right atrial size was normal in size. Pericardium: Trivial pericardial effusion is present. Mitral Valve: The mitral valve is normal in structure. No evidence of mitral valve regurgitation. No evidence of mitral valve stenosis. Tricuspid Valve: The tricuspid valve is  normal in structure. Tricuspid valve regurgitation is trivial. No evidence of tricuspid stenosis. Aortic Valve: The aortic valve has an indeterminant number of cusps. Aortic valve regurgitation is not visualized. No aortic stenosis is present. Pulmonic Valve: The pulmonic valve was not well visualized. Pulmonic valve regurgitation is not visualized. No evidence of pulmonic stenosis. Aorta: The aortic root is normal in size and structure. Venous: The inferior vena cava is normal in size with greater than 50% respiratory variability, suggesting right atrial pressure of 3 mmHg. IAS/Shunts: No atrial level shunt detected by color flow Doppler.  LEFT VENTRICLE PLAX 2D LVIDd:         4.70 cm   Diastology LVIDs:         3.00 cm   LV e' medial:    3.73 cm/s LV PW:         1.00 cm   LV E/e' medial:  19.6 LV IVS:        1.00 cm   LV e' lateral:   4.38 cm/s LVOT diam:     2.00 cm   LV E/e' lateral: 16.7 LV SV:         71 LV SV Index:   36 LVOT Area:     3.14 cm  RIGHT VENTRICLE RV S prime:     18.10 cm/s TAPSE (M-mode): 1.8 cm LEFT ATRIUM             Index        RIGHT ATRIUM           Index LA diam:        3.10 cm 1.57 cm/m   RA Area:     16.50 cm LA Vol (A2C):   53.5 ml 27.09 ml/m  RA Volume:   38.70 ml  19.59 ml/m LA Vol (A4C):   45.2 ml 22.89 ml/m LA Biplane Vol: 48.8 ml 24.71 ml/m  AORTIC VALVE LVOT Vmax:   119.00 cm/s LVOT Vmean:  83.000 cm/s LVOT VTI:    0.226 m  AORTA Ao Root diam: 2.70 cm Ao Asc diam:  2.90 cm MITRAL VALVE MV Area (PHT): 3.39 cm     SHUNTS MV Decel Time: 224 msec     Systemic VTI:  0.23 m MV E velocity: 73.10 cm/s   Systemic Diam: 2.00 cm MV A velocity: 108.00 cm/s MV E/A ratio:  0.68 Kirk Ruths  MD Electronically signed by Kirk Ruths MD Signature Date/Time: 07/22/2021/12:17:15 PM    Final      LOS: 2 days   Antonieta Pert, MD [facility]  07/23/2021, 7:39 AM

## 2021-07-23 NOTE — Progress Notes (Signed)
Mobility Specialist Progress Note:   07/23/21 1055  Mobility  Activity Ambulated in hall  Level of Assistance Contact guard assist, steadying assist  Assistive Device Front wheel walker  Distance Ambulated (ft) 100 ft  Mobility Ambulated with assistance in hallway  Mobility Response Tolerated well  Mobility performed by Mobility specialist  $Mobility charge 1 Mobility   Pt received in bed willing to participate in mobility. No complaints of pain. Pt requesting to use BSC, able to have BM. Pt returned to bed with call bell in reach and all needs met.   [facility] Public librarian Phone [phone] Secondary Phone [phone]

## 2021-07-23 NOTE — Evaluation (Signed)
Physical Therapy Evaluation Patient Details Name: Andre Ross MRN: [id number] DOB: 1946-02-24 Today's Date: 07/23/2021  History of Present Illness  Pt is a 75 y.o. male admitted on 07/21/21 with acute hypoxic respiratory failure due to CHF/COPD exacerbation. CXR noted cardiomegaly with vascular distention and intersitial edema. PMH includes: combined systolic/diastolic CHF, alcohol abuse, HLD, CKD stage III, TIA, left ventricular systolic dysfunction.   Clinical Impression  Pt presents with decreased functional mobility, impaired balance, and decreased strength secondary to above. PTA, pt was modified independent in ADLs and ambulated short distances with a cane. Pt has a history of falls. He lives with his wife who he provides assistance for. Currently, pt ambulating with RW requiring up to min A to correct LOB. Pt did complain of slight dizziness when sitting up and standing - BP measurements below. Recommending HH PT after discharge to maximize functional mobility and help prevent future falls. Will continue to follow acutely to address established goals.    Orthostatic BPs Supine 125/79  Sitting 144/84  Standing 128/79  Post-ambulation 131/74       Recommendations for follow up therapy are one component of a multi-disciplinary discharge planning process, led by the attending physician.  Recommendations may be updated based on patient status, additional functional criteria and insurance authorization.  Follow Up Recommendations Home health PT    Assistance Recommended at Discharge Intermittent Supervision/Assistance  Functional Status Assessment Patient has had a recent decline in their functional status and demonstrates the ability to make significant improvements in function in a reasonable and predictable amount of time.  Equipment Recommendations  Rolling walker (2 wheels)    Recommendations for Other Services OT consult     Precautions / Restrictions  Precautions Precautions: Fall Restrictions Weight Bearing Restrictions: No      Mobility  Bed Mobility Overal bed mobility: Needs Assistance Bed Mobility: Supine to Sit     Supine to sit: Supervision;HOB elevated     General bed mobility comments: supervision for safety    Transfers Overall transfer level: Needs assistance   Transfers: Sit to/from Stand Sit to Stand: Min guard           General transfer comment: able to stand with heavy reliance on UE; cues for hand placement with RW    Ambulation/Gait Ambulation/Gait assistance: Min assist;Min guard Gait Distance (Feet): 278 Feet Assistive device: Rolling walker (2 wheels) Gait Pattern/deviations: Shuffle;Decreased step length - right;Decreased step length - left Gait velocity: decreased     General Gait Details: Experience 2-3 LOB during ambulation requiring min A to correct; cues for upright posture and RW management.  Stairs            Wheelchair Mobility    Modified Rankin (Stroke Patients Only)       Balance Overall balance assessment: Needs assistance Sitting-balance support: Feet supported;Single extremity supported Sitting balance-Leahy Scale: Fair Sitting balance - Comments: pt able to sit EOB without physical assist but always used at least 1 UE   Standing balance support: Bilateral upper extremity supported;Reliant on assistive device for balance Standing balance-Leahy Scale: Poor Standing balance comment: Pt reliant on BUE support during static standing and ambulation. Pt experienced LOB when letting go of RW to put on mask, requiring Mod A to correct.                             Pertinent Vitals/Pain Pain Assessment: Faces Faces Pain Scale: No hurt    Home  Living Family/patient expects to be discharged to:: Private residence Living Arrangements: Spouse/significant other Available Help at Discharge: Available PRN/intermittently Type of Home: House Home Access: [place]: One [place]: Tub bench;Grab bars - tub/shower;Cane - single point Additional Comments: Pt lives with wife who is disabled and uses wheelchair as primary transportation. He provides most of the help for his wife. Wife's sister comes intermittently to clean but she is not available all the time because she cant drive. Pt's nephew is likely picking him up from the hospital but does not live nearby. Pt reports he feels "unclear" today, so not sure how accurate subjective information is.    Prior Function Prior Level of Function : Independent/Modified Independent             Mobility Comments: Pt ambulated with a cane in the home. Doesn't walk much out of the home. Uses buggy to lean on while grocery shopping. ADLs Comments: Modified independent in ADLs.     Hand Dominance        Extremity/Trunk Assessment   Upper Extremity Assessment Upper Extremity Assessment: Overall WFL for tasks assessed    Lower Extremity Assessment Lower Extremity Assessment: Generalized weakness;LLE deficits/detail;RLE deficits/detail RLE Sensation: history of peripheral neuropathy LLE Sensation: history of peripheral neuropathy       Communication   Communication: HOH  Cognition Arousal/Alertness: Awake/alert Behavior During Therapy: WFL for tasks assessed/performed Overall Cognitive Status: Impaired/Different from baseline Area of Impairment: Memory;Following commands;Safety/judgement;Attention;Awareness                   Current Attention Level: Selective Memory: Decreased short-term memory Following Commands: Follows one step commands consistently Safety/Judgement: Decreased awareness of safety Awareness: Emergent   General Comments: Pt alert and pleasant. Very conversational. Pt following commands but needed some redirection to stay on task. Needed cues for safe use of DME. Pt stated his memory was not as clear today.        General  Comments General comments (skin integrity, edema, etc.): Pt c/o of slight dizziness when sitting up and in standing - states he experiences this at home some too. Negative for orrthostatics. Educated pt on wearing shoes at home to protect his feet and using night lights to help prevent falls. Also talked about calling for help for his wife as needed because trying to prevent her from falling has caused him to fall before. Pt demonstrated good understanding. Discussed using a RW instead of a cane at home for more stability. Oxygen 100% on 4L during mobility, 94% on room air.    Exercises     Assessment/Plan    PT Assessment Patient needs continued PT services  PT Problem List Decreased strength;Decreased mobility;Decreased safety awareness;Decreased activity tolerance;Cardiopulmonary status limiting activity;Decreased balance;Decreased knowledge of use of DME       PT Treatment Interventions DME instruction;Therapeutic exercise;Gait training;Balance training;Therapeutic activities;Patient/family education;Functional mobility training    PT Goals (Current goals can be found in the Care Plan section)  Acute Rehab PT Goals Patient Stated Goal: to return home with his wife PT Goal Formulation: With patient Time For Goal Achievement: 08/06/21 Potential to Achieve Goals: Good    Frequency Min 3X/week   Barriers to discharge        Co-evaluation               AM-PAC PT "6 Clicks" Mobility  Outcome Measure Help needed turning from your back to your side while in a  flat bed without using bedrails?: None Help needed moving from lying on your back to sitting on the side of a flat bed without using bedrails?: A Little Help needed moving to and from a bed to a chair (including a wheelchair)?: A Little Help needed standing up from a chair using your arms (e.g., wheelchair or bedside chair)?: A Little Help needed to walk in hospital room?: A Little Help needed climbing 3-5 steps with a  railing? : A Lot 6 Click Score: 18    End of Session Equipment Utilized During Treatment: Gait belt;Oxygen Activity Tolerance: Patient tolerated treatment well Patient left: in chair;with call bell/phone within reach Nurse Communication: Mobility status (removed supplemental O2) PT Visit Diagnosis: Unsteadiness on feet (R26.81);Other abnormalities of gait and mobility (R26.89);Muscle weakness (generalized) (M62.81);History of falling (Z91.81);Difficulty in walking, not elsewhere classified (R26.2)    Time: 0165-5374 PT Time Calculation (min) (ACUTE ONLY): 39 min   Charges:   PT Evaluation $PT Eval Moderate Complexity: 1 Mod PT Treatments $Gait Training: 8-22 mins        Brandon Melnick, SPT   Brandon Melnick 07/23/2021, 5:02 PM

## 2021-07-23 NOTE — Plan of Care (Signed)

## 2021-07-24 ENCOUNTER — Telehealth: Payer: Self-pay | Admitting: Cardiology

## 2021-07-24 LAB — BASIC METABOLIC PANEL
Anion gap: 10 (ref 5–15)
BUN: 50 mg/dL — ABNORMAL HIGH (ref 8–23)
CO2: 24 mmol/L (ref 22–32)
Calcium: 9 mg/dL (ref 8.9–10.3)
Chloride: 103 mmol/L (ref 98–111)
Creatinine, Ser: 1.52 mg/dL — ABNORMAL HIGH (ref 0.61–1.24)
GFR, Estimated: 47 mL/min — ABNORMAL LOW (ref >60)
Glucose, Bld: 118 mg/dL — ABNORMAL HIGH (ref 70–99)
Potassium: 4 mmol/L (ref 3.5–5.1)
Sodium: 137 mmol/L (ref 135–145)

## 2021-07-24 LAB — CBC
HCT: 38.8 % — ABNORMAL LOW (ref 39.0–52.0)
Hemoglobin: 13.4 g/dL (ref 13.0–17.0)
MCH: 31.8 pg (ref 26.0–34.0)
MCHC: 34.5 g/dL (ref 30.0–36.0)
MCV: 92.2 fL (ref 80.0–100.0)
Platelets: 139 10*3/uL — ABNORMAL LOW (ref 150–400)
RBC: 4.21 MIL/uL — ABNORMAL LOW (ref 4.22–5.81)
RDW: 13.2 % (ref 11.5–15.5)
WBC: 16 10*3/uL — ABNORMAL HIGH (ref 4.0–10.5)
nRBC: 0 % (ref 0.0–0.2)

## 2021-07-24 LAB — PROCALCITONIN: Procalcitonin: <0.1 ng/mL

## 2021-07-24 LAB — TROPONIN I (HIGH SENSITIVITY)
Troponin I (High Sensitivity): 55 ng/L — ABNORMAL HIGH (ref <18)
Troponin I (High Sensitivity): 58 ng/L — ABNORMAL HIGH (ref <18)

## 2021-07-24 MED ORDER — AMLODIPINE BESYLATE 5 MG PO TABS
5.0000 mg | ORAL_TABLET | Freq: Every day | ORAL | Status: DC
  Administered 2021-07-24: 5 mg via ORAL
  Filled 2021-07-24: qty 1

## 2021-07-24 MED ORDER — AMLODIPINE BESYLATE 10 MG PO TABS
10.0000 mg | ORAL_TABLET | Freq: Every day | ORAL | Status: DC
  Filled 2021-07-24: qty 1

## 2021-07-24 NOTE — Consult Note (Signed)
Cardiology Consultation:   Patient ID: Scottie Metayer MRN: [id number]; DOB: November 01, 1945  Admit date: 07/21/2021 Date of Consult: 07/24/2021  PCP:  Nicoletta Dress, MD   [facility] Providers Cardiologist: Bettina Gavia '19  Patient Profile:   Jayvon Mounger is a 75 y.o. male with a hx of hypertension, TIA, CKD III, EtOH use, HFpEF, COPD?, hx of tobacco use who is being seen 07/24/2021 for the evaluation of hypertensive urgency/volume overload in the setting of pneumonia at the request of Dr. Lupita Leash.  History of Present Illness:   Mr. Roland Lipke is a 74 year old male with past medical history noted above.  He has been seen by Dr. Bettina Gavia in the past. Mostly followed through the [place] at this time.  Reports he underwent cardiac catheterization 4 to 6 years ago done through [facility] regional which was normal per his report, no stents (unable to find in Care Everywhere).   Presented to the ED on 11/30 with complaints of severe shortness of breath.  States he began to develop shortness of breath Monday the week of admission which became progressively worse over the following days.  Tuesday evening into Wednesday morning he felt significantly worse.  Woke up more dyspneic, clammy with some chest discomfort.  He walked down to his wife from his room and told her he was not feeling well.  She proceeded to call 911.  On EMS arrival he was noted to be hypoxic with sats in the 80s.  Placed on BiPAP.  Labs in the ED/on admission showed sodium 140, potassium 5.9, creatinine 1.3, BNP 571>>700, high-sensitivity troponin 46>> 45, lactic acid 2.2>> 7, WBC 15.9, hemoglobin 16.4, D-dimer 1.64.  Blood pressures noted 212/125 on arrival.  Chest x-ray with cardiomegaly, interstitial edema, small pleural effusions with bibasilar opacities consistent with atelectasis versus pneumonia versus edema.  Respiratory panel negative.  He was given [facility] as well as IV magnesium 2 g, IV nitro, started on Lasix 40 mg IV as well as  antibiotics and nebs.  Admitted to internal medicine for further management.  VQ scan negative for PE.  In talking with patient it sounds like he has been somewhat sporadic on taking his BP medications PTA.  States he was in [place] earlier part of November and ran out of his medications there.  Received a 10-day supply from the [place] there.  Since then has been taking medications, but sounds more like intermittently during interview.  Past Medical History:  Diagnosis Date   Acute gastrointestinal bleeding 03/28/2018   Acute on chronic combined systolic and diastolic CHF (congestive heart failure) ([place]) 03/28/2018   Alcohol abuse 03/28/2018   CKD (chronic kidney disease) stage 3, GFR 30-59 ml/min (HCC) 03/28/2018   Diverticular hemorrhage 03/28/2018   Hyperlipidemia 03/28/2018   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 and stage 3 chronic kidney disease ([place]) 03/28/2018   Left ventricular systolic dysfunction 0/03/1447   Segmental LAD distribution EF 45-50%   PTSD (post-traumatic stress disorder) 03/28/2018   TIA (transient ischemic attack) 03/28/2018    Past Surgical History:  Procedure Laterality Date   APPENDECTOMY     INGUINAL HERNIA REPAIR     STOMACH SURGERY     TONSILLECTOMY       Home Medications:  Prior to Admission medications   Medication Sig Start Date End Date Taking? Authorizing Provider  acetaminophen (TYLENOL) 500 MG tablet Take 1 tablet by mouth 4 (four) times daily as needed for moderate pain or headache. 03/01/18  Yes [provider]  allopurinol (ZYLOPRIM) 300 MG tablet Take 150 mg by mouth daily. 03/01/18  Yes [provider]  Carboxymethylcellulose Sod PF 0.25 % SOLN Apply 15 mLs to eye 4 (four) times daily as needed (dry eyes).   Yes [provider]  Cholecalciferol (VITAMIN D) 2000 units tablet Take 2,000 Units by mouth daily. 03/01/18  Yes [provider]  citalopram (CELEXA) 40 MG tablet Take 20 mg by  mouth daily. 04/04/18  Yes [provider]  clopidogrel (PLAVIX) 75 MG tablet Take 75 mg by mouth daily. 03/01/18  Yes [provider]  folic acid (FOLVITE) 1 MG tablet Take 1 mg by mouth daily.   Yes [provider]  furosemide (LASIX) 40 MG tablet Take 1 tablet (40 mg total) by mouth daily. 04/24/18  Yes Richardo Priest, MD  gabapentin (NEURONTIN) 100 MG capsule Take 300 mg by mouth 3 (three) times daily. 04/04/18  Yes [provider]  isosorbide mononitrate (IMDUR) 60 MG 24 hr tablet Take 60 mg by mouth daily.   Yes [provider]  loratadine (CLARITIN) 10 MG tablet Take 10 mg by mouth daily as needed for allergies.   Yes [provider]  Multiple Vitamin (MULTIVITAMIN) tablet Take 1 tablet by mouth daily.   Yes [provider]  propranolol ER (INDERAL LA) 120 MG 24 hr capsule Take 120 mg by mouth daily. 03/05/18  Yes [provider]  simvastatin (ZOCOR) 20 MG tablet Take 20 mg by mouth every evening. 03/01/18  Yes [provider]  tamsulosin (FLOMAX) 0.4 MG CAPS capsule Take 0.4 mg by mouth every evening. 03/01/18  Yes [provider]  Tiotropium Bromide Monohydrate (SPIRIVA RESPIMAT) 1.25 MCG/ACT AERS Inhale 2 Pump into the lungs daily. 12/11/20 12/12/21 Yes [provider]  tobramycin-dexamethasone [facility]) ophthalmic ointment Place 1 application into the right eye at bedtime as needed (irritation).   Yes [provider]  vitamin B-12 (CYANOCOBALAMIN) 500 MCG tablet Take 1,000 mcg by mouth daily.   Yes [provider]    Inpatient Medications: Scheduled Meds:  allopurinol  150 mg Oral Daily   amLODipine  5 mg Oral Daily   clopidogrel  75 mg Oral Daily   enoxaparin (LOVENOX) injection  40 mg Subcutaneous T73U   folic acid  1 mg Oral Daily   furosemide  20 mg Intravenous Daily   gabapentin  300 mg Oral TID   ipratropium-albuterol  3 mL Nebulization QID   isosorbide mononitrate   60 mg Oral Daily   multivitamin with minerals  1 tablet Oral Daily   pantoprazole  40 mg Oral BID   predniSONE  40 mg Oral Q breakfast   propranolol ER  120 mg Oral Daily   simvastatin  20 mg Oral QPM   sodium chloride flush  3 mL Intravenous Q12H   tamsulosin  0.4 mg Oral QPM   thiamine  100 mg Oral Daily   Or   thiamine  100 mg Intravenous Daily   Continuous Infusions:  cefTRIAXone (ROCEPHIN)  IV 2 g (07/24/21 1014)   nitroGLYCERIN 10 mcg/min (07/24/21 0954)   PRN Meds: acetaminophen **OR** acetaminophen, albuterol, hydrALAZINE, loratadine, ondansetron **OR** ondansetron (ZOFRAN) IV, polyvinyl alcohol  Allergies:    Allergies  Allergen Reactions   Aspirin Other (See Comments)    GI upset   Hydrocodone Itching    Social History:   Social History   Socioeconomic History   Marital status: Married    Spouse name: Not  on file   Number of children: Not on file   Years of education: Not on file   Highest education level: Not on file  Occupational History   Not on file  Tobacco Use   Smoking status: Former    Packs/day: 1.00    Years: 30.00    Pack years: 30.00    Types: Cigarettes    Quit date: 8    Years since quitting: 34.9   Smokeless tobacco: Never  Vaping Use   Vaping Use: Never used  Substance and Sexual Activity   Alcohol use: Yes    Comment: occasionally   Drug use: Not Currently   Sexual activity: Not on file  Other Topics Concern   Not on file  Social History Narrative   Not on file   Social Determinants of Health   Financial Resource Strain: Not on file  Food Insecurity: Not on file  Transportation Needs: Not on file  Physical Activity: Not on file  Stress: Not on file  Social Connections: Not on file  Intimate Partner Violence: Not on file    Family History:    Family History  Problem Relation Age of Onset   COPD Father    Heart disease Father    Heart attack Father    Hypertension Maternal Grandmother    Hypertension Paternal  Grandmother      ROS:  Please see the history of present illness.   All other ROS reviewed and negative.     Physical Exam/Data:   Vitals:   07/23/21 2343 07/24/21 0400 07/24/21 0712 07/24/21 0847  BP: (!) 146/84 (!) 171/161 (!) 162/95 134/86  Pulse: 66 66 60 75  Resp: 16 14 17  (!) 22  Temp:   98.1 F (36.7 C)   TempSrc:   Oral   SpO2: (!) 89% 93% 90% 92%  Weight:  86.9 kg    Height:        Intake/Output Summary (Last 24 hours) at 07/24/2021 1039 Last data filed at 07/24/2021 0718 Gross per 24 hour  Intake 1664.13 ml  Output 2450 ml  Net -785.87 ml   Last 3 Weights 07/24/2021 07/23/2021 07/21/2021  Weight (lbs) 191 lb 9.3 oz 185 lb 10 oz 179 lb 14.3 oz  Weight (kg) 86.9 kg 84.2 kg 81.6 kg     Body mass index is 28.29 kg/m.  General:  Well nourished, well developed, in no acute distress HEENT: normal Neck: no JVD Vascular: No carotid bruits; Distal pulses 2+ bilaterally Cardiac:  normal S1, S2; RRR; no murmur  Lungs:  clear to auscultation bilaterally, no wheezing, rhonchi or rales  Abd: soft, nontender, no hepatomegaly  Ext: no edema Musculoskeletal:  No deformities, BUE and BLE strength normal and equal Skin: warm and dry  Neuro:  CNs 2-12 intact, no focal abnormalities noted Psych:  Normal affect   EKG:  The EKG was personally reviewed and demonstrates:  SR, PVCs Telemetry:  Telemetry was personally reviewed and demonstrates:  SR with PVCs  Relevant CV Studies:  Echo: 07/22/21  IMPRESSIONS     1. Left ventricular ejection fraction, by estimation, is 70 to 75%. The  left ventricle has hyperdynamic function. The left ventricle has no  regional wall motion abnormalities. Left ventricular diastolic parameters  are consistent with Grade I diastolic  dysfunction (impaired relaxation).   2. Right ventricular systolic function is normal. The right ventricular  size is normal.   3. The mitral valve is normal in structure. No evidence of mitral valve  regurgitation. No evidence of mitral stenosis.   4. The aortic valve has an indeterminant number of cusps. Aortic valve  regurgitation is not visualized. No aortic stenosis is present.   5. The inferior vena cava is normal in size with greater than 50%  respiratory variability, suggesting right atrial pressure of 3 mmHg.   FINDINGS   Left Ventricle: Left ventricular ejection fraction, by estimation, is 70  to 75%. The left ventricle has hyperdynamic function. The left ventricle  has no regional wall motion abnormalities. The left ventricular internal  cavity size was normal in size.  There is no left ventricular hypertrophy. Left ventricular diastolic  parameters are consistent with Grade I diastolic dysfunction (impaired  relaxation).   Right Ventricle: The right ventricular size is normal. Right ventricular  systolic function is normal.   Left Atrium: Left atrial size was normal in size.   Right Atrium: Right atrial size was normal in size.   Pericardium: Trivial pericardial effusion is present.   Mitral Valve: The mitral valve is normal in structure. No evidence of  mitral valve regurgitation. No evidence of mitral valve stenosis.   Tricuspid Valve: The tricuspid valve is normal in structure. Tricuspid  valve regurgitation is trivial. No evidence of tricuspid stenosis.   Aortic Valve: The aortic valve has an indeterminant number of cusps.  Aortic valve regurgitation is not visualized. No aortic stenosis is  present.   Pulmonic Valve: The pulmonic valve was not well visualized. Pulmonic valve  regurgitation is not visualized. No evidence of pulmonic stenosis.   Aorta: The aortic root is normal in size and structure.   Venous: The inferior vena cava is normal in size with greater than 50%  respiratory variability, suggesting right atrial pressure of 3 mmHg.   IAS/Shunts: No atrial level shunt detected by color flow Doppler.       Laboratory Data:  High Sensitivity  Troponin:   Recent Labs  Lab 07/21/21 0423 07/21/21 0530  TROPONINIHS 46* 45*     Chemistry Recent Labs  Lab 07/21/21 1027 07/22/21 0255 07/23/21 0053 07/24/21 0314  NA  --  136 134* 137  K  --  4.4 4.2 4.0  CL  --  102 100 103  CO2  --  24 25 24   GLUCOSE  --  158* 147* 118*  BUN  --  37* 53* 50*  CREATININE  --  2.03* 1.95* 1.52*  CALCIUM  --  9.3 8.7* 9.0  MG 1.8  --   --   --   GFRNONAA  --  33* 35* 47*  ANIONGAP  --  10 9 10     Recent Labs  Lab 07/21/21 0423  PROT 7.6  ALBUMIN 4.2  AST 39  ALT 23  ALKPHOS 79  BILITOT 1.2   Lipids No results for input(s): CHOL, TRIG, HDL, LABVLDL, LDLCALC, CHOLHDL in the last 168 hours.  Hematology Recent Labs  Lab 07/22/21 0255 07/23/21 0053 07/24/21 0314  WBC 12.6* 17.0* 16.0*  RBC 4.31 3.84* 4.21*  HGB 13.6 12.0* 13.4  HCT 40.9 35.4* 38.8*  MCV 94.9 92.2 92.2  MCH 31.6 31.3 31.8  MCHC 33.3 33.9 34.5  RDW 13.2 13.2 13.2  PLT 155 150 139*   Thyroid  Recent Labs  Lab 07/21/21 1027  TSH 2.519    BNP Recent Labs  Lab 07/21/21 0423 07/22/21 0828  BNP 571.5* 700.1*    DDimer  Recent Labs  Lab 07/21/21 1027  DDIMER 1.64*     Radiology/Studies:  NM Pulmonary Perfusion  Result Date: 07/22/2021 CLINICAL DATA:  Shortness of breath.  Abnormal D-dimer. EXAM: NUCLEAR MEDICINE PERFUSION LUNG SCAN TECHNIQUE: Perfusion images were obtained in multiple projections after intravenous injection of radiopharmaceutical. Ventilation scans intentionally deferred if perfusion scan and chest x-ray adequate for interpretation during COVID 19 epidemic. RADIOPHARMACEUTICALS:  4.0 mCi Tc-75m MAA IV COMPARISON:  Chest radiography same day FINDINGS: Normal perfusion examination. No defects to suggest embolic disease. IMPRESSION: Normal nuclear medicine perfusion study. No sign of embolic disease. Electronically Signed   By: Nelson Chimes M.D.   On: 07/22/2021 11:05   DG Chest Port 1 View  Result Date: 07/22/2021 CLINICAL DATA:   Shortness of breath. EXAM: PORTABLE CHEST 1 VIEW COMPARISON:  July 21, 2021. FINDINGS: Stable cardiomediastinal silhouette. No pneumothorax is noted. Significantly improved right lung opacity is noted. Continued left basilar atelectasis or edema is noted with small left pleural effusion. Bony thorax is unremarkable. IMPRESSION: Improved right lung opacity compared to prior exam. However, persistent left basilar atelectasis or edema is noted with small left pleural effusion. Electronically Signed   By: Marijo Conception M.D.   On: 07/22/2021 09:39   DG Chest Port 1 View  Result Date: 07/21/2021 CLINICAL DATA:  Shortness of breath. EXAM: PORTABLE CHEST 1 VIEW COMPARISON:  None. FINDINGS: Left CP angle was excluded from the exam. The heart is enlarged. There is generalized interstitial consolidation with a basilar predominance, probably due to interstitial edema with mild central vascular distention also noted. There are small pleural effusions. Mild aortic tortuosity. There is mild asymmetric elevation of the left hemidiaphragm. There are bibasilar opacities which could be atelectasis, airspace edema or pneumonia. The mid and upper lungs are clear of focal airspace infiltrates, with COPD changes. There is thoracic spondylosis. IMPRESSION: 1. Cardiomegaly with central vascular distension, with generalized interstitial edema greater in the bases. 2. Small pleural effusions, with bibasilar opacities which could be atelectasis, pneumonia or airspace edema. 3. Clinical correlation and radiographic follow-up recommended. Electronically Signed   By: Telford Nab M.D.   On: 07/21/2021 04:47   ECHOCARDIOGRAM COMPLETE  Result Date: 07/22/2021    ECHOCARDIOGRAM REPORT   Patient Name:   Aiyden GASPAR FOWLE Date of Exam: 07/22/2021 Medical Rec #:  [id number]      Height:       69.0 in Accession #:    [id number]     Weight:       179.9 lb Date of Birth:  [birth year]      BSA:          1.975 m Patient Age:    27 years       BP:            148/84 mmHg Patient Gender: M              HR:           88 bpm. Exam Location:  Inpatient Procedure: 2D Echo Indications:    CHF- Acute Systolic X79.39  History:        Patient has prior history of Echocardiogram examinations, most                 recent 03/19/2018. Risk Factors:Dyslipidemia and Alcohol Abuse.  Sonographer:    Mikki Santee RDCS Referring Phys: [id number] RONDELL A SMITH IMPRESSIONS  1. Left ventricular ejection fraction, by estimation, is 70 to 75%. The left ventricle has hyperdynamic function. The left ventricle has no regional wall motion abnormalities. Left ventricular diastolic parameters are consistent  with Grade I diastolic dysfunction (impaired relaxation).  2. Right ventricular systolic function is normal. The right ventricular size is normal.  3. The mitral valve is normal in structure. No evidence of mitral valve regurgitation. No evidence of mitral stenosis.  4. The aortic valve has an indeterminant number of cusps. Aortic valve regurgitation is not visualized. No aortic stenosis is present.  5. The inferior vena cava is normal in size with greater than 50% respiratory variability, suggesting right atrial pressure of 3 mmHg. FINDINGS  Left Ventricle: Left ventricular ejection fraction, by estimation, is 70 to 75%. The left ventricle has hyperdynamic function. The left ventricle has no regional wall motion abnormalities. The left ventricular internal cavity size was normal in size. There is no left ventricular hypertrophy. Left ventricular diastolic parameters are consistent with Grade I diastolic dysfunction (impaired relaxation). Right Ventricle: The right ventricular size is normal. Right ventricular systolic function is normal. Left Atrium: Left atrial size was normal in size. Right Atrium: Right atrial size was normal in size. Pericardium: Trivial pericardial effusion is present. Mitral Valve: The mitral valve is normal in structure. No evidence of mitral valve  regurgitation. No evidence of mitral valve stenosis. Tricuspid Valve: The tricuspid valve is normal in structure. Tricuspid valve regurgitation is trivial. No evidence of tricuspid stenosis. Aortic Valve: The aortic valve has an indeterminant number of cusps. Aortic valve regurgitation is not visualized. No aortic stenosis is present. Pulmonic Valve: The pulmonic valve was not well visualized. Pulmonic valve regurgitation is not visualized. No evidence of pulmonic stenosis. Aorta: The aortic root is normal in size and structure. Venous: The inferior vena cava is normal in size with greater than 50% respiratory variability, suggesting right atrial pressure of 3 mmHg. IAS/Shunts: No atrial level shunt detected by color flow Doppler.  LEFT VENTRICLE PLAX 2D LVIDd:         4.70 cm   Diastology LVIDs:         3.00 cm   LV e' medial:    3.73 cm/s LV PW:         1.00 cm   LV E/e' medial:  19.6 LV IVS:        1.00 cm   LV e' lateral:   4.38 cm/s LVOT diam:     2.00 cm   LV E/e' lateral: 16.7 LV SV:         71 LV SV Index:   36 LVOT Area:     3.14 cm  RIGHT VENTRICLE RV S prime:     18.10 cm/s TAPSE (M-mode): 1.8 cm LEFT ATRIUM             Index        RIGHT ATRIUM           Index LA diam:        3.10 cm 1.57 cm/m   RA Area:     16.50 cm LA Vol (A2C):   53.5 ml 27.09 ml/m  RA Volume:   38.70 ml  19.59 ml/m LA Vol (A4C):   45.2 ml 22.89 ml/m LA Biplane Vol: 48.8 ml 24.71 ml/m  AORTIC VALVE LVOT Vmax:   119.00 cm/s LVOT Vmean:  83.000 cm/s LVOT VTI:    0.226 m  AORTA Ao Root diam: 2.70 cm Ao Asc diam:  2.90 cm MITRAL VALVE MV Area (PHT): 3.39 cm     SHUNTS MV Decel Time: 224 msec     Systemic VTI:  0.23 m MV E velocity: 73.10 cm/s  Systemic Diam: 2.00 cm MV A velocity: 108.00 cm/s MV E/A ratio:  0.68 Kirk Ruths MD Electronically signed by Kirk Ruths MD Signature Date/Time: 07/22/2021/12:17:15 PM    Final      Assessment and Plan:   Ghazi Rumpf is a 75 y.o. male with a hx of hypertension, TIA, CKD III,  EtOH use, HFpEF, COPD?, hx of tobacco use who is being seen 07/24/2021 for the evaluation of hypertensive urgency/volume overload in the setting of pneumonia at the request of Dr. Lupita Leash.  Acute Hypoxic Respiratory Failure Secondary to CAP: Sepsis criteria on admission with elevated lactic acid and WBC.  Treated with IV antibiotics.  Blood cultures negative to date.  Has been weaned to room air. --Per primary  HFpEF: Echo this admission showed LVEF of 70 to 75% with hyperdynamic function, no regional wall motion abnormality, grade 1 diastolic dysfunction, normal RV.  BNP elevated on admission as well as small bilateral effusions on chest x-ray.  He has been diuresed with IV Lasix, net -2.3 L, feeling much better.  -- likely transition to PO lasix for tomorrow  Hypertensive urgency: PTA meds include propanolol and Imdur, though sounds he has been taking these intermittent. --on propranolol  --Continue amlodipine can further titration to 10mg , as well as Imdur --IV nitroglycerin is ordered, but patient's IV pulled out.  Would DC  Mildly elevated troponin/chest discomfort: High-sensitivity troponin on admission 46>>45.  EKG without acute ischemia.  This is in the setting of pneumonia, therefore suspect demand ischemia.  Echo with hyperdynamic function, no regional wall motion abnormality.  CKD stage III: Cr 1.32>>2.03>>1.95>>1.52 -- no room for ACE/ARB at this time  HLD: on statin  ETOH use: CIWA per primary  Risk Assessment/Risk Scores:     New York Heart Association (NYHA) Functional Class NYHA Class II   For questions or updates, please contact [facility] Please consult [email or web address] for contact info under    Signed, Reino Bellis, NP  07/24/2021 10:39 AM

## 2021-07-24 NOTE — Progress Notes (Signed)
PROGRESS NOTE    Andre Ross  WUJ:[id number] DOB: 08/04/1946 DOA: 07/21/2021 PCP: Nicoletta Dress, MD   Chief Complaint  Patient presents with   Respiratory Distress  Brief Narrative/Hospital Course: Andre Ross, 75 y.o. male with PMH of HTN, combined systolic and diastolic CHF, HLD, CKD stage III, PTSD, diverticular GI bleed, alcohol abuse, hard of hearing, uses walker at baseline, lives with wife who is debilitated , not on home o2, remote history of tobacco use currently drinking 2-3 drinks of rum, vodka and occasional beer 5 of 7 days presented with shortness of breath, respiratory distress.  Symptoms started with forced cough and intermittent nausea and when he woke up he was diaphoretic and feeling generalized weakness, too weak to stand up symptoms not improved despite using his inhalers.  Also complaining of leg swelling wheezing substernal chest pressure epigastric discomfort intermittent right shoulder pain.  He had a fall about 2 to 3 weeks ago after getting tripped in relation to his neuropathy.  In route w/ EMS hypoxic in 80%-placed on CPAP unable to tolerate with tachypneic in the 40s then transition to nonrebreather given DuoNeb Solu-Medrol.  In ED afebrile, tachypneic tachycardic hypotensive hypoxic as low as 80%, transitioned to BiPAP but unable to tolerate then subsequently to nasal cannula high flow 14 L.  Labs showed leukocytosis 15.9 hyperkalemia 5.9, AKI creatinine 1.3 BNP 571 high-sensitivity troponin 46> 45, lactic acid is 2.2 chest x-ray cardiomegaly central vascular congestion generalized interstitial edema greater at the base and a small pleural effusion with atelectasis, pneumonia or airspace edema.  COVID influenza negative blood cultures and was given Lokelma magnesium IV Lasix 40 mg ceftriaxone/azithromycin Zofran bronchodilators and admitted for further ork-up-with working diagnosis of acute severe hypoxia with COPD and CHF exacerbations,, sepsis/CAP. Echo  done shows EF 70 to 75%, VQ scan was negative  Subjective: Co chest pain x2 during night No fever overnight, off Stella this am and spo2 at 93-97% Renal function improving, WBC count 17>16k On nitro drip at 5- adding amlodipine today  Assessment & Plan:  Acute hypoxic respiratory failure multifactorial due to CHF exacerbation, COPD exacerbation, pneumonia: Continue with IV Lasix, antibiotics, oral steroids bronchodilators, weaned off oxygen and now on RA.CXR repeat 12/1- shows improved right lung opacity, persistent left basilar atelectasis or edema.  Severe sepsis poa due to CAP:Met severe sepsis criteria with tachycardia tachypnea leukocytosis, lactic acidosis and respiratory failure.  Afebrile WBC count improving , lactic acidosis resolved, procalcitonin.0.13 to  < 0.1. Blood culture NGTD.Continue current antibiotics-ceftriaxone and azithromycin. Recent Labs  Lab 07/21/21 0423 07/21/21 0530 07/21/21 0744 07/21/21 1027 07/21/21 1259 07/22/21 0255 07/22/21 0828 07/23/21 0053 07/24/21 0314  WBC 15.9*  --   --   --   --  12.6*  --  17.0* 16.0*  LATICACIDVEN  --  2.2* 2.7* 1.9 1.9  --   --   --   --   PROCALCITON  --   --   --   --   --   --  0.13 <0.10 <0.10   Acute COPD exacerbation/Remote tobacco abuse: Patient wheezing on admission.  Now improved, cont po steroid and wean off continue bronchodilators-duonebs  Acute on chronic combined systolic and diastolic CHF Hypertension urgency: BNP in 500 -700  and BP in 207/124 on presentation.Last echo with EF 45% in 9/29- now  lvef70-75 %,no R WMA, G1 DD normal RV size and function, normal mitral valve aortic valve.  On IV nitroglycerin drip-start amlodipine, continue Imdur, propanolol hopefully can  wean off nitroglycerin. Cont lasix 20 mg  daily.Monitor strict I/O,daly weight, wt appears up, monitor electrolytes. Con salt and fluid restricted diet.Net IO Since Admission: -2,171.87 mL [07/24/21 0840]  Wt 81.6> 84.2>86.9 kg  Elevated  troponin Chest pressure: Suspect demand ischemia in the setting of hypoxia on admission.Troponin flat , no delta,echocardiogram w/  no RWMA. Continue patient's Plavix Imdur and statin - denies hx of Stent or MI. Chest pain again overnight- check ekg, troponins , cardio consulted.  AKI on CKD (chronic kidney disease) stage 3A: Creatinine admission 1.0 previous baseline creatinine close to 1.4, peaked to 2.0 now improving bun up 22.>53>50. Recent Labs  Lab 07/21/21 0423 07/21/21 0433 07/22/21 0255 07/23/21 0053 07/24/21 0314  BUN 22 26* 37* 53* 50*  CREATININE 1.32* 1.30* 2.03* 1.95* 1.52*   HLD cont simvastatin   Alcohol abuse monitor for withdrawal, cont CIWA protocols thiamine folate.counseling done  Hyperkalemia: Resolved Recent Labs  Lab 07/21/21 0433 07/21/21 0530 07/22/21 0255 07/23/21 0053 07/24/21 0314  K 5.9*  5.9* 5.4* 4.4 4.2 4.0    Mildly elevated D-dimer:VQ- perfusion scan negative for PE  Nausea and abdominal pain epigastric: Lipase and LFT are normal continue PPI. Resolved,  PTSD continue home meds BPH continue Flomax Goals of care patient elected to be DNR.  Continue supportive care  DVT prophylaxis: enoxaparin (LOVENOX) injection 40 mg Start: 07/21/21 0845 Code Status:   Code Status: DNR Family Communication: plan of care discussed with patient at bedside. Status is: Inpatient Remains inpatient appropriate because: For ongoing management of respiratory failure Disposition: Currently not medically stable for discharge. Anticipated Disposition: HH   Objective: Vitals last 24 hrs: Vitals:   07/23/21 2342 07/23/21 2343 07/24/21 0400 07/24/21 0712  BP: (!) 138/94 (!) 146/84 (!) 171/161 (!) 162/95  Pulse: 64 66 66 60  Resp: 17 16 14 17   Temp: 98 F (36.7 C)   98.1 F (36.7 C)  TempSrc: Oral   Oral  SpO2: 92% (!) 89% 93% 90%  Weight:   86.9 kg   Height:       Weight change: 2.7 kg  Intake/Output Summary (Last 24 hours) at 07/24/2021 0840 Last  data filed at 07/24/2021 9622 Gross per 24 hour  Intake 1664.13 ml  Output 2450 ml  Net -785.87 ml   Net IO Since Admission: -2,171.87 mL [07/24/21 0840]   Physical Examination: General exam: AAOx 3 older than stated age, weak appearing. HEENT:Oral mucosa moist, Ear/Nose WNL grossly, dentition normal. Respiratory system: bilaterally dclear, no use of accessory muscle Cardiovascular system: S1 & S2 +, No JVD,. Gastrointestinal system: Abdomen soft, NT,ND, BS+ Nervous System:Alert, awake, moving extremities and grossly nonfocal Extremities: No edema, distal peripheral pulses palpable.  Skin: No rashes,no icterus. MSK: Normal muscle bulk,tone, power   Medications reviewed:  Scheduled Meds:  allopurinol  150 mg Oral Daily   amLODipine  5 mg Oral Daily   clopidogrel  75 mg Oral Daily   enoxaparin (LOVENOX) injection  40 mg Subcutaneous W97L   folic acid  1 mg Oral Daily   furosemide  20 mg Intravenous Daily   gabapentin  300 mg Oral TID   ipratropium-albuterol  3 mL Nebulization QID   isosorbide mononitrate  60 mg Oral Daily   multivitamin with minerals  1 tablet Oral Daily   pantoprazole  40 mg Oral BID   predniSONE  40 mg Oral Q breakfast   propranolol ER  120 mg Oral Daily   simvastatin  20 mg Oral QPM  sodium chloride flush  3 mL Intravenous Q12H   tamsulosin  0.4 mg Oral QPM   thiamine  100 mg Oral Daily   Or   thiamine  100 mg Intravenous Daily   Continuous Infusions:  cefTRIAXone (ROCEPHIN)  IV Stopped (07/23/21 1023)   nitroGLYCERIN 16.667 mcg/min (07/24/21 0502)    Diet Order             Diet Heart Room service appropriate? Yes; Fluid consistency: Thin; Fluid restriction: 2000 mL Fluid  Diet effective now                          Weight change: 2.7 kg  Wt Readings from Last 3 Encounters:  07/24/21 86.9 kg  04/24/18 81.6 kg     Consultants:see note  Procedures:see note Antimicrobials: Anti-infectives (From admission, onward)    Start      Dose/Rate Route Frequency Ordered Stop   07/22/21 1000  cefTRIAXone (ROCEPHIN) 2 g in sodium chloride 0.9 % 100 mL IVPB        2 g 200 mL/hr over 30 Minutes Intravenous Every 24 hours 07/21/21 1001     07/21/21 1015  cefTRIAXone (ROCEPHIN) 1 g in sodium chloride 0.9 % 100 mL IVPB  Status:  Discontinued        1 g 200 mL/hr over 30 Minutes Intravenous Every 24 hours 07/21/21 1001 07/22/21 1010   07/21/21 0515  cefTRIAXone (ROCEPHIN) 1 g in sodium chloride 0.9 % 100 mL IVPB        1 g 200 mL/hr over 30 Minutes Intravenous  Once 07/21/21 0505 07/21/21 0651   07/21/21 0515  azithromycin (ZITHROMAX) 500 mg in sodium chloride 0.9 % 250 mL IVPB        500 mg 250 mL/hr over 60 Minutes Intravenous  Once 07/21/21 0505 07/21/21 0651      Culture/Microbiology    Component Value Date/Time   SDES EXPECTORATED SPUTUM 07/21/2021 0957   SPECREQUEST NONE 07/21/2021 0957   CULT  07/21/2021 0525    NO GROWTH 3 DAYS Performed at [facility], [place] [address], [place], [place] [address]    REPTSTATUS 07/23/2021 FINAL 07/21/2021 0957    Other culture-see note  Unresulted Labs (From admission, onward)     Start     Ordered   07/23/21 0500  CBC  Daily,   R     Question:  Specimen collection method  Answer:  Lab=Lab collect   07/22/21 0827   07/22/21 7510  Basic metabolic panel  Daily,   R      07/21/21 0840          Data Reviewed: I have personally reviewed following labs and imaging studies CBC: Recent Labs  Lab 07/21/21 0423 07/21/21 0433 07/22/21 0255 07/23/21 0053 07/24/21 0314  WBC 15.9*  --  12.6* 17.0* 16.0*  NEUTROABS 13.1*  --   --   --   --   HGB 16.4 17.3*  17.0 13.6 12.0* 13.4  HCT 50.5 51.0  50.0 40.9 35.4* 38.8*  MCV 97.5  --  94.9 92.2 92.2  PLT 187  --  155 150 258*   Basic Metabolic Panel: Recent Labs  Lab 07/21/21 0423 07/21/21 0433 07/21/21 0530 07/21/21 1027 07/22/21 0255 07/23/21 0053 07/24/21 0314  NA 140 143  143  --   --  136 134* 137  K  5.9* 5.9*  5.9* 5.4*  --  4.4 4.2 4.0  CL 107 108  --   --  102 100 103  CO2 22  --   --   --  24 25 24   GLUCOSE 134* 132*  --   --  158* 147* 118*  BUN 22 26*  --   --  37* 53* 50*  CREATININE 1.32* 1.30*  --   --  2.03* 1.95* 1.52*  CALCIUM 9.6  --   --   --  9.3 8.7* 9.0  MG  --   --   --  1.8  --   --   --   PHOS  --   --   --  3.3  --   --   --    GFR: Estimated Creatinine Clearance: 45.1 mL/min (A) (by C-G formula based on SCr of 1.52 mg/dL (H)). Liver Function Tests: Recent Labs  Lab 07/21/21 0423  AST 39  ALT 23  ALKPHOS 79  BILITOT 1.2  PROT 7.6  ALBUMIN 4.2   Recent Labs  Lab 07/21/21 1027  LIPASE 47   No results for input(s): AMMONIA in the last 168 hours. Coagulation Profile: No results for input(s): INR, PROTIME in the last 168 hours. Cardiac Enzymes: No results for input(s): CKTOTAL, CKMB, CKMBINDEX, TROPONINI in the last 168 hours. BNP (last 3 results) No results for input(s): PROBNP in the last 8760 hours. HbA1C: No results for input(s): HGBA1C in the last 72 hours. CBG: No results for input(s): GLUCAP in the last 168 hours. Lipid Profile: No results for input(s): CHOL, HDL, LDLCALC, TRIG, CHOLHDL, LDLDIRECT in the last 72 hours. Thyroid Function Tests: Recent Labs    07/21/21 1027  TSH 2.519   Anemia Panel: No results for input(s): VITAMINB12, FOLATE, FERRITIN, TIBC, IRON, RETICCTPCT in the last 72 hours. Sepsis Labs: Recent Labs  Lab 07/21/21 0530 07/21/21 0744 07/21/21 1027 07/21/21 1259 07/22/21 0828 07/23/21 0053 07/24/21 0314  PROCALCITON  --   --   --   --  0.13 <0.10 <0.10  LATICACIDVEN 2.2* 2.7* 1.9 1.9  --   --   --     Recent Results (from the past 240 hour(s))  Resp Panel by RT-PCR (Flu A&B, Covid) Nasopharyngeal Swab     Status: None   Collection Time: 07/21/21  4:23 AM   Specimen: Nasopharyngeal Swab; Nasopharyngeal(NP) swabs in vial transport medium  Result Value Ref Range Status   SARS Coronavirus 2 by RT PCR  NEGATIVE NEGATIVE Final    Comment: (NOTE) SARS-CoV-2 target nucleic acids are NOT DETECTED.  The SARS-CoV-2 RNA is generally detectable in upper respiratory specimens during the acute phase of infection. The lowest concentration of SARS-CoV-2 viral copies this assay can detect is 138 copies/mL. A negative result does not preclude SARS-Cov-2 infection and should not be used as the sole basis for treatment or other patient management decisions. A negative result may occur with  improper specimen collection/handling, submission of specimen other than nasopharyngeal swab, presence of viral mutation(s) within the areas targeted by this assay, and inadequate number of viral copies(<138 copies/mL). A negative result must be combined with clinical observations, patient history, and epidemiological information. The expected result is Negative.  Fact Sheet for Patients:  EntrepreneurPulse.com.au  Fact Sheet for Healthcare Providers:  IncredibleEmployment.be  This test is no t yet approved or cleared by the [place] FDA and  has been authorized for detection and/or diagnosis of SARS-CoV-2 by FDA under an Emergency Use Authorization (EUA). This EUA will remain  in effect (meaning this test can be used) for the duration of the COVID-19  declaration under Section 564(b)(1) of the Act, 21 U.S.C.section 360bbb-3(b)(1), unless the authorization is terminated  or revoked sooner.       Influenza A by PCR NEGATIVE NEGATIVE Final   Influenza B by PCR NEGATIVE NEGATIVE Final    Comment: (NOTE) The Xpert Xpress SARS-CoV-2/FLU/RSV plus assay is intended as an aid in the diagnosis of influenza from Nasopharyngeal swab specimens and should not be used as a sole basis for treatment. Nasal washings and aspirates are unacceptable for Xpert Xpress SARS-CoV-2/FLU/RSV testing.  Fact Sheet for Patients: EntrepreneurPulse.com.au  Fact Sheet for  Healthcare Providers: IncredibleEmployment.be  This test is not yet approved or cleared by the [place] FDA and has been authorized for detection and/or diagnosis of SARS-CoV-2 by FDA under an Emergency Use Authorization (EUA). This EUA will remain in effect (meaning this test can be used) for the duration of the COVID-19 declaration under Section 564(b)(1) of the Act, 21 U.S.C. section 360bbb-3(b)(1), unless the authorization is terminated or revoked.  Performed at [facility], [place] [address], [place], [place] [address]   Culture, blood (routine x 2)     Status: None (Preliminary result)   Collection Time: 07/21/21  5:06 AM   Specimen: BLOOD  Result Value Ref Range Status   Specimen Description BLOOD RIGHT ANTECUBITAL  Final   Special Requests   Final    BOTTLES DRAWN AEROBIC AND ANAEROBIC Blood Culture adequate volume   Culture   Final    NO GROWTH 3 DAYS Performed at [facility], [place] [address] [place], [place] [address]    Report Status PENDING  Incomplete  Culture, blood (routine x 2)     Status: None (Preliminary result)   Collection Time: 07/21/21  5:25 AM   Specimen: BLOOD LEFT HAND  Result Value Ref Range Status   Specimen Description BLOOD LEFT HAND  Final   Special Requests   Final    BOTTLES DRAWN AEROBIC AND ANAEROBIC Blood Culture results may not be optimal due to an inadequate volume of blood received in culture bottles   Culture   Final    NO GROWTH 3 DAYS Performed at [facility], [place] [address], [place], [place] [address]    Report Status PENDING  Incomplete  Expectorated Sputum Assessment w Gram Stain, Rflx to Resp Cult     Status: None   Collection Time: 07/21/21  9:57 AM   Specimen: Expectorated Sputum  Result Value Ref Range Status   Specimen Description EXPECTORATED SPUTUM  Final   Special Requests NONE  Final   Sputum evaluation   Final    Sputum specimen not acceptable for testing.  Please recollect.    RESULT CALLED TO, READ BACK BY AND VERIFIED WITH: RN COURTNEY M. 07/22/21@2 :25 BY TW Performed at [facility], [place] [address] [place], [place] [address]    Report Status 07/23/2021 FINAL  Final      Radiology Studies: NM Pulmonary Perfusion  Result Date: 07/22/2021 CLINICAL DATA:  Shortness of breath.  Abnormal D-dimer. EXAM: NUCLEAR MEDICINE PERFUSION LUNG SCAN TECHNIQUE: Perfusion images were obtained in multiple projections after intravenous injection of radiopharmaceutical. Ventilation scans intentionally deferred if perfusion scan and chest x-ray adequate for interpretation during COVID 19 epidemic. RADIOPHARMACEUTICALS:  4.0 mCi Tc-25mMAA IV COMPARISON:  Chest radiography same day FINDINGS: Normal perfusion examination. No defects to suggest embolic disease. IMPRESSION: Normal nuclear medicine perfusion study. No sign of embolic disease. Electronically Signed   By: MNelson Chimes  M.D.   On: 07/22/2021 11:05   DG Chest Port 1 View  Result Date: 07/22/2021 CLINICAL DATA:  Shortness of breath. EXAM: PORTABLE CHEST 1 VIEW COMPARISON:  July 21, 2021. FINDINGS: Stable cardiomediastinal silhouette. No pneumothorax is noted. Significantly improved right lung opacity is noted. Continued left basilar atelectasis or edema is noted with small left pleural effusion. Bony thorax is unremarkable. IMPRESSION: Improved right lung opacity compared to prior exam. However, persistent left basilar atelectasis or edema is noted with small left pleural effusion. Electronically Signed   By: Marijo Conception M.D.   On: 07/22/2021 09:39   ECHOCARDIOGRAM COMPLETE  Result Date: 07/22/2021    ECHOCARDIOGRAM REPORT   Patient Name:   [place] DRAPER GALLON Date of Exam: 07/22/2021 Medical Rec #:  [id number]      Height:       69.0 in Accession #:    [id number]     Weight:       179.9 lb Date of Birth:  1946/01/15      BSA:          1.975 m Patient Age:    5 years       BP:           148/84 mmHg Patient Gender: M               HR:           88 bpm. Exam Location:  Inpatient Procedure: 2D Echo Indications:    CHF- Acute Systolic Z30.07  History:        Patient has prior history of Echocardiogram examinations, most                 recent 03/19/2018. Risk Factors:Dyslipidemia and Alcohol Abuse.  Sonographer:    Mikki Santee RDCS Referring Phys: [id number] RONDELL A SMITH IMPRESSIONS  1. Left ventricular ejection fraction, by estimation, is 70 to 75%. The left ventricle has hyperdynamic function. The left ventricle has no regional wall motion abnormalities. Left ventricular diastolic parameters are consistent with Grade I diastolic dysfunction (impaired relaxation).  2. Right ventricular systolic function is normal. The right ventricular size is normal.  3. The mitral valve is normal in structure. No evidence of mitral valve regurgitation. No evidence of mitral stenosis.  4. The aortic valve has an indeterminant number of cusps. Aortic valve regurgitation is not visualized. No aortic stenosis is present.  5. The inferior vena cava is normal in size with greater than 50% respiratory variability, suggesting right atrial pressure of 3 mmHg. FINDINGS  Left Ventricle: Left ventricular ejection fraction, by estimation, is 70 to 75%. The left ventricle has hyperdynamic function. The left ventricle has no regional wall motion abnormalities. The left ventricular internal cavity size was normal in size. There is no left ventricular hypertrophy. Left ventricular diastolic parameters are consistent with Grade I diastolic dysfunction (impaired relaxation). Right Ventricle: The right ventricular size is normal. Right ventricular systolic function is normal. Left Atrium: Left atrial size was normal in size. Right Atrium: Right atrial size was normal in size. Pericardium: Trivial pericardial effusion is present. Mitral Valve: The mitral valve is normal in structure. No evidence of mitral valve regurgitation. No evidence of mitral valve stenosis.  Tricuspid Valve: The tricuspid valve is normal in structure. Tricuspid valve regurgitation is trivial. No evidence of tricuspid stenosis. Aortic Valve: The aortic valve has an indeterminant number of cusps. Aortic valve regurgitation is not visualized. No aortic stenosis is present. Pulmonic Valve: The pulmonic valve was not  well visualized. Pulmonic valve regurgitation is not visualized. No evidence of pulmonic stenosis. Aorta: The aortic root is normal in size and structure. Venous: The inferior vena cava is normal in size with greater than 50% respiratory variability, suggesting right atrial pressure of 3 mmHg. IAS/Shunts: No atrial level shunt detected by color flow Doppler.  LEFT VENTRICLE PLAX 2D LVIDd:         4.70 cm   Diastology LVIDs:         3.00 cm   LV e' medial:    3.73 cm/s LV PW:         1.00 cm   LV E/e' medial:  19.6 LV IVS:        1.00 cm   LV e' lateral:   4.38 cm/s LVOT diam:     2.00 cm   LV E/e' lateral: 16.7 LV SV:         71 LV SV Index:   36 LVOT Area:     3.14 cm  RIGHT VENTRICLE RV S prime:     18.10 cm/s TAPSE (M-mode): 1.8 cm LEFT ATRIUM             Index        RIGHT ATRIUM           Index LA diam:        3.10 cm 1.57 cm/m   RA Area:     16.50 cm LA Vol (A2C):   53.5 ml 27.09 ml/m  RA Volume:   38.70 ml  19.59 ml/m LA Vol (A4C):   45.2 ml 22.89 ml/m LA Biplane Vol: 48.8 ml 24.71 ml/m  AORTIC VALVE LVOT Vmax:   119.00 cm/s LVOT Vmean:  83.000 cm/s LVOT VTI:    0.226 m  AORTA Ao Root diam: 2.70 cm Ao Asc diam:  2.90 cm MITRAL VALVE MV Area (PHT): 3.39 cm     SHUNTS MV Decel Time: 224 msec     Systemic VTI:  0.23 m MV E velocity: 73.10 cm/s   Systemic Diam: 2.00 cm MV A velocity: 108.00 cm/s MV E/A ratio:  0.68 Kirk Ruths MD Electronically signed by Kirk Ruths MD Signature Date/Time: 07/22/2021/12:17:15 PM    Final      LOS: 3 days   Antonieta Pert, MD [facility]  07/24/2021, 8:40 AM

## 2021-07-24 NOTE — Evaluation (Signed)
Occupational Therapy Evaluation Patient Details Name: Andre Ross MRN: [id number] DOB: [birth year] Today's Date: 07/24/2021   History of Present Illness Pt is a 75 y.o. male admitted on 07/21/21 with acute hypoxic respiratory failure due to CHF/COPD exacerbation. CXR noted cardiomegaly with vascular distention and intersitial edema. PMH includes: combined systolic/diastolic CHF, alcohol abuse, HLD, CKD stage III, TIA, left ventricular systolic dysfunction.   Clinical Impression   Pt was functioning modified independently in mobility with a cane and in ADL. He assists his wife who is disabled. Pt presents with decreased awareness of deficits and attention placing him at high risk for falls, although he mobilized with RW and min guard assist. Pt completed ADL with set up to min guard assist. Recommending HHOT to address safety in the home and further assess cognition during IADL. Will follow acutely.      Recommendations for follow up therapy are one component of a multi-disciplinary discharge planning process, led by the attending physician.  Recommendations may be updated based on patient status, additional functional criteria and insurance authorization.   Follow Up Recommendations  Home health OT    Assistance Recommended at Discharge Frequent or constant Supervision/Assistance  Functional Status Assessment  Patient has had a recent decline in their functional status and demonstrates the ability to make significant improvements in function in a reasonable and predictable amount of time.  Equipment Recommendations  None recommended by OT    Recommendations for Other Services       Precautions / Restrictions Precautions Precautions: Fall      Mobility Bed Mobility               General bed mobility comments: pt in chair    Transfers Overall transfer level: Needs assistance Equipment used: Rolling walker (2 wheels) Transfers: Sit to/from Stand Sit to Stand: Min guard            General transfer comment: cues for hand placement      Balance Overall balance assessment: Needs assistance   Sitting balance-Leahy Scale: Good Sitting balance - Comments: no LOB donning socks   Standing balance support: Bilateral upper extremity supported;Reliant on assistive device for balance Standing balance-Leahy Scale: Poor Standing balance comment: reliant on at least one hand support in static standing at sink                           ADL either performed or assessed with clinical judgement   ADL Overall ADL's : Needs assistance/impaired Eating/Feeding: Independent;Sitting   Grooming: Wash/dry hands;Wash/dry face;Sitting;Set up   Upper Body Bathing: Set up;Sitting   Lower Body Bathing: Min guard;Sit to/from stand   Upper Body Dressing : Set up;Sitting   Lower Body Dressing: Min guard;Sit to/from stand   Toilet Transfer: Min guard;Ambulation;BSC/3in1;Rolling walker (2 wheels)   Toileting- Clothing Manipulation and Hygiene: Min guard;Sit to/from stand       Functional mobility during ADLs: Min guard;Rolling walker (2 wheels)       Vision Baseline Vision/History: 1 Wears glasses Ability to See in Adequate Light: 0 Adequate Patient Visual Report: No change from baseline Additional Comments: glasses are at home     Perception     Praxis      Pertinent Vitals/Pain Pain Assessment: No/denies pain     Hand Dominance Right   Extremity/Trunk Assessment Upper Extremity Assessment Upper Extremity Assessment: Overall WFL for tasks assessed (tremulous)   Lower Extremity Assessment Lower Extremity Assessment: Defer to PT evaluation  Cervical / Trunk Assessment Cervical / Trunk Assessment: Kyphotic   Communication Communication Communication: HOH   Cognition Arousal/Alertness: Awake/alert Behavior During Therapy: WFL for tasks assessed/performed Overall Cognitive Status: Impaired/Different from baseline Area of Impairment:  Memory;Safety/judgement;Attention;Problem solving                   Current Attention Level: Selective Memory: Decreased short-term memory   Safety/Judgement: Decreased awareness of safety;Decreased awareness of deficits   Problem Solving: Difficulty sequencing;Requires verbal cues       General Comments       Exercises     Shoulder Instructions      Home Living Family/patient expects to be discharged to:: Private residence Living Arrangements: Spouse/significant other Available Help at Discharge: Available PRN/intermittently Type of Home: House Home Access: Ramped entrance     Home Layout: One level     Bathroom Shower/Tub: Occupational psychologist: Handicapped height     Home Equipment: [employer];Cane - single point;Shower seat;Rolling Environmental consultant (2 wheels)   Additional Comments: lives with wife who can offer minimal physical assistance, sister in law provides intermittent assistance      Prior Functioning/Environment Prior Level of Function : Independent/Modified Independent             Mobility Comments: Pt ambulated with a cane in the home. Doesn't walk much out of the home. Uses cart to lean on while grocery shopping. ADLs Comments: Modified independent in ADLs, sits to shower        OT Problem List: Decreased strength;Decreased activity tolerance;Impaired balance (sitting and/or standing);Decreased knowledge of use of DME or AE;Decreased coordination;Decreased cognition;Decreased safety awareness      OT Treatment/Interventions: Self-care/ADL training;DME and/or AE instruction;Cognitive remediation/compensation;Patient/family education;Balance training    OT Goals(Current goals can be found in the care plan section) Acute Rehab OT Goals OT Goal Formulation: With patient Time For Goal Achievement: 08/07/21 Potential to Achieve Goals: Good ADL Goals Pt Will Perform Grooming: with supervision;standing Pt Will Perform Lower  Body Bathing: with supervision;sit to/from stand Pt Will Perform Lower Body Dressing: with supervision;sit to/from stand Pt Will Transfer to Toilet: with supervision;ambulating;bedside commode Pt Will Perform Toileting - Clothing Manipulation and hygiene: with supervision;sit to/from stand Additional ADL Goal #1: Pt will state at least 3 methods for preventing falls.  OT Frequency: Min 2X/week   Barriers to D/C:            Co-evaluation              AM-PAC OT "6 Clicks" Daily Activity     Outcome Measure Help from another person eating meals?: None Help from another person taking care of personal grooming?: A Little Help from another person toileting, which includes using toliet, bedpan, or urinal?: A Little Help from another person bathing (including washing, rinsing, drying)?: A Little Help from another person to put on and taking off regular upper body clothing?: A Little Help from another person to put on and taking off regular lower body clothing?: A Little 6 Click Score: 19   End of Session Equipment Utilized During Treatment: Rolling walker (2 wheels) Nurse Communication: Mobility status  Activity Tolerance: Patient tolerated treatment well Patient left: in chair;with call bell/phone within reach;with bed alarm set  OT Visit Diagnosis: Unsteadiness on feet (R26.81);Other abnormalities of gait and mobility (R26.89);Muscle weakness (generalized) (M62.81);Other symptoms and signs involving cognitive function                Time: 2706-2376 OT Time Calculation (  min): 31 min Charges:  OT General Charges $OT Visit: 1 Visit OT Evaluation $OT Eval Moderate Complexity: 1 Mod OT Treatments $Self Care/Home Management : 8-22 mins  Andre Ross, OTR/L Acute Rehabilitation Services Pager: [phone] Office: [phone]   Andre Ross 07/24/2021, 3:40 PM

## 2021-07-24 NOTE — Plan of Care (Signed)

## 2021-07-24 NOTE — TOC Progression Note (Addendum)
Transition of Care [facility]) - Progression Note    Patient Details  Name: Andre Ross MRN: [id number] Date of Birth: April 26, 1946  Transition of [facility]) CM/SW Contact  Zenon Mayo, RN Phone Number: 07/24/2021, 3:31 PM  Clinical Narrative:    NCM spoke with patient at bedside, offered choice from Medicare.gov list, put copy on shadow chart,  he chose [facility].  NCM made referral to Lenore Manner for [facility] for CHF, [place], [place].  NCM asked  MD for [facility] orders.  Soc will begin 24 to 48 hrs post dc.  TOC will continue to follow for dc needs. NCM gave patient SA resources.   Expected Discharge Plan: [place] Barriers to Discharge: Continued Medical Work up  Expected Discharge Plan and Services Expected Discharge Plan: [place] In-house Referral: NA Discharge Planning Services: CM Consult Post Acute Care Choice: [place] arrangements for the past 2 months: Single Family Home                   DME Agency: NA       HH Arranged: RN, Disease Management, PT, OT HH Agency: [facility] Date HH Agency Contacted: 07/24/21 Time [place]: 5110 Representative spoke with at [place]: [place] ([place]) Interventions    Readmission Risk Interventions No flowsheet data found.

## 2021-07-24 NOTE — Telephone Encounter (Signed)
Error

## 2021-07-25 DIAGNOSIS — I160 Hypertensive urgency: Secondary | ICD-10-CM

## 2021-07-25 DIAGNOSIS — I5030 Unspecified diastolic (congestive) heart failure: Secondary | ICD-10-CM

## 2021-07-25 DIAGNOSIS — J9601 Acute respiratory failure with hypoxia: Secondary | ICD-10-CM

## 2021-07-25 LAB — BASIC METABOLIC PANEL
Anion gap: 9 (ref 5–15)
BUN: 40 mg/dL — ABNORMAL HIGH (ref 8–23)
CO2: 28 mmol/L (ref 22–32)
Calcium: 9.1 mg/dL (ref 8.9–10.3)
Chloride: 102 mmol/L (ref 98–111)
Creatinine, Ser: 1.42 mg/dL — ABNORMAL HIGH (ref 0.61–1.24)
GFR, Estimated: 51 mL/min — ABNORMAL LOW (ref >60)
Glucose, Bld: 95 mg/dL (ref 70–99)
Potassium: 4.4 mmol/L (ref 3.5–5.1)
Sodium: 139 mmol/L (ref 135–145)

## 2021-07-25 LAB — CBC
HCT: 43.2 % (ref 39.0–52.0)
Hemoglobin: 14.3 g/dL (ref 13.0–17.0)
MCH: 31.4 pg (ref 26.0–34.0)
MCHC: 33.1 g/dL (ref 30.0–36.0)
MCV: 94.7 fL (ref 80.0–100.0)
Platelets: 140 10*3/uL — ABNORMAL LOW (ref 150–400)
RBC: 4.56 MIL/uL (ref 4.22–5.81)
RDW: 13.2 % (ref 11.5–15.5)
WBC: 13.4 10*3/uL — ABNORMAL HIGH (ref 4.0–10.5)
nRBC: 0 % (ref 0.0–0.2)

## 2021-07-25 MED ORDER — AZITHROMYCIN 500 MG PO TABS
500.0000 mg | ORAL_TABLET | Freq: Every day | ORAL | 0 refills | Status: DC
Start: 2021-07-25 — End: 2022-05-18

## 2021-07-25 MED ORDER — AMLODIPINE BESYLATE 5 MG PO TABS
5.0000 mg | ORAL_TABLET | Freq: Every day | ORAL | Status: DC
  Administered 2021-07-25: 10:00:00 5 mg via ORAL

## 2021-07-25 MED ORDER — AZITHROMYCIN 250 MG PO TABS
500.0000 mg | ORAL_TABLET | Freq: Every day | ORAL | Status: DC
  Administered 2021-07-25: 08:00:00 500 mg via ORAL
  Filled 2021-07-25: qty 2

## 2021-07-25 MED ORDER — AMLODIPINE BESYLATE 5 MG PO TABS: 5.0000 mg | ORAL_TABLET | Freq: Every day | ORAL | 0 refills | Status: DC

## 2021-07-25 MED ORDER — PREDNISONE 20 MG PO TABS: 20.0000 mg | ORAL_TABLET | Freq: Every day | ORAL | 0 refills | Status: AC

## 2021-07-25 MED ORDER — CEPHALEXIN 500 MG PO CAPS: 500.0000 mg | ORAL_CAPSULE | Freq: Four times a day (QID) | ORAL | 0 refills | Status: AC

## 2021-07-25 NOTE — Progress Notes (Signed)
Mobility Specialist: Progress Note   07/25/21 1138  Mobility  Activity Ambulated in hall  Level of Assistance Contact guard assist, steadying assist  Assistive Device Front wheel walker  Distance Ambulated (ft) 120 ft  Mobility Ambulated with assistance in hallway  Mobility Response Tolerated well  Mobility performed by Mobility specialist  Bed Position Chair  $Mobility charge 1 Mobility   Pre-Mobility: 64 HR Post-Mobility: 65 HR  Pt standby assist to stand from EOB requiring x2 attempts to stand and contact guard during ambulation. Pt had no c/o throughout. Pt to recliner after walk with call bell at his side.   [facility] Carolyn Maniscalco Mobility Specialist Mobility Specialist Phone #1: [phone] Mobility Specialist Phone #2: [phone]

## 2021-07-25 NOTE — Discharge Summary (Signed)
Physician Discharge Summary  Andre [place] JXB:[id number] DOB: January 22, 1946 DOA: 07/21/2021  PCP: Nicoletta Dress, MD  Admit date: 07/21/2021 Discharge date: 07/25/2021  Admitted From: home Disposition:  hh  Recommendations for Outpatient Follow-up:  Follow up with PCP in 1-2 weeks Please obtain BMP/CBC in one week   Home Health:yes  Equipment/Devices: walker  Discharge Condition: Stable Code Status:   Code Status: DNR Diet recommendation:  Diet Order             Diet Heart Room service appropriate? Yes; Fluid consistency: Thin; Fluid restriction: 2000 mL Fluid  Diet effective now                    Brief/Interim Summary:  75 y.o. male with PMH of HTN, combined systolic and diastolic CHF, HLD, CKD stage III, PTSD, diverticular GI bleed, alcohol abuse, hard of hearing, uses walker at baseline, lives with wife who is debilitated , not on home o2, remote history of tobacco use currently drinking 2-3 drinks of rum, vodka and occasional beer 5 of 7 days presented with shortness of breath, respiratory distress.  Symptoms started with forced cough and intermittent nausea and when he woke up he was diaphoretic and feeling generalized weakness, too weak to stand up symptoms not improved despite using his inhalers.  Also complaining of leg swelling wheezing substernal chest pressure epigastric discomfort intermittent right shoulder pain.  He had a fall about 2 to 3 weeks ago after getting tripped in relation to his neuropathy.   In route w/ EMS hypoxic in 80%-placed on CPAP unable to tolerate with tachypneic in the 40s then transition to nonrebreather given DuoNeb Solu-Medrol.   In ED afebrile, tachypneic tachycardic hypotensive hypoxic as low as 80%, transitioned to BiPAP but unable to tolerate then subsequently to nasal cannula high flow 14 L.  Labs showed leukocytosis 15.9 hyperkalemia 5.9, AKI creatinine 1.3 BNP 571 high-sensitivity troponin 46> 45, lactic acid is 2.2 chest x-ray  cardiomegaly central vascular congestion generalized interstitial edema greater at the base and a small pleural effusion with atelectasis, pneumonia or airspace edema.  COVID influenza negative blood cultures and was given Lokelma magnesium IV Lasix 40 mg ceftriaxone/azithromycin Zofran bronchodilators and admitted for further ork-up-with working diagnosis of acute severe hypoxia with COPD and CHF exacerbations,, sepsis/CAP. Echo done shows EF 70 to 75%, VQ scan was negative  Discharge Diagnoses:  Acute hypoxic respiratory failure multifactorial due to CHF exacerbation, COPD exacerbation, pneumonia: Now resolved.  On room air.     Severe sepsis poa due to CAP:Met severe sepsis criteria with tachycardia tachypnea leukocytosis, lactic acidosis and respiratory failure.  Afebrile WBC count improving , lactic acidosis resolved, procalcitonin.0.13 to  < 0.1. Blood culture NGTD.we will discharge him on oral antibiotic follow-up with PCP   Acute COPD exacerbation/Remote tobacco abuse: Patient wheezing on admission.  Now improved, cont po steroid short course    Acute on chronic combined systolic and diastolic CHF Hypertension urgency: BNP in 500 -700  and BP in 207/124 on presentation.Last echo with EF 45% in 9/29- now  lvef70-75 %,no R WMA, G1 DD normal RV size and function, normal mitral valve aortic valve.  Off IV nitroglycerin.  Started on amlodipine, continue Imdur, propanolol(has essential tremor).  Blood pressure soft this morning and recheck improved to 120 we will continue on home Lasix, he will follow-up with PCP and cardiology as outpatient.  Continue salt restricted diet  Elevated troponin Chest pressure: Suspect demand ischemia in the setting of hypoxia  on admission.Troponin flat , no delta,echocardiogram w/  no RWMA. Continue patient's Plavix Imdur and statin - denies hx of Stent or MI.  Seen by cardiology no further ischemic work-up    AKI on CKD (chronic kidney disease) stage 3A:  Creatinine admission 1.0 previous baseline creatinine close to 1.4, peaked to 2.0 now improved  1.4-repeat BMP in 1 week from primary care doctor  Recent Labs  Lab 07/21/21 0433 07/22/21 0255 07/23/21 0053 07/24/21 0314 07/25/21 0640  BUN 26* 37* 53* 50* 40*  CREATININE 1.30* 2.03* 1.95* 1.52* 1.42*    HLD cont simvastatin   Alcohol abuse monitor for withdrawal, cont CIWA protocols thiamine folate.counseling done   Hyperkalemia: Resolved   Mildly elevated D-dimer:VQ- perfusion scan negative for PE   Nausea and abdominal pain epigastric: Lipase and LFT are normal continue PPI. Resolved,   PTSD continue home meds BPH continue Flomax Goals of care patient elected to be DNR.  Continue supportive care   Consults: cardiology Subjective: alert and oriented ambulating in the room on room air.  Agreeable for discharge home today  Discharge Exam: Vitals:   07/25/21 0816 07/25/21 0910  BP:  121/76  Pulse: 70 71  Resp: (!) 22 19  Temp:    SpO2: 95% 95%   General: Pt is alert, awake, not in acute distress Cardiovascular: RRR, S1/S2 +, no rubs, no gallops Respiratory: CTA bilaterally, no wheezing, no rhonchi Abdominal: Soft, NT, ND, bowel sounds + Extremities: no edema, no cyanosis  Discharge Instructions  Discharge Instructions     (HEART FAILURE PATIENTS) Call MD:  Anytime you have any of the following symptoms: 1) 3 pound weight gain in 24 hours or 5 pounds in 1 week 2) shortness of breath, with or without a dry hacking cough 3) swelling in the hands, feet or stomach 4) if you have to sleep on extra pillows at night in order to breathe.   Complete by: As directed    Discharge instructions   Complete by: As directed    Check CBC and BMP in 1 week Follow-up with your primary care doctor in 1 week Chest x-ray in 2 weeks  Please call call MD or return to ER for similar or worsening recurring problem that brought you to hospital or if any fever,nausea/vomiting,abdominal  pain, uncontrolled pain, chest pain,  shortness of breath or any other alarming symptoms.  Please follow-up your doctor as instructed in a week time and call the office for appointment.  Please avoid alcohol, smoking, or any other illicit substance and maintain healthy habits including taking your regular medications as prescribed.  You were cared for by a hospitalist during your hospital stay. If you have any questions about your discharge medications or the care you received while you were in the hospital after you are discharged, you can call the unit and ask to speak with the hospitalist on call if the hospitalist that took care of you is not available.  Once you are discharged, your primary care physician will handle any further medical issues. Please note that NO REFILLS for any discharge medications will be authorized once you are discharged, as it is imperative that you return to your primary care physician (or establish a relationship with a primary care physician if you do not have one) for your aftercare needs so that they can reassess your need for medications and monitor your lab values   Increase activity slowly   Complete by: As directed       Allergies  as of 07/25/2021       Reactions   Aspirin Other (See Comments)   GI upset   Hydrocodone Itching        Medication List     TAKE these medications    acetaminophen 500 MG tablet Commonly known as: TYLENOL Take 1 tablet by mouth 4 (four) times daily as needed for moderate pain or headache.   allopurinol 300 MG tablet Commonly known as: ZYLOPRIM Take 150 mg by mouth daily.   amLODipine 5 MG tablet Commonly known as: NORVASC Take 1 tablet (5 mg total) by mouth daily.   azithromycin 500 MG tablet Commonly known as: ZITHROMAX Take 1 tablet (500 mg total) by mouth daily.   Carboxymethylcellulose Sod PF 0.25 % Soln Apply 15 mLs to eye 4 (four) times daily as needed (dry eyes).   cephALEXin 500 MG  capsule Commonly known as: KEFLEX Take 1 capsule (500 mg total) by mouth 4 (four) times daily for 4 days.   citalopram 40 MG tablet Commonly known as: CELEXA Take 20 mg by mouth daily.   clopidogrel 75 MG tablet Commonly known as: PLAVIX Take 75 mg by mouth daily.   folic acid 1 MG tablet Commonly known as: FOLVITE Take 1 mg by mouth daily.   furosemide 40 MG tablet Commonly known as: LASIX Take 1 tablet (40 mg total) by mouth daily.   gabapentin 100 MG capsule Commonly known as: NEURONTIN Take 300 mg by mouth 3 (three) times daily.   isosorbide mononitrate 60 MG 24 hr tablet Commonly known as: IMDUR Take 60 mg by mouth daily.   loratadine 10 MG tablet Commonly known as: CLARITIN Take 10 mg by mouth daily as needed for allergies.   multivitamin tablet Take 1 tablet by mouth daily.   predniSONE 20 MG tablet Commonly known as: DELTASONE Take 1 tablet (20 mg total) by mouth daily with breakfast for 3 days.   propranolol ER 120 MG 24 hr capsule Commonly known as: INDERAL LA Take 120 mg by mouth daily.   simvastatin 20 MG tablet Commonly known as: ZOCOR Take 20 mg by mouth every evening.   Spiriva Respimat 1.25 MCG/ACT Aers Generic drug: Tiotropium Bromide Monohydrate Inhale 2 Pump into the lungs daily.   tamsulosin 0.4 MG Caps capsule Commonly known as: FLOMAX Take 0.4 mg by mouth every evening.   tobramycin-dexamethasone ophthalmic ointment Commonly known as: TOBRADEX Place 1 application into the right eye at bedtime as needed (irritation).   vitamin B-12 500 MCG tablet Commonly known as: CYANOCOBALAMIN Take 1,000 mcg by mouth daily.   Vitamin D 50 MCG (2000 UT) tablet Take 2,000 Units by mouth daily.        Follow-up Information     Nicoletta Dress, MD Follow up in 1 week(s).   Specialty: Internal Medicine Contact information: [place] [address] [phone]                Allergies  Allergen  Reactions   Aspirin Other (See Comments)    GI upset   Hydrocodone Itching    The results of significant diagnostics from this hospitalization (including imaging, microbiology, ancillary and laboratory) are listed below for reference.    Microbiology: Recent Results (from the past 240 hour(s))  Resp Panel by RT-PCR (Flu A&B, Covid) Nasopharyngeal Swab     Status: None   Collection Time: 07/21/21  4:23 AM   Specimen: Nasopharyngeal Swab; Nasopharyngeal(NP) swabs in vial transport medium  Result Value  Ref Range Status   SARS Coronavirus 2 by RT PCR NEGATIVE NEGATIVE Final    Comment: (NOTE) SARS-CoV-2 target nucleic acids are NOT DETECTED.  The SARS-CoV-2 RNA is generally detectable in upper respiratory specimens during the acute phase of infection. The lowest concentration of SARS-CoV-2 viral copies this assay can detect is 138 copies/mL. A negative result does not preclude SARS-Cov-2 infection and should not be used as the sole basis for treatment or other patient management decisions. A negative result may occur with  improper specimen collection/handling, submission of specimen other than nasopharyngeal swab, presence of viral mutation(s) within the areas targeted by this assay, and inadequate number of viral copies(<138 copies/mL). A negative result must be combined with clinical observations, patient history, and epidemiological information. The expected result is Negative.  Fact Sheet for Patients:  EntrepreneurPulse.com.au  Fact Sheet for Healthcare Providers:  IncredibleEmployment.be  This test is no t yet approved or cleared by the [place] FDA and  has been authorized for detection and/or diagnosis of SARS-CoV-2 by FDA under an Emergency Use Authorization (EUA). This EUA will remain  in effect (meaning this test can be used) for the duration of the COVID-19 declaration under Section 564(b)(1) of the Act, 21 U.S.C.section  360bbb-3(b)(1), unless the authorization is terminated  or revoked sooner.       Influenza A by PCR NEGATIVE NEGATIVE Final   Influenza B by PCR NEGATIVE NEGATIVE Final    Comment: (NOTE) The Xpert Xpress SARS-CoV-2/FLU/RSV plus assay is intended as an aid in the diagnosis of influenza from Nasopharyngeal swab specimens and should not be used as a sole basis for treatment. Nasal washings and aspirates are unacceptable for Xpert Xpress SARS-CoV-2/FLU/RSV testing.  Fact Sheet for Patients: EntrepreneurPulse.com.au  Fact Sheet for Healthcare Providers: IncredibleEmployment.be  This test is not yet approved or cleared by the [place] FDA and has been authorized for detection and/or diagnosis of SARS-CoV-2 by FDA under an Emergency Use Authorization (EUA). This EUA will remain in effect (meaning this test can be used) for the duration of the COVID-19 declaration under Section 564(b)(1) of the Act, 21 U.S.C. section 360bbb-3(b)(1), unless the authorization is terminated or revoked.  Performed at [facility], [place] [address], [place], [place] [address]   Culture, blood (routine x 2)     Status: None (Preliminary result)   Collection Time: 07/21/21  5:06 AM   Specimen: BLOOD  Result Value Ref Range Status   Specimen Description BLOOD RIGHT ANTECUBITAL  Final   Special Requests   Final    BOTTLES DRAWN AEROBIC AND ANAEROBIC Blood Culture adequate volume   Culture   Final    NO GROWTH 4 DAYS Performed at [facility], [place] [address], [place], [place] [address]    Report Status PENDING  Incomplete  Culture, blood (routine x 2)     Status: None (Preliminary result)   Collection Time: 07/21/21  5:25 AM   Specimen: BLOOD LEFT HAND  Result Value Ref Range Status   Specimen Description BLOOD LEFT HAND  Final   Special Requests   Final    BOTTLES DRAWN AEROBIC AND ANAEROBIC Blood Culture results may not be optimal due to an  inadequate volume of blood received in culture bottles   Culture   Final    NO GROWTH 4 DAYS Performed at [facility], [place] [address] [place],  [address]    Report Status PENDING  Incomplete  Expectorated Sputum Assessment w Gram Stain,  Rflx to Resp Cult     Status: None   Collection Time: 07/21/21  9:57 AM   Specimen: Expectorated Sputum  Result Value Ref Range Status   Specimen Description EXPECTORATED SPUTUM  Final   Special Requests NONE  Final   Sputum evaluation   Final    Sputum specimen not acceptable for testing.  Please recollect.   RESULT CALLED TO, READ BACK BY AND VERIFIED WITH: RN COURTNEY M. 07/22/21@2 :25 BY TW Performed at [facility], [place] [address] [place], [place] [address]    Report Status 07/23/2021 FINAL  Final    Procedures/Studies: NM Pulmonary Perfusion  Result Date: 07/22/2021 CLINICAL DATA:  Shortness of breath.  Abnormal D-dimer. EXAM: NUCLEAR MEDICINE PERFUSION LUNG SCAN TECHNIQUE: Perfusion images were obtained in multiple projections after intravenous injection of radiopharmaceutical. Ventilation scans intentionally deferred if perfusion scan and chest x-ray adequate for interpretation during COVID 19 epidemic. RADIOPHARMACEUTICALS:  4.0 mCi Tc-32mMAA IV COMPARISON:  Chest radiography same day FINDINGS: Normal perfusion examination. No defects to suggest embolic disease. IMPRESSION: Normal nuclear medicine perfusion study. No sign of embolic disease. Electronically Signed   By: MNelson ChimesM.D.   On: 07/22/2021 11:05   DG Chest Port 1 View  Result Date: 07/22/2021 CLINICAL DATA:  Shortness of breath. EXAM: PORTABLE CHEST 1 VIEW COMPARISON:  July 21, 2021. FINDINGS: Stable cardiomediastinal silhouette. No pneumothorax is noted. Significantly improved right lung opacity is noted. Continued left basilar atelectasis or edema is noted with small left pleural effusion. Bony thorax is unremarkable. IMPRESSION: Improved right lung opacity  compared to prior exam. However, persistent left basilar atelectasis or edema is noted with small left pleural effusion. Electronically Signed   By: JMarijo ConceptionM.D.   On: 07/22/2021 09:39   DG Chest Port 1 View  Result Date: 07/21/2021 CLINICAL DATA:  Shortness of breath. EXAM: PORTABLE CHEST 1 VIEW COMPARISON:  None. FINDINGS: Left CP angle was excluded from the exam. The heart is enlarged. There is generalized interstitial consolidation with a basilar predominance, probably due to interstitial edema with mild central vascular distention also noted. There are small pleural effusions. Mild aortic tortuosity. There is mild asymmetric elevation of the left hemidiaphragm. There are bibasilar opacities which could be atelectasis, airspace edema or pneumonia. The mid and upper lungs are clear of focal airspace infiltrates, with COPD changes. There is thoracic spondylosis. IMPRESSION: 1. Cardiomegaly with central vascular distension, with generalized interstitial edema greater in the bases. 2. Small pleural effusions, with bibasilar opacities which could be atelectasis, pneumonia or airspace edema. 3. Clinical correlation and radiographic follow-up recommended. Electronically Signed   By: KTelford NabM.D.   On: 07/21/2021 04:47   ECHOCARDIOGRAM COMPLETE  Result Date: 07/22/2021    ECHOCARDIOGRAM REPORT   Patient Name:   Andre NTARIQUE LOVEALLDate of Exam: 07/22/2021 Medical Rec #:  [id number]     Height:       69.0 in Accession #:    [id number]    Weight:       179.9 lb Date of Birth:  202-Nov-1947     BSA:          1.975 m Patient Age:    76years       BP:           148/84 mmHg Patient Gender: M              HR:  88 bpm. Exam Location:  Inpatient Procedure: 2D Echo Indications:    CHF- Acute Systolic J49.70  History:        Patient has prior history of Echocardiogram examinations, most                 recent 03/19/2018. Risk Factors:Dyslipidemia and Alcohol Abuse.  Sonographer:    Mikki Santee RDCS  Referring Phys: [id number] RONDELL A SMITH IMPRESSIONS  1. Left ventricular ejection fraction, by estimation, is 70 to 75%. The left ventricle has hyperdynamic function. The left ventricle has no regional wall motion abnormalities. Left ventricular diastolic parameters are consistent with Grade I diastolic dysfunction (impaired relaxation).  2. Right ventricular systolic function is normal. The right ventricular size is normal.  3. The mitral valve is normal in structure. No evidence of mitral valve regurgitation. No evidence of mitral stenosis.  4. The aortic valve has an indeterminant number of cusps. Aortic valve regurgitation is not visualized. No aortic stenosis is present.  5. The inferior vena cava is normal in size with greater than 50% respiratory variability, suggesting right atrial pressure of 3 mmHg. FINDINGS  Left Ventricle: Left ventricular ejection fraction, by estimation, is 70 to 75%. The left ventricle has hyperdynamic function. The left ventricle has no regional wall motion abnormalities. The left ventricular internal cavity size was normal in size. There is no left ventricular hypertrophy. Left ventricular diastolic parameters are consistent with Grade I diastolic dysfunction (impaired relaxation). Right Ventricle: The right ventricular size is normal. Right ventricular systolic function is normal. Left Atrium: Left atrial size was normal in size. Right Atrium: Right atrial size was normal in size. Pericardium: Trivial pericardial effusion is present. Mitral Valve: The mitral valve is normal in structure. No evidence of mitral valve regurgitation. No evidence of mitral valve stenosis. Tricuspid Valve: The tricuspid valve is normal in structure. Tricuspid valve regurgitation is trivial. No evidence of tricuspid stenosis. Aortic Valve: The aortic valve has an indeterminant number of cusps. Aortic valve regurgitation is not visualized. No aortic stenosis is present. Pulmonic Valve: The pulmonic valve  was not well visualized. Pulmonic valve regurgitation is not visualized. No evidence of pulmonic stenosis. Aorta: The aortic root is normal in size and structure. Venous: The inferior vena cava is normal in size with greater than 50% respiratory variability, suggesting right atrial pressure of 3 mmHg. IAS/Shunts: No atrial level shunt detected by color flow Doppler.  LEFT VENTRICLE PLAX 2D LVIDd:         4.70 cm   Diastology LVIDs:         3.00 cm   LV e' medial:    3.73 cm/s LV PW:         1.00 cm   LV E/e' medial:  19.6 LV IVS:        1.00 cm   LV e' lateral:   4.38 cm/s LVOT diam:     2.00 cm   LV E/e' lateral: 16.7 LV SV:         71 LV SV Index:   36 LVOT Area:     3.14 cm  RIGHT VENTRICLE RV S prime:     18.10 cm/s TAPSE (M-mode): 1.8 cm LEFT ATRIUM             Index        RIGHT ATRIUM           Index LA diam:        3.10 cm 1.57 cm/m   RA Area:  16.50 cm LA Vol (A2C):   53.5 ml 27.09 ml/m  RA Volume:   38.70 ml  19.59 ml/m LA Vol (A4C):   45.2 ml 22.89 ml/m LA Biplane Vol: 48.8 ml 24.71 ml/m  AORTIC VALVE LVOT Vmax:   119.00 cm/s LVOT Vmean:  83.000 cm/s LVOT VTI:    0.226 m  AORTA Ao Root diam: 2.70 cm Ao Asc diam:  2.90 cm MITRAL VALVE MV Area (PHT): 3.39 cm     SHUNTS MV Decel Time: 224 msec     Systemic VTI:  0.23 m MV E velocity: 73.10 cm/s   Systemic Diam: 2.00 cm MV A velocity: 108.00 cm/s MV E/A ratio:  0.68 Kirk Ruths MD Electronically signed by Kirk Ruths MD Signature Date/Time: 07/22/2021/12:17:15 PM    Final     Labs: BNP (last 3 results) Recent Labs    07/21/21 0423 07/22/21 0828  BNP 571.5* 588.5*   Basic Metabolic Panel: Recent Labs  Lab 07/21/21 0423 07/21/21 0433 07/21/21 0530 07/21/21 1027 07/22/21 0255 07/23/21 0053 07/24/21 0314 07/25/21 0640  NA 140 143  143  --   --  136 134* 137 139  K 5.9* 5.9*  5.9* 5.4*  --  4.4 4.2 4.0 4.4  CL 107 108  --   --  102 100 103 102  CO2 22  --   --   --  24 25 24 28   GLUCOSE 134* 132*  --   --  158* 147* 118*  95  BUN 22 26*  --   --  37* 53* 50* 40*  CREATININE 1.32* 1.30*  --   --  2.03* 1.95* 1.52* 1.42*  CALCIUM 9.6  --   --   --  9.3 8.7* 9.0 9.1  MG  --   --   --  1.8  --   --   --   --   PHOS  --   --   --  3.3  --   --   --   --    Liver Function Tests: Recent Labs  Lab 07/21/21 0423  AST 39  ALT 23  ALKPHOS 79  BILITOT 1.2  PROT 7.6  ALBUMIN 4.2   Recent Labs  Lab 07/21/21 1027  LIPASE 47   No results for input(s): AMMONIA in the last 168 hours. CBC: Recent Labs  Lab 07/21/21 0423 07/21/21 0433 07/22/21 0255 07/23/21 0053 07/24/21 0314 07/25/21 0640  WBC 15.9*  --  12.6* 17.0* 16.0* 13.4*  NEUTROABS 13.1*  --   --   --   --   --   HGB 16.4 17.3*  17.0 13.6 12.0* 13.4 14.3  HCT 50.5 51.0  50.0 40.9 35.4* 38.8* 43.2  MCV 97.5  --  94.9 92.2 92.2 94.7  PLT 187  --  155 150 139* 140*   Cardiac Enzymes: No results for input(s): CKTOTAL, CKMB, CKMBINDEX, TROPONINI in the last 168 hours. BNP: Invalid input(s): POCBNP CBG: No results for input(s): GLUCAP in the last 168 hours. D-Dimer No results for input(s): DDIMER in the last 72 hours. Hgb A1c No results for input(s): HGBA1C in the last 72 hours. Lipid Profile No results for input(s): CHOL, HDL, LDLCALC, TRIG, CHOLHDL, LDLDIRECT in the last 72 hours. Thyroid function studies No results for input(s): TSH, T4TOTAL, T3FREE, THYROIDAB in the last 72 hours.  Invalid input(s): FREET3 Anemia work up No results for input(s): VITAMINB12, FOLATE, FERRITIN, TIBC, IRON, RETICCTPCT in the last 72 hours. Urinalysis No results found  for: COLORURINE, APPEARANCEUR, [place], [place], GLUCOSEU, HGBUR, BILIRUBINUR, [place], PROTEINUR, UROBILINOGEN, NITRITE, LEUKOCYTESUR Sepsis Labs Invalid input(s): PROCALCITONIN,  WBC,  LACTICIDVEN Microbiology Recent Results (from the past 240 hour(s))  Resp Panel by RT-PCR (Flu A&B, Covid) Nasopharyngeal Swab     Status: None   Collection Time: 07/21/21  4:23 AM   Specimen:  Nasopharyngeal Swab; Nasopharyngeal(NP) swabs in vial transport medium  Result Value Ref Range Status   SARS Coronavirus 2 by RT PCR NEGATIVE NEGATIVE Final    Comment: (NOTE) SARS-CoV-2 target nucleic acids are NOT DETECTED.  The SARS-CoV-2 RNA is generally detectable in upper respiratory specimens during the acute phase of infection. The lowest concentration of SARS-CoV-2 viral copies this assay can detect is 138 copies/mL. A negative result does not preclude SARS-Cov-2 infection and should not be used as the sole basis for treatment or other patient management decisions. A negative result may occur with  improper specimen collection/handling, submission of specimen other than nasopharyngeal swab, presence of viral mutation(s) within the areas targeted by this assay, and inadequate number of viral copies(<138 copies/mL). A negative result must be combined with clinical observations, patient history, and epidemiological information. The expected result is Negative.  Fact Sheet for Patients:  EntrepreneurPulse.com.au  Fact Sheet for Healthcare Providers:  IncredibleEmployment.be  This test is no t yet approved or cleared by the [place] FDA and  has been authorized for detection and/or diagnosis of SARS-CoV-2 by FDA under an Emergency Use Authorization (EUA). This EUA will remain  in effect (meaning this test can be used) for the duration of the COVID-19 declaration under Section 564(b)(1) of the Act, 21 U.S.C.section 360bbb-3(b)(1), unless the authorization is terminated  or revoked sooner.       Influenza A by PCR NEGATIVE NEGATIVE Final   Influenza B by PCR NEGATIVE NEGATIVE Final    Comment: (NOTE) The Xpert Xpress SARS-CoV-2/FLU/RSV plus assay is intended as an aid in the diagnosis of influenza from Nasopharyngeal swab specimens and should not be used as a sole basis for treatment. Nasal washings and aspirates are unacceptable for  Xpert Xpress SARS-CoV-2/FLU/RSV testing.  Fact Sheet for Patients: EntrepreneurPulse.com.au  Fact Sheet for Healthcare Providers: IncredibleEmployment.be  This test is not yet approved or cleared by the [place] FDA and has been authorized for detection and/or diagnosis of SARS-CoV-2 by FDA under an Emergency Use Authorization (EUA). This EUA will remain in effect (meaning this test can be used) for the duration of the COVID-19 declaration under Section 564(b)(1) of the Act, 21 U.S.C. section 360bbb-3(b)(1), unless the authorization is terminated or revoked.  Performed at [facility], [place] [address] [place], [place] [address]   Culture, blood (routine x 2)     Status: None (Preliminary result)   Collection Time: 07/21/21  5:06 AM   Specimen: BLOOD  Result Value Ref Range Status   Specimen Description BLOOD RIGHT ANTECUBITAL  Final   Special Requests   Final    BOTTLES DRAWN AEROBIC AND ANAEROBIC Blood Culture adequate volume   Culture   Final    NO GROWTH 4 DAYS Performed at [facility], [place] [address], [place], [place] [address]    Report Status PENDING  Incomplete  Culture, blood (routine x 2)     Status: None (Preliminary result)   Collection Time: 07/21/21  5:25 AM   Specimen: BLOOD LEFT HAND  Result Value Ref Range Status   Specimen Description BLOOD LEFT HAND  Final   Special Requests  Final    BOTTLES DRAWN AEROBIC AND ANAEROBIC Blood Culture results may not be optimal due to an inadequate volume of blood received in culture bottles   Culture   Final    NO GROWTH 4 DAYS Performed at [facility], [place] [address], [place], [place] [address]    Report Status PENDING  Incomplete  Expectorated Sputum Assessment w Gram Stain, Rflx to Resp Cult     Status: None   Collection Time: 07/21/21  9:57 AM   Specimen: Expectorated Sputum  Result Value Ref Range Status   Specimen Description EXPECTORATED SPUTUM  Final    Special Requests NONE  Final   Sputum evaluation   Final    Sputum specimen not acceptable for testing.  Please recollect.   RESULT CALLED TO, READ BACK BY AND VERIFIED WITH: RN COURTNEY M. 07/22/21@2 :25 BY TW Performed at [facility], [place] [address], [place], [place] [address]    Report Status 07/23/2021 FINAL  Final     Time coordinating discharge: 35 minutes  SIGNED: Antonieta Pert, MD  [facility] 07/25/2021, 10:00 AM  If 7PM-7AM, please contact night-coverage [email or web address]

## 2021-07-25 NOTE — TOC Transition Note (Signed)
Transition of Care [facility]) - CM/SW Discharge Note   Patient Details  Name: Spenser Harren MRN: [id number] Date of Birth: Dec 23, 1945  Transition of Care [facility] CM/SW Contact:  Carles Collet, RN Phone Number: 07/25/2021, 10:34 AM   Clinical Narrative:    Spoke with patient over the phone. He states that he will follow up with the [facility] for his walker, his wife will be bringing him a cane for discharge. HH has been set up with [facility], liaison notified of DC today. No other CM needs identified.     Final next level of care: [place] Barriers to Discharge: Continued Medical Work up   Patient Goals and CMS Choice Patient states their goals for this hospitalization and ongoing recovery are:: return home CMS Medicare.gov Compare Post Acute Care list provided to:: Patient Choice offered to / list presented to : Patient  Discharge Placement                       Discharge Plan and Services In-house Referral: NA Discharge Planning Services: CM Consult Post Acute Care Choice: Home Health            DME Agency: NA       HH Arranged: RN, Disease Management, PT, OT HH Agency: [facility] Date [place]: 07/24/21 Time [place]: [id number] Representative spoke with at [place]: [place] ([place]) Interventions     Readmission Risk Interventions No flowsheet data found.

## 2021-07-25 NOTE — Progress Notes (Signed)
Progress Note  Patient Name: Andre Ross Date of Encounter: 07/25/2021  [facility]ologist[facility] None [facility]  Subjective   Feeling much better, breathing improved.   Inpatient Medications    Scheduled Meds:  allopurinol  150 mg Oral Daily   amLODipine  10 mg Oral Daily   azithromycin  500 mg Oral Daily   clopidogrel  75 mg Oral Daily   enoxaparin (LOVENOX) injection  40 mg Subcutaneous V25D   folic acid  1 mg Oral Daily   furosemide  20 mg Intravenous Daily   gabapentin  300 mg Oral TID   ipratropium-albuterol  3 mL Nebulization QID   isosorbide mononitrate  60 mg Oral Daily   multivitamin with minerals  1 tablet Oral Daily   pantoprazole  40 mg Oral BID   predniSONE  40 mg Oral Q breakfast   propranolol ER  120 mg Oral Daily   simvastatin  20 mg Oral QPM   sodium chloride flush  3 mL Intravenous Q12H   tamsulosin  0.4 mg Oral QPM   thiamine  100 mg Oral Daily   Or   thiamine  100 mg Intravenous Daily   Continuous Infusions:  cefTRIAXone (ROCEPHIN)  IV 2 g (07/24/21 1014)   PRN Meds: acetaminophen **OR** acetaminophen, albuterol, hydrALAZINE, loratadine, ondansetron **OR** ondansetron (ZOFRAN) IV, polyvinyl alcohol   Vital Signs    Vitals:   07/25/21 0031 07/25/21 0400 07/25/21 0800 07/25/21 0816  BP: (!) 158/94 (!) 138/94 (!) 88/57   Pulse: 62 64 64 70  Resp: 19 12 18  (!) 22  Temp: 98 F (36.7 C) 97.8 F (36.6 C) 97.9 F (36.6 C)   TempSrc: Oral Oral Oral   SpO2: 95% 94% 95% 95%  Weight:  84.5 kg    Height:        Intake/Output Summary (Last 24 hours) at 07/25/2021 0905 Last data filed at 07/25/2021 0408 Gross per 24 hour  Intake 372.65 ml  Output 2425 ml  Net -2052.35 ml   Last 3 Weights 07/25/2021 07/24/2021 07/23/2021  Weight (lbs) 186 lb 4.6 oz 191 lb 9.3 oz 185 lb 10 oz  Weight (kg) 84.5 kg 86.9 kg 84.2 kg      Telemetry    SR with Trigeminy - Personally Reviewed  ECG    No new tracing   Physical Exam   GEN: No acute distress.    Neck: No JVD Cardiac: RRR, no murmurs, rubs, or gallops.  Respiratory: Clear to auscultation bilaterally. GI: Soft, nontender, non-distended  MS: No edema; No deformity. Neuro:  Nonfocal  Psych: Normal affect   Labs    High Sensitivity Troponin:   Recent Labs  Lab 07/21/21 0423 07/21/21 0530 07/24/21 0855 07/24/21 1105  TROPONINIHS 46* 45* 55* 58*     Chemistry Recent Labs  Lab 07/21/21 0423 07/21/21 0433 07/21/21 1027 07/22/21 0255 07/23/21 0053 07/24/21 0314 07/25/21 0640  NA 140   < >  --    < > 134* 137 139  K 5.9*   < >  --    < > 4.2 4.0 4.4  CL 107   < >  --    < > 100 103 102  CO2 22  --   --    < > 25 24 28   GLUCOSE 134*   < >  --    < > 147* 118* 95  BUN 22   < >  --    < > 53* 50* 40*  CREATININE  1.32*   < >  --    < > 1.95* 1.52* 1.42*  CALCIUM 9.6  --   --    < > 8.7* 9.0 9.1  MG  --   --  1.8  --   --   --   --   PROT 7.6  --   --   --   --   --   --   ALBUMIN 4.2  --   --   --   --   --   --   AST 39  --   --   --   --   --   --   ALT 23  --   --   --   --   --   --   ALKPHOS 79  --   --   --   --   --   --   BILITOT 1.2  --   --   --   --   --   --   GFRNONAA 56*  --   --    < > 35* 47* 51*  ANIONGAP 11  --   --    < > 9 10 9    < > = values in this interval not displayed.    Lipids No results for input(s): CHOL, TRIG, HDL, LABVLDL, LDLCALC, CHOLHDL in the last 168 hours.  Hematology Recent Labs  Lab 07/23/21 0053 07/24/21 0314 07/25/21 0640  WBC 17.0* 16.0* 13.4*  RBC 3.84* 4.21* 4.56  HGB 12.0* 13.4 14.3  HCT 35.4* 38.8* 43.2  MCV 92.2 92.2 94.7  MCH 31.3 31.8 31.4  MCHC 33.9 34.5 33.1  RDW 13.2 13.2 13.2  PLT 150 139* 140*   Thyroid  Recent Labs  Lab 07/21/21 1027  TSH 2.519    BNP Recent Labs  Lab 07/21/21 0423 07/22/21 0828  BNP 571.5* 700.1*    DDimer  Recent Labs  Lab 07/21/21 1027  DDIMER 1.64*     Radiology    No results found.  Cardiac Studies   Echo: 07/22/21   IMPRESSIONS     1. Left  ventricular ejection fraction, by estimation, is 70 to 75%. The  left ventricle has hyperdynamic function. The left ventricle has no  regional wall motion abnormalities. Left ventricular diastolic parameters  are consistent with Grade I diastolic  dysfunction (impaired relaxation).   2. Right ventricular systolic function is normal. The right ventricular  size is normal.   3. The mitral valve is normal in structure. No evidence of mitral valve  regurgitation. No evidence of mitral stenosis.   4. The aortic valve has an indeterminant number of cusps. Aortic valve  regurgitation is not visualized. No aortic stenosis is present.   5. The inferior vena cava is normal in size with greater than 50%  respiratory variability, suggesting right atrial pressure of 3 mmHg.   FINDINGS   Left Ventricle: Left ventricular ejection fraction, by estimation, is 70  to 75%. The left ventricle has hyperdynamic function. The left ventricle  has no regional wall motion abnormalities. The left ventricular internal  cavity size was normal in size.  There is no left ventricular hypertrophy. Left ventricular diastolic  parameters are consistent with Grade I diastolic dysfunction (impaired  relaxation).   Right Ventricle: The right ventricular size is normal. Right ventricular  systolic function is normal.   Left Atrium: Left atrial size was normal in size.   Right Atrium: Right atrial size was normal  in size.   Pericardium: Trivial pericardial effusion is present.   Mitral Valve: The mitral valve is normal in structure. No evidence of  mitral valve regurgitation. No evidence of mitral valve stenosis.   Tricuspid Valve: The tricuspid valve is normal in structure. Tricuspid  valve regurgitation is trivial. No evidence of tricuspid stenosis.   Aortic Valve: The aortic valve has an indeterminant number of cusps.  Aortic valve regurgitation is not visualized. No aortic stenosis is  present.   Pulmonic  Valve: The pulmonic valve was not well visualized. Pulmonic valve  regurgitation is not visualized. No evidence of pulmonic stenosis.   Aorta: The aortic root is normal in size and structure.   Venous: The inferior vena cava is normal in size with greater than 50%  respiratory variability, suggesting right atrial pressure of 3 mmHg.   IAS/Shunts: No atrial level shunt detected by color flow Doppler.   Patient Profile     75 y.o. male  with a hx of hypertension, TIA, CKD III, EtOH use, HFpEF, COPD?, hx of tobacco use who is being seen 07/24/2021 for the evaluation of hypertensive urgency/volume overload in the setting of pneumonia at the request of Dr. Lupita Leash.  Assessment & Plan    Acute Hypoxic Respiratory Failure Secondary to CAP: Sepsis criteria on admission with elevated lactic acid and WBC.  Treated with IV antibiotics.  Blood cultures negative to date.  Has been weaned to room air. --Per primary   HFpEF: Echo this admission showed LVEF of 70 to 75% with hyperdynamic function, no regional wall motion abnormality, grade 1 diastolic dysfunction, normal RV.  BNP elevated on admission as well as small bilateral effusions on chest x-ray.  He has been diuresed with IV Lasix, net -4.2 L, feeling much better.  -- transition to home dose of 40mg  oral lasix daily at discharge   Hypertensive urgency: PTA meds include propanolol and Imdur, though sounds he has been taking these intermittent. --on propranolol  --currently on amlodipine 10mg  daily as well as Imdur --discussed with the patient the need for BP monitoring at home as well as medication compliance.   Mildly elevated troponin/chest discomfort: High-sensitivity troponin on admission 46>>45.  EKG without acute ischemia.  This is in the setting of pneumonia, therefore suspect demand ischemia. -- Echo with hyperdynamic function, no regional wall motion abnormality.   CKD stage III: Cr 1.32>>2.03>>1.95>>1.52>>1.42 -- no room for ACE/ARB at this  time   HLD: on statin   ETOH use: CIWA per primary  He wants to continue to follow through the [place] at discharge.   For questions or updates, please contact [place] Please consult [email or web address] for contact info under        Signed, Reino Bellis, NP  07/25/2021, 9:05 AM

## 2021-07-26 LAB — CULTURE, BLOOD (ROUTINE X 2)
Culture: NO GROWTH
Culture: NO GROWTH
Special Requests: ADEQUATE

## 2021-07-30 DIAGNOSIS — I5043 Acute on chronic combined systolic (congestive) and diastolic (congestive) heart failure: Secondary | ICD-10-CM | POA: Diagnosis not present

## 2021-07-30 DIAGNOSIS — A419 Sepsis, unspecified organism: Secondary | ICD-10-CM | POA: Diagnosis not present

## 2021-07-30 DIAGNOSIS — N179 Acute kidney failure, unspecified: Secondary | ICD-10-CM | POA: Diagnosis not present

## 2021-07-30 DIAGNOSIS — N189 Chronic kidney disease, unspecified: Secondary | ICD-10-CM | POA: Diagnosis not present

## 2021-07-30 DIAGNOSIS — I160 Hypertensive urgency: Secondary | ICD-10-CM | POA: Diagnosis not present

## 2021-07-30 DIAGNOSIS — R778 Other specified abnormalities of plasma proteins: Secondary | ICD-10-CM | POA: Diagnosis not present

## 2021-07-30 DIAGNOSIS — J189 Pneumonia, unspecified organism: Secondary | ICD-10-CM | POA: Diagnosis not present

## 2021-07-30 DIAGNOSIS — J441 Chronic obstructive pulmonary disease with (acute) exacerbation: Secondary | ICD-10-CM | POA: Diagnosis not present

## 2021-07-30 DIAGNOSIS — J9601 Acute respiratory failure with hypoxia: Secondary | ICD-10-CM | POA: Diagnosis not present

## 2021-09-03 DIAGNOSIS — J449 Chronic obstructive pulmonary disease, unspecified: Secondary | ICD-10-CM | POA: Diagnosis not present

## 2021-09-03 DIAGNOSIS — R7301 Impaired fasting glucose: Secondary | ICD-10-CM | POA: Diagnosis not present

## 2021-09-03 DIAGNOSIS — C61 Malignant neoplasm of prostate: Secondary | ICD-10-CM | POA: Diagnosis not present

## 2021-09-03 DIAGNOSIS — M109 Gout, unspecified: Secondary | ICD-10-CM | POA: Diagnosis not present

## 2021-09-03 DIAGNOSIS — D62 Acute posthemorrhagic anemia: Secondary | ICD-10-CM | POA: Diagnosis not present

## 2021-09-03 DIAGNOSIS — E785 Hyperlipidemia, unspecified: Secondary | ICD-10-CM | POA: Diagnosis not present

## 2021-09-03 DIAGNOSIS — Z125 Encounter for screening for malignant neoplasm of prostate: Secondary | ICD-10-CM | POA: Diagnosis not present

## 2021-09-03 DIAGNOSIS — I10 Essential (primary) hypertension: Secondary | ICD-10-CM | POA: Diagnosis not present

## 2021-09-03 DIAGNOSIS — E039 Hypothyroidism, unspecified: Secondary | ICD-10-CM | POA: Diagnosis not present

## 2021-09-03 DIAGNOSIS — I5042 Chronic combined systolic (congestive) and diastolic (congestive) heart failure: Secondary | ICD-10-CM | POA: Diagnosis not present

## 2022-01-12 DIAGNOSIS — I361 Nonrheumatic tricuspid (valve) insufficiency: Secondary | ICD-10-CM | POA: Diagnosis not present

## 2022-01-12 DIAGNOSIS — I493 Ventricular premature depolarization: Secondary | ICD-10-CM | POA: Diagnosis not present

## 2022-01-12 DIAGNOSIS — I272 Pulmonary hypertension, unspecified: Secondary | ICD-10-CM | POA: Diagnosis not present

## 2022-04-18 DIAGNOSIS — Z9181 History of falling: Secondary | ICD-10-CM | POA: Diagnosis not present

## 2022-04-18 DIAGNOSIS — Z139 Encounter for screening, unspecified: Secondary | ICD-10-CM | POA: Diagnosis not present

## 2022-04-18 DIAGNOSIS — Z0000 Encounter for general adult medical examination without abnormal findings: Secondary | ICD-10-CM | POA: Diagnosis not present

## 2022-04-18 DIAGNOSIS — E785 Hyperlipidemia, unspecified: Secondary | ICD-10-CM | POA: Diagnosis not present

## 2022-04-18 DIAGNOSIS — Z1331 Encounter for screening for depression: Secondary | ICD-10-CM | POA: Diagnosis not present

## 2022-04-30 DIAGNOSIS — H6501 Acute serous otitis media, right ear: Secondary | ICD-10-CM | POA: Diagnosis not present

## 2022-05-10 DIAGNOSIS — I214 Non-ST elevation (NSTEMI) myocardial infarction: Secondary | ICD-10-CM | POA: Diagnosis not present

## 2022-05-11 DIAGNOSIS — I214 Non-ST elevation (NSTEMI) myocardial infarction: Secondary | ICD-10-CM | POA: Diagnosis not present

## 2022-05-11 DIAGNOSIS — I10 Essential (primary) hypertension: Secondary | ICD-10-CM | POA: Diagnosis not present

## 2022-05-11 DIAGNOSIS — J441 Chronic obstructive pulmonary disease with (acute) exacerbation: Secondary | ICD-10-CM | POA: Diagnosis not present

## 2022-05-11 DIAGNOSIS — I340 Nonrheumatic mitral (valve) insufficiency: Secondary | ICD-10-CM | POA: Diagnosis not present

## 2022-05-12 DIAGNOSIS — I10 Essential (primary) hypertension: Secondary | ICD-10-CM | POA: Diagnosis not present

## 2022-05-12 DIAGNOSIS — J441 Chronic obstructive pulmonary disease with (acute) exacerbation: Secondary | ICD-10-CM | POA: Diagnosis not present

## 2022-05-12 DIAGNOSIS — I214 Non-ST elevation (NSTEMI) myocardial infarction: Secondary | ICD-10-CM | POA: Diagnosis not present

## 2022-05-13 DIAGNOSIS — I214 Non-ST elevation (NSTEMI) myocardial infarction: Secondary | ICD-10-CM | POA: Diagnosis not present

## 2022-05-13 DIAGNOSIS — J441 Chronic obstructive pulmonary disease with (acute) exacerbation: Secondary | ICD-10-CM | POA: Diagnosis not present

## 2022-05-13 DIAGNOSIS — I10 Essential (primary) hypertension: Secondary | ICD-10-CM | POA: Diagnosis not present

## 2022-05-14 DIAGNOSIS — I10 Essential (primary) hypertension: Secondary | ICD-10-CM | POA: Diagnosis not present

## 2022-05-14 DIAGNOSIS — I5043 Acute on chronic combined systolic (congestive) and diastolic (congestive) heart failure: Secondary | ICD-10-CM | POA: Diagnosis not present

## 2022-05-14 DIAGNOSIS — I214 Non-ST elevation (NSTEMI) myocardial infarction: Secondary | ICD-10-CM | POA: Diagnosis not present

## 2022-05-14 DIAGNOSIS — J441 Chronic obstructive pulmonary disease with (acute) exacerbation: Secondary | ICD-10-CM | POA: Diagnosis not present

## 2022-05-15 ENCOUNTER — Other Ambulatory Visit: Payer: Self-pay

## 2022-05-15 ENCOUNTER — Inpatient Hospital Stay (HOSPITAL_COMMUNITY)
Admission: AD | Admit: 2022-05-15 | Discharge: 2022-05-18 | DRG: 246 | Disposition: A | Discharge status: Skilled Nursing Facility | Payer: No Typology Code available for payment source | Source: Other Acute Inpatient Hospital | Attending: Cardiology | Admitting: Cardiology

## 2022-05-15 DIAGNOSIS — E785 Hyperlipidemia, unspecified: Secondary | ICD-10-CM | POA: Diagnosis present

## 2022-05-15 DIAGNOSIS — N400 Enlarged prostate without lower urinary tract symptoms: Secondary | ICD-10-CM | POA: Diagnosis present

## 2022-05-15 DIAGNOSIS — I5031 Acute diastolic (congestive) heart failure: Secondary | ICD-10-CM | POA: Diagnosis not present

## 2022-05-15 DIAGNOSIS — N183 Chronic kidney disease, stage 3 unspecified: Secondary | ICD-10-CM | POA: Diagnosis present

## 2022-05-15 DIAGNOSIS — Z79899 Other long term (current) drug therapy: Secondary | ICD-10-CM | POA: Diagnosis not present

## 2022-05-15 DIAGNOSIS — G4700 Insomnia, unspecified: Secondary | ICD-10-CM | POA: Diagnosis present

## 2022-05-15 DIAGNOSIS — F329 Major depressive disorder, single episode, unspecified: Secondary | ICD-10-CM | POA: Diagnosis present

## 2022-05-15 DIAGNOSIS — Z9049 Acquired absence of other specified parts of digestive tract: Secondary | ICD-10-CM

## 2022-05-15 DIAGNOSIS — Z87891 Personal history of nicotine dependence: Secondary | ICD-10-CM

## 2022-05-15 DIAGNOSIS — Z23 Encounter for immunization: Secondary | ICD-10-CM | POA: Diagnosis not present

## 2022-05-15 DIAGNOSIS — R2681 Unsteadiness on feet: Secondary | ICD-10-CM | POA: Diagnosis present

## 2022-05-15 DIAGNOSIS — Z885 Allergy status to narcotic agent status: Secondary | ICD-10-CM

## 2022-05-15 DIAGNOSIS — I25110 Atherosclerotic heart disease of native coronary artery with unstable angina pectoris: Secondary | ICD-10-CM | POA: Diagnosis not present

## 2022-05-15 DIAGNOSIS — I2510 Atherosclerotic heart disease of native coronary artery without angina pectoris: Secondary | ICD-10-CM | POA: Diagnosis present

## 2022-05-15 DIAGNOSIS — I214 Non-ST elevation (NSTEMI) myocardial infarction: Secondary | ICD-10-CM | POA: Diagnosis not present

## 2022-05-15 DIAGNOSIS — Z8249 Family history of ischemic heart disease and other diseases of the circulatory system: Secondary | ICD-10-CM

## 2022-05-15 DIAGNOSIS — Z8711 Personal history of peptic ulcer disease: Secondary | ICD-10-CM

## 2022-05-15 DIAGNOSIS — M109 Gout, unspecified: Secondary | ICD-10-CM | POA: Diagnosis present

## 2022-05-15 DIAGNOSIS — R7989 Other specified abnormal findings of blood chemistry: Secondary | ICD-10-CM | POA: Diagnosis present

## 2022-05-15 DIAGNOSIS — R778 Other specified abnormalities of plasma proteins: Secondary | ICD-10-CM | POA: Diagnosis not present

## 2022-05-15 DIAGNOSIS — N1831 Chronic kidney disease, stage 3a: Secondary | ICD-10-CM | POA: Diagnosis present

## 2022-05-15 DIAGNOSIS — Z955 Presence of coronary angioplasty implant and graft: Secondary | ICD-10-CM | POA: Diagnosis not present

## 2022-05-15 DIAGNOSIS — R0902 Hypoxemia: Secondary | ICD-10-CM | POA: Diagnosis present

## 2022-05-15 DIAGNOSIS — I130 Hypertensive heart and chronic kidney disease with heart failure and stage 1 through stage 4 chronic kidney disease, or unspecified chronic kidney disease: Secondary | ICD-10-CM | POA: Diagnosis present

## 2022-05-15 DIAGNOSIS — F1090 Alcohol use, unspecified, uncomplicated: Secondary | ICD-10-CM | POA: Diagnosis not present

## 2022-05-15 DIAGNOSIS — E039 Hypothyroidism, unspecified: Secondary | ICD-10-CM | POA: Diagnosis present

## 2022-05-15 DIAGNOSIS — Z8673 Personal history of transient ischemic attack (TIA), and cerebral infarction without residual deficits: Secondary | ICD-10-CM

## 2022-05-15 DIAGNOSIS — R008 Other abnormalities of heart beat: Secondary | ICD-10-CM | POA: Diagnosis present

## 2022-05-15 DIAGNOSIS — F4310 Post-traumatic stress disorder, unspecified: Secondary | ICD-10-CM | POA: Diagnosis present

## 2022-05-15 DIAGNOSIS — J449 Chronic obstructive pulmonary disease, unspecified: Secondary | ICD-10-CM | POA: Diagnosis present

## 2022-05-15 DIAGNOSIS — Z825 Family history of asthma and other chronic lower respiratory diseases: Secondary | ICD-10-CM

## 2022-05-15 DIAGNOSIS — E875 Hyperkalemia: Secondary | ICD-10-CM | POA: Diagnosis present

## 2022-05-15 DIAGNOSIS — I10 Essential (primary) hypertension: Secondary | ICD-10-CM | POA: Diagnosis not present

## 2022-05-15 DIAGNOSIS — I493 Ventricular premature depolarization: Secondary | ICD-10-CM | POA: Diagnosis present

## 2022-05-15 DIAGNOSIS — I5043 Acute on chronic combined systolic (congestive) and diastolic (congestive) heart failure: Secondary | ICD-10-CM | POA: Diagnosis present

## 2022-05-15 DIAGNOSIS — R0789 Other chest pain: Secondary | ICD-10-CM | POA: Diagnosis present

## 2022-05-15 DIAGNOSIS — J441 Chronic obstructive pulmonary disease with (acute) exacerbation: Secondary | ICD-10-CM | POA: Diagnosis not present

## 2022-05-15 DIAGNOSIS — Z7902 Long term (current) use of antithrombotics/antiplatelets: Secondary | ICD-10-CM

## 2022-05-15 DIAGNOSIS — R7303 Prediabetes: Secondary | ICD-10-CM | POA: Diagnosis not present

## 2022-05-15 DIAGNOSIS — E782 Mixed hyperlipidemia: Secondary | ICD-10-CM | POA: Diagnosis not present

## 2022-05-15 DIAGNOSIS — F419 Anxiety disorder, unspecified: Secondary | ICD-10-CM | POA: Diagnosis present

## 2022-05-15 DIAGNOSIS — E538 Deficiency of other specified B group vitamins: Secondary | ICD-10-CM | POA: Diagnosis present

## 2022-05-15 DIAGNOSIS — N179 Acute kidney failure, unspecified: Secondary | ICD-10-CM | POA: Diagnosis not present

## 2022-05-15 DIAGNOSIS — Z886 Allergy status to analgesic agent status: Secondary | ICD-10-CM

## 2022-05-15 HISTORY — DX: Malignant neoplasm of prostate: C61

## 2022-05-15 HISTORY — DX: Non-ST elevation (NSTEMI) myocardial infarction: I21.4

## 2022-05-15 MED ORDER — VITAMIN D 25 MCG (1000 UNIT) PO TABS
2000.0000 [IU] | ORAL_TABLET | Freq: Every day | ORAL | Status: DC
  Administered 2022-05-16 – 2022-05-18 (×3): 2000 [IU] via ORAL
  Filled 2022-05-15 (×3): qty 2

## 2022-05-15 MED ORDER — AMLODIPINE BESYLATE 5 MG PO TABS
5.0000 mg | ORAL_TABLET | Freq: Every day | ORAL | Status: DC
  Administered 2022-05-16: 5 mg via ORAL
  Filled 2022-05-15: qty 1

## 2022-05-15 MED ORDER — CITALOPRAM HYDROBROMIDE 20 MG PO TABS
20.0000 mg | ORAL_TABLET | Freq: Every day | ORAL | Status: DC
Start: 2022-05-16 — End: 2022-05-15

## 2022-05-15 MED ORDER — PROPRANOLOL HCL ER 120 MG PO CP24: 120.0000 mg | ORAL_CAPSULE | Freq: Every day | ORAL | Status: DC

## 2022-05-15 MED ORDER — SIMVASTATIN 20 MG PO TABS
20.0000 mg | ORAL_TABLET | Freq: Every evening | ORAL | Status: DC
Start: 2022-05-16 — End: 2022-05-15

## 2022-05-15 MED ORDER — CITALOPRAM HYDROBROMIDE 20 MG PO TABS
20.0000 mg | ORAL_TABLET | Freq: Every day | ORAL | Status: DC
  Administered 2022-05-16 – 2022-05-18 (×3): 20 mg via ORAL
  Filled 2022-05-15 (×3): qty 1

## 2022-05-15 MED ORDER — LEVOTHYROXINE SODIUM 50 MCG PO TABS
50.0000 ug | ORAL_TABLET | Freq: Every day | ORAL | Status: DC
  Administered 2022-05-17 – 2022-05-18 (×2): 50 ug via ORAL
  Filled 2022-05-15 (×3): qty 1

## 2022-05-15 MED ORDER — ISOSORBIDE MONONITRATE ER 60 MG PO TB24
60.0000 mg | ORAL_TABLET | Freq: Every day | ORAL | Status: DC
  Administered 2022-05-16 – 2022-05-18 (×3): 60 mg via ORAL
  Filled 2022-05-15 (×3): qty 1

## 2022-05-15 MED ORDER — INFLUENZA VAC A&B SA ADJ QUAD 0.5 ML IM PRSY
0.5000 mL | PREFILLED_SYRINGE | INTRAMUSCULAR | Status: AC
  Administered 2022-05-18: 0.5 mL via INTRAMUSCULAR
  Filled 2022-05-15: qty 0.5

## 2022-05-15 MED ORDER — ASPIRIN 81 MG PO TBEC
81.0000 mg | DELAYED_RELEASE_TABLET | Freq: Every day | ORAL | Status: DC
  Administered 2022-05-16 – 2022-05-18 (×3): 81 mg via ORAL
  Filled 2022-05-15 (×3): qty 1

## 2022-05-15 MED ORDER — ONDANSETRON HCL 4 MG/2ML IJ SOLN: 4.0000 mg | Freq: Four times a day (QID) | INTRAMUSCULAR | Status: DC | PRN

## 2022-05-15 MED ORDER — TAMSULOSIN HCL 0.4 MG PO CAPS
0.4000 mg | ORAL_CAPSULE | Freq: Every evening | ORAL | Status: DC
  Administered 2022-05-16 – 2022-05-18 (×3): 0.4 mg via ORAL
  Filled 2022-05-15 (×3): qty 1

## 2022-05-15 MED ORDER — CLOPIDOGREL BISULFATE 75 MG PO TABS
75.0000 mg | ORAL_TABLET | Freq: Every day | ORAL | Status: DC
  Administered 2022-05-16 – 2022-05-18 (×3): 75 mg via ORAL
  Filled 2022-05-15 (×3): qty 1

## 2022-05-15 MED ORDER — QUETIAPINE FUMARATE 25 MG PO TABS
25.0000 mg | ORAL_TABLET | Freq: Every day | ORAL | Status: DC
  Administered 2022-05-16 – 2022-05-17 (×2): 25 mg via ORAL
  Filled 2022-05-15 (×2): qty 1

## 2022-05-15 MED ORDER — PROPRANOLOL HCL ER 60 MG PO CP24
60.0000 mg | ORAL_CAPSULE | Freq: Every day | ORAL | Status: DC
  Administered 2022-05-16 – 2022-05-18 (×3): 60 mg via ORAL
  Filled 2022-05-15 (×3): qty 1

## 2022-05-15 MED ORDER — NITROGLYCERIN 0.4 MG SL SUBL: 0.4000 mg | SUBLINGUAL_TABLET | SUBLINGUAL | Status: DC | PRN

## 2022-05-15 MED ORDER — ACETAMINOPHEN 325 MG PO TABS: 650.0000 mg | ORAL_TABLET | ORAL | Status: DC | PRN

## 2022-05-15 MED ORDER — ATORVASTATIN CALCIUM 10 MG PO TABS: 10.0000 mg | ORAL_TABLET | Freq: Every day | ORAL | Status: DC

## 2022-05-15 MED ORDER — HEPARIN (PORCINE) 25000 UT/250ML-% IV SOLN
900.0000 [IU]/h | INTRAVENOUS | Status: DC
  Administered 2022-05-15: 900 [IU]/h via INTRAVENOUS
  Filled 2022-05-15: qty 250

## 2022-05-15 MED ORDER — GABAPENTIN 300 MG PO CAPS
300.0000 mg | ORAL_CAPSULE | Freq: Three times a day (TID) | ORAL | Status: DC
  Administered 2022-05-16 – 2022-05-18 (×7): 300 mg via ORAL
  Filled 2022-05-15 (×8): qty 1

## 2022-05-15 MED ORDER — ADULT MULTIVITAMIN W/MINERALS CH
1.0000 | ORAL_TABLET | Freq: Every day | ORAL | Status: DC
  Administered 2022-05-16 – 2022-05-18 (×3): 1 via ORAL
  Filled 2022-05-15 (×3): qty 1

## 2022-05-15 MED ORDER — TRAZODONE HCL 100 MG PO TABS
100.0000 mg | ORAL_TABLET | Freq: Every day | ORAL | Status: DC
  Administered 2022-05-16 – 2022-05-17 (×2): 100 mg via ORAL
  Filled 2022-05-15 (×2): qty 1

## 2022-05-15 MED ORDER — ALLOPURINOL 300 MG PO TABS
150.0000 mg | ORAL_TABLET | Freq: Every day | ORAL | Status: DC
Start: 2022-05-16 — End: 2022-05-15

## 2022-05-15 MED ORDER — FOLIC ACID 1 MG PO TABS
1.0000 mg | ORAL_TABLET | Freq: Every day | ORAL | Status: DC
  Administered 2022-05-16 – 2022-05-18 (×3): 1 mg via ORAL
  Filled 2022-05-15 (×3): qty 1

## 2022-05-15 MED ORDER — ALLOPURINOL 300 MG PO TABS
300.0000 mg | ORAL_TABLET | Freq: Every day | ORAL | Status: DC
  Administered 2022-05-16 – 2022-05-18 (×3): 300 mg via ORAL
  Filled 2022-05-15 (×3): qty 1

## 2022-05-15 MED ORDER — PNEUMOCOCCAL 20-VAL CONJ VACC 0.5 ML IM SUSY
0.5000 mL | PREFILLED_SYRINGE | INTRAMUSCULAR | Status: DC
  Filled 2022-05-15 (×2): qty 0.5

## 2022-05-15 NOTE — Progress Notes (Signed)
Pt arrived on unit via stretcher w/ EMS. Cards paged for admission. Attempted to call spouse Gustavus Bryant at [phone], call unable to be completed.

## 2022-05-15 NOTE — Progress Notes (Signed)
ANTICOAGULATION CONSULT NOTE - Initial Consult  Pharmacy Consult:  Heparin Indication: chest pain/ACS  Allergies  Allergen Reactions   Aspirin Other (See Comments)    GI upset   Hydrocodone Itching    Patient Measurements: Height: '5\' 9"'$  (175.3 cm) Weight: 88.6 kg (195 lb 5.2 oz) IBW/kg (Calculated) : 70.7 Heparin Dosing Weight: 88 kg  Vital Signs: Temp: 97 F (36.1 C) (09/24 2226) Temp Source: Axillary (09/24 2226) BP: 112/65 (09/24 2257) Pulse Rate: 61 (09/24 2257)  Labs: No results for input(s): "HGB", "HCT", "PLT", "APTT", "LABPROT", "INR", "HEPARINUNFRC", "HEPRLOWMOCWT", "CREATININE", "CKTOTAL", "CKMB", "TROPONINIHS" in the last 72 hours.  CrCl cannot be calculated (Patient's most recent lab result is older than the maximum 21 days allowed.).   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  Most recently, patient's heparin level was elevated, so heparin was held and then restarted at 900 units/hr around 1700 prior to transfer to [facility].  Hemoccult negative x2 per pharmacist at [facility].  Patient has a history of GIB/diverticular hemorrhage.  CBC stable at [place].  Goal of Therapy:  Heparin level 0.3-0.7 units/ml Monitor platelets by anticoagulation  protocol: Yes   Plan:  Continue heparin infusion at 900 units/hr  Check heparin level  Kendale Rembold D. Mina Marble, PharmD, BCPS, [place] 05/15/2022, 11:33 PM

## 2022-05-15 NOTE — H&P (Signed)
Cardiology Admission History and Physical   Patient ID: Andre Ross MRN: [id number]; DOB: 1946-07-24   Admission date: 05/15/2022  PCP:  Nicoletta Dress, MD   [place] Providers Cardiologist:  None        Chief Complaint:  Chest pain  Patient Profile:   Andre Ross is a 76 y.o. male with a PMHx of HTN, CVA, CHF, CKD, HLD, COPD, Gout, OA, PUD, hypothyroidism, diverticulosis, anxiety/depression who was transferred from [facility] on 9/24.  History of Present Illness:   Andre Ross is a 76 y.o. male with a PMHx of HTN, CVA, CHF, CKD, HLD, COPD, Gout, OA, PUD, hypothyroidism, diverticulosis, anxiety/depression who was transferred from [facility] on 9/24.   Patient initially presented to [facility] on 9/19 with dyspnea, chest pain, orthopnea and worsening lower extremity edema.  Labs notable for hyperkalemia, elevated BNP, elevated troponin on admission.  ECG stable.  Heparin and nitro infusions were started.  Patient was also hypoxic requiring BiPAP with concerns for COPD exacerbation.  Heparin drip was continued for 48 hours and DC'd on 9/21.  Lexiscan stress test was ordered, but prior to administration of Lexiscan, ECG displayed T wave inversion suggesting significant LAD disease.  Plan was to restart heparin infusion, but due to loss of IV access, patient was started on Lovenox 1 mg/kg SQ. transferred to [facility] was made for coronary angiography.    Labs on day of transfer outlined below. CBC: WBC 17.2, hemoglobin 11.5, hematocrit 34.5, platelet 197 BMP: Sodium 135, potassium 3.9, chloride 105, bicarb 27, BUN 47, creatinine 1.3, glucose 97, calcium 9.0  Upon evaluation this time, patient admits to continued chest discomfort.  He is unsure why he has been transferred to this hospital.  He admits to improvement in lower extremity edema and shortness of breath.  He is able to lie flat without any orthopnea.  There are no other concerns this  time.   Past Medical History:  Diagnosis Date   Acute gastrointestinal bleeding 03/28/2018   Acute on chronic combined systolic and diastolic CHF (congestive heart failure) ([place]) 03/28/2018   Alcohol abuse 03/28/2018   CKD (chronic kidney disease) stage 3, GFR 30-59 ml/min (HCC) 03/28/2018   Diverticular hemorrhage 03/28/2018   Hyperlipidemia 03/28/2018   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 and stage 3 chronic kidney disease ([place]) 03/28/2018   Left ventricular systolic dysfunction 12/23/84   Segmental LAD distribution EF 45-50%   PTSD (post-traumatic stress disorder) 03/28/2018   TIA (transient ischemic attack) 03/28/2018    Past Surgical History:  Procedure Laterality Date   APPENDECTOMY     INGUINAL HERNIA REPAIR     STOMACH SURGERY     TONSILLECTOMY       Medications Prior to Admission: Prior to Admission medications   Medication Sig Start Date End Date Taking? Authorizing Provider  acetaminophen (TYLENOL) 500 MG tablet Take 1 tablet by mouth 4 (four) times daily as needed for moderate pain or headache. 03/01/18   [provider]  allopurinol (ZYLOPRIM) 300 MG tablet Take 150 mg by mouth daily. 03/01/18   [provider]  amLODipine (NORVASC) 5 MG tablet Take 1 tablet (5 mg total) by mouth daily. 07/25/21 08/24/21  Antonieta Pert, MD  azithromycin (ZITHROMAX) 500 MG tablet Take 1 tablet (500 mg total) by mouth daily. 07/25/21   Antonieta Pert, MD  Carboxymethylcellulose Sod PF 0.25 % SOLN Apply 15 mLs to eye 4 (four) times daily as  needed (dry eyes).    [provider]  Cholecalciferol (VITAMIN D) 2000 units tablet Take 2,000 Units by mouth daily. 03/01/18   [provider]  citalopram (CELEXA) 40 MG tablet Take 20 mg by mouth daily. 04/04/18   [provider]  clopidogrel (PLAVIX) 75 MG tablet Take 75 mg by mouth daily. 03/01/18   [provider]  folic acid (FOLVITE) 1 MG tablet Take 1 mg by mouth daily.     [provider]  furosemide (LASIX) 40 MG tablet Take 1 tablet (40 mg total) by mouth daily. 04/24/18   Richardo Priest, MD  gabapentin (NEURONTIN) 100 MG capsule Take 300 mg by mouth 3 (three) times daily. 04/04/18   [provider]  isosorbide mononitrate (IMDUR) 60 MG 24 hr tablet Take 60 mg by mouth daily.    [provider]  loratadine (CLARITIN) 10 MG tablet Take 10 mg by mouth daily as needed for allergies.    [provider]  Multiple Vitamin (MULTIVITAMIN) tablet Take 1 tablet by mouth daily.    [provider]  propranolol ER (INDERAL LA) 120 MG 24 hr capsule Take 120 mg by mouth daily. 03/05/18   [provider]  simvastatin (ZOCOR) 20 MG tablet Take 20 mg by mouth every evening. 03/01/18   [provider]  tamsulosin (FLOMAX) 0.4 MG CAPS capsule Take 0.4 mg by mouth every evening. 03/01/18   [provider]  Tiotropium Bromide Monohydrate (SPIRIVA RESPIMAT) 1.25 MCG/ACT AERS Inhale 2 Pump into the lungs daily. 12/11/20 12/12/21  [provider]  tobramycin-dexamethasone [facility]) ophthalmic ointment Place 1 application into the right eye at bedtime as needed (irritation).    [provider]  vitamin B-12 (CYANOCOBALAMIN) 500 MCG tablet Take 1,000 mcg by mouth daily.    [provider]     Allergies:    Allergies  Allergen Reactions   Aspirin Other (See Comments)    GI upset   Hydrocodone Itching    Social History:   Social History   Socioeconomic History   Marital status: Married    Spouse name: Not on file   Number of children: Not on file   Years of education: Not on file   Highest education level: Not on file  Occupational History   Not on file  Tobacco Use   Smoking status: Former    Packs/day: 1.00    Years: 30.00    Total pack years: 30.00    Types: Cigarettes    Quit date: 83    Years since quitting: 35.7   Smokeless tobacco: Never  Vaping Use   Vaping Use: Never  used  Substance and Sexual Activity   Alcohol use: Yes    Comment: occasionally   Drug use: Not Currently   Sexual activity: Not on file  Other Topics Concern   Not on file  Social History Narrative   Not on file   Social Determinants of Health   Financial Resource Strain: Not on file  Food Insecurity: No Food Insecurity (05/15/2022)   Hunger Vital Sign    Worried About Running Out of Food in the Last Year: Never true    Ran Out of Food in the Last Year: Never true  Transportation Needs: No Transportation Needs (05/15/2022)   PRAPARE - Hydrologist (Medical): No    Lack of Transportation (Non-Medical): No  Physical Activity: Not on file  Stress: Not on file  Social Connections: Not on file  Intimate Partner Violence: Not At Risk (05/15/2022)   Humiliation, Afraid, Rape, and Kick questionnaire    Fear of Current or Ex-Partner: No    Emotionally Abused: No    Physically Abused: No    Sexually Abused: No    Family History:   The patient's family history includes COPD in his father; Heart attack in his father; Heart disease in his father; Hypertension in his maternal grandmother and paternal grandmother.    ROS:  Please see the history of present illness.  14 point ROS performed. All other ROS reviewed and negative.     Physical Exam/Data:   Vitals:   05/15/22 2226  BP: (!) 89/79  Pulse: 61  Resp: 18  Temp: (!) 97 F (36.1 C)  TempSrc: Axillary  SpO2: 95%  Weight: 88.6 kg  Height: '5\' 9"'$  (1.753 m)   No intake or output data in the 24 hours ending 05/15/22 2259    05/15/2022   10:26 PM 07/25/2021    4:00 AM 07/24/2021    4:00 AM  Last 3 Weights  Weight (lbs) 195 lb 5.2 oz 186 lb 4.6 oz 191 lb 9.3 oz  Weight (kg) 88.6 kg 84.5 kg 86.9 kg     Body mass index is 28.84 kg/m.  General:  Well nourished, well developed, in no acute distress HEENT: normal Neck: no JVD Vascular: No carotid bruits; Distal pulses 2+ bilaterally   Cardiac:   normal S1, S2; RRR; no murmur  Lungs:  clear to auscultation bilaterally, no wheezing, rhonchi or rales  Abd: soft, nontender, no hepatomegaly  Ext: no edema Musculoskeletal:  No deformities, BUE and BLE strength normal and equal Skin: warm and dry  Neuro:  CNs 2-12 intact, no focal abnormalities noted Psych:  Normal affect    EKG:  ECG at OSH with TWI in LAD distribution; repeat ECG here pending  Relevant CV Studies: ECHO 07/22/21: Left ventricular ejection fraction, by estimation, is 70 to 75%. The left ventricle has hyperdynamic function. The left ventricle has no regional wall motion abnormalities. Left ventricular diastolic parameters are consistent with Grade I diastolic dysfunction (impaired relaxation). 2. Right ventricular systolic function is normal. The right ventricular size is normal. The mitral valve is normal in structure. No evidence of mitral valve regurgitation. No evidence of mitral stenosis. 3. The aortic valve has an indeterminant number of cusps. Aortic valve regurgitation is not visualized. No aortic stenosis is present. 4. The inferior vena cava is normal in size with greater than 50% respiratory variability, suggesting right atrial pressure of 3 mmHg.  SPECT 12/14/17:   Laboratory Data:  High Sensitivity Troponin:  No results for input(s): "TROPONINIHS" in the last 720 hours.    ChemistryNo results for input(s): "NA", "K", "CL", "CO2", "GLUCOSE", "BUN", "CREATININE", "CALCIUM", "MG", "GFRNONAA", "GFRAA", "ANIONGAP" in the last 168 hours.  No results for input(s): "PROT", "ALBUMIN", "AST", "ALT", "ALKPHOS", "BILITOT" in the last 168 hours. Lipids No results for input(s): "CHOL", "TRIG", "HDL", "LABVLDL", "LDLCALC", "CHOLHDL" in the last 168 hours. HematologyNo results for input(s): "WBC", "RBC", "HGB", "HCT", "MCV", "MCH", "MCHC", "RDW", "PLT" in the last 168 hours. Thyroid No results for input(s): "TSH", "FREET4" in the last 168 hours. BNPNo results for  input(s): "BNP", "PROBNP" in the last 168 hours.  DDimer No results for input(s): "DDIMER" in the last 168 hours.   Radiology/Studies:  No results found.   Assessment and Plan:   NSTEMI - Troponin elevated slightly at OSH, but not high sensitivity  - will repeat ECG  and troponin at this time - ECG prior to planned Lexiscan notable for displayed TWI in LAD distribution and thus SPECT not performed - plan for LHC in am  - continue heparin gtt and home DAPT with ASA '81mg'$  po daily + Plavix '75mg'$  po daily  - NPO at midnight   HTN HFpEF BP 112/65   Pulse 61   Temp (!) 97 F (36.1 C) (Axillary)   Resp 18   Ht '5\' 9"'$  (1.753 m)   Wt 88.6 kg   SpO2 96%   BMI 28.84 kg/m  - most recent ECHO with EF 70-75% and G1 DD - continue home Amlodipine '5mg'$  po daily  - continue home Imdur '60mg'$  po daily  - continue home Propranolol '60mg'$  po daily  - pt euvolemic on exam - will hold home Torsemide '20mg'$  po daily  - continue to monitor and titrate meds as needed  HLD - LDL - none for review - will order  - continue Atorvastatin '10mg'$  po daily   BPH - monitor UOP closely  - continue home Tamsulosin 0.'4mg'$  po daily   Gout - no uric acid level for review - continue home Allopurinol '300mg'$  po daily   VDD Vit B12 deficiency - no B12 or D level for review - continue home B12 '1000mg'$  po daily  - continue home Cholecalciferol '2000mg'$  po daily   MDD/GAD/Insomnia - continue home Seroquel '25mg'$  nightly  - continue home Celexa '40mg'$  po daily  - continue home Trazodone '100mg'$  po daily   Hypothyroidism - repeat TSH and free T3/T4 - continue home Levothyroxine 1mg po daily    Risk Assessment/Risk Scores:    TIMI Risk Score for Unstable Angina or Non-ST Elevation MI:   The patient's TIMI risk score is 6, which indicates a 41% risk of all cause mortality, new or recurrent myocardial infarction or need for urgent revascularization in the next 14 days.  New York Heart Association (NYHA) Functional  Class NYHA Class II     Severity of Illness: The appropriate patient status for this patient is INPATIENT. Inpatient status is judged to be reasonable and necessary in order to provide the required intensity of service to ensure the patient's safety. The patient's presenting symptoms, physical exam findings, and initial radiographic and laboratory data in the context of their chronic comorbidities is felt to place them at high risk for further clinical deterioration. Furthermore, it is not anticipated that the patient will be medically stable for discharge from the hospital within 2 midnights of admission.   * I certify that at the point of admission it is my clinical judgment that the patient will require inpatient hospital care spanning beyond 2 midnights from the point of admission due to high intensity of service, high risk for further deterioration and high frequency of surveillance required.*   For questions or updates, please contact CRichlandPlease consult [email or web address] for contact info under     Signed, JTemple Pacini MD  05/15/2022 10:59 PM

## 2022-05-16 ENCOUNTER — Encounter (HOSPITAL_COMMUNITY): Payer: Self-pay | Admitting: Internal Medicine

## 2022-05-16 ENCOUNTER — Encounter (HOSPITAL_COMMUNITY)
Admission: AD | Disposition: A | Discharge status: Skilled Nursing Facility | Payer: Self-pay | Source: Other Acute Inpatient Hospital | Attending: Cardiology

## 2022-05-16 DIAGNOSIS — E782 Mixed hyperlipidemia: Secondary | ICD-10-CM

## 2022-05-16 DIAGNOSIS — N1831 Chronic kidney disease, stage 3a: Secondary | ICD-10-CM | POA: Diagnosis not present

## 2022-05-16 DIAGNOSIS — I214 Non-ST elevation (NSTEMI) myocardial infarction: Secondary | ICD-10-CM | POA: Diagnosis not present

## 2022-05-16 DIAGNOSIS — R778 Other specified abnormalities of plasma proteins: Secondary | ICD-10-CM | POA: Diagnosis not present

## 2022-05-16 DIAGNOSIS — I5031 Acute diastolic (congestive) heart failure: Secondary | ICD-10-CM

## 2022-05-16 DIAGNOSIS — I130 Hypertensive heart and chronic kidney disease with heart failure and stage 1 through stage 4 chronic kidney disease, or unspecified chronic kidney disease: Secondary | ICD-10-CM

## 2022-05-16 DIAGNOSIS — I2510 Atherosclerotic heart disease of native coronary artery without angina pectoris: Secondary | ICD-10-CM | POA: Diagnosis not present

## 2022-05-16 HISTORY — PX: LEFT HEART CATH AND CORONARY ANGIOGRAPHY: CATH118249

## 2022-05-16 HISTORY — PX: CORONARY STENT INTERVENTION: CATH118234

## 2022-05-16 LAB — TROPONIN I (HIGH SENSITIVITY)
Troponin I (High Sensitivity): 144 ng/L (ref <18)
Troponin I (High Sensitivity): 150 ng/L (ref <18)
Troponin I (High Sensitivity): 153 ng/L (ref <18)

## 2022-05-16 LAB — CBC WITH DIFFERENTIAL/PLATELET
Abs Immature Granulocytes: 0.23 10*3/uL — ABNORMAL HIGH (ref 0.00–0.07)
Basophils Absolute: 0 10*3/uL (ref 0.0–0.1)
Basophils Relative: 0 %
Eosinophils Absolute: 0.7 10*3/uL — ABNORMAL HIGH (ref 0.0–0.5)
Eosinophils Relative: 4 %
HCT: 35.3 % — ABNORMAL LOW (ref 39.0–52.0)
Hemoglobin: 11.6 g/dL — ABNORMAL LOW (ref 13.0–17.0)
Immature Granulocytes: 1 %
Lymphocytes Relative: 17 %
Lymphs Abs: 2.7 10*3/uL (ref 0.7–4.0)
MCH: 30.8 pg (ref 26.0–34.0)
MCHC: 32.9 g/dL (ref 30.0–36.0)
MCV: 93.6 fL (ref 80.0–100.0)
Monocytes Absolute: 1.2 10*3/uL — ABNORMAL HIGH (ref 0.1–1.0)
Monocytes Relative: 7 %
Neutro Abs: 11.1 10*3/uL — ABNORMAL HIGH (ref 1.7–7.7)
Neutrophils Relative %: 71 %
Platelets: 200 10*3/uL (ref 150–400)
RBC: 3.77 MIL/uL — ABNORMAL LOW (ref 4.22–5.81)
RDW: 14.8 % (ref 11.5–15.5)
WBC: 16 10*3/uL — ABNORMAL HIGH (ref 4.0–10.5)
nRBC: 0 % (ref 0.0–0.2)

## 2022-05-16 LAB — BASIC METABOLIC PANEL
Anion gap: 11 (ref 5–15)
BUN: 45 mg/dL — ABNORMAL HIGH (ref 8–23)
CO2: 24 mmol/L (ref 22–32)
Calcium: 8.9 mg/dL (ref 8.9–10.3)
Chloride: 106 mmol/L (ref 98–111)
Creatinine, Ser: 1.56 mg/dL — ABNORMAL HIGH (ref 0.61–1.24)
GFR, Estimated: 45 mL/min — ABNORMAL LOW (ref >60)
Glucose, Bld: 100 mg/dL — ABNORMAL HIGH (ref 70–99)
Potassium: 3.8 mmol/L (ref 3.5–5.1)
Sodium: 141 mmol/L (ref 135–145)

## 2022-05-16 LAB — MAGNESIUM: Magnesium: 1.8 mg/dL (ref 1.7–2.4)

## 2022-05-16 LAB — SURGICAL PCR SCREEN
MRSA, PCR: POSITIVE — AB
Staphylococcus aureus: POSITIVE — AB

## 2022-05-16 LAB — LIPID PANEL
Cholesterol: 164 mg/dL (ref 0–200)
HDL: 52 mg/dL (ref >40)
LDL Cholesterol: 96 mg/dL (ref 0–99)
Total CHOL/HDL Ratio: 3.2 RATIO
Triglycerides: 78 mg/dL (ref <150)
VLDL: 16 mg/dL (ref 0–40)

## 2022-05-16 LAB — HEPARIN LEVEL (UNFRACTIONATED): Heparin Unfractionated: 0.42 IU/mL (ref 0.30–0.70)

## 2022-05-16 LAB — HEMOGLOBIN A1C
Hgb A1c MFr Bld: 5.9 % — ABNORMAL HIGH (ref 4.8–5.6)
Mean Plasma Glucose: 122.63 mg/dL

## 2022-05-16 SURGERY — LEFT HEART CATH AND CORONARY ANGIOGRAPHY
Anesthesia: LOCAL

## 2022-05-16 MED ORDER — SODIUM CHLORIDE 0.9 % IV SOLN
INTRAVENOUS | Status: AC | PRN
  Administered 2022-05-16: 250 mL via INTRAVENOUS

## 2022-05-16 MED ORDER — SODIUM CHLORIDE 0.9% FLUSH: 3.0000 mL | INTRAVENOUS | Status: DC | PRN

## 2022-05-16 MED ORDER — HYDRALAZINE HCL 20 MG/ML IJ SOLN
10.0000 mg | INTRAMUSCULAR | Status: AC | PRN
  Filled 2022-05-16: qty 1

## 2022-05-16 MED ORDER — CLOPIDOGREL BISULFATE 300 MG PO TABS
ORAL_TABLET | ORAL | Status: DC | PRN
  Administered 2022-05-16: 300 mg via ORAL

## 2022-05-16 MED ORDER — MUPIROCIN 2 % EX OINT
1.0000 | TOPICAL_OINTMENT | Freq: Two times a day (BID) | CUTANEOUS | Status: DC
  Administered 2022-05-16 – 2022-05-18 (×4): 1 via NASAL
  Filled 2022-05-16: qty 22

## 2022-05-16 MED ORDER — LIDOCAINE HCL (PF) 1 % IJ SOLN
INTRAMUSCULAR | Status: AC
  Filled 2022-05-16: qty 30

## 2022-05-16 MED ORDER — HEPARIN SODIUM (PORCINE) 1000 UNIT/ML IJ SOLN
INTRAMUSCULAR | Status: AC
  Filled 2022-05-16: qty 10

## 2022-05-16 MED ORDER — SODIUM CHLORIDE 0.9 % WEIGHT BASED INFUSION
3.0000 mL/kg/h | INTRAVENOUS | Status: DC
  Administered 2022-05-16: 3 mL/kg/h via INTRAVENOUS

## 2022-05-16 MED ORDER — ASPIRIN 81 MG PO CHEW: 81.0000 mg | CHEWABLE_TABLET | ORAL | Status: AC

## 2022-05-16 MED ORDER — CHLORHEXIDINE GLUCONATE CLOTH 2 % EX PADS
6.0000 | MEDICATED_PAD | Freq: Every day | CUTANEOUS | Status: DC
  Administered 2022-05-16 – 2022-05-18 (×3): 6 via TOPICAL

## 2022-05-16 MED ORDER — HEPARIN (PORCINE) IN NACL 1000-0.9 UT/500ML-% IV SOLN
INTRAVENOUS | Status: AC
  Filled 2022-05-16: qty 1000

## 2022-05-16 MED ORDER — HEPARIN SODIUM (PORCINE) 1000 UNIT/ML IJ SOLN
INTRAMUSCULAR | Status: DC | PRN
  Administered 2022-05-16 (×2): 5000 [IU] via INTRAVENOUS

## 2022-05-16 MED ORDER — IOHEXOL 350 MG/ML SOLN
INTRAVENOUS | Status: DC | PRN
  Administered 2022-05-16: 82 mL

## 2022-05-16 MED ORDER — MIDAZOLAM HCL 2 MG/2ML IJ SOLN
INTRAMUSCULAR | Status: DC | PRN
  Administered 2022-05-16: 1 mg via INTRAVENOUS

## 2022-05-16 MED ORDER — VERAPAMIL HCL 2.5 MG/ML IV SOLN
INTRAVENOUS | Status: AC
  Filled 2022-05-16: qty 2

## 2022-05-16 MED ORDER — SODIUM CHLORIDE 0.9 % WEIGHT BASED INFUSION
1.0000 mL/kg/h | INTRAVENOUS | Status: AC
  Administered 2022-05-16: 1 mL/kg/h via INTRAVENOUS

## 2022-05-16 MED ORDER — SODIUM CHLORIDE 0.9% FLUSH
3.0000 mL | Freq: Two times a day (BID) | INTRAVENOUS | Status: DC
  Administered 2022-05-16: 3 mL via INTRAVENOUS

## 2022-05-16 MED ORDER — SODIUM CHLORIDE 0.9 % IV SOLN: 250.0000 mL | INTRAVENOUS | Status: DC | PRN

## 2022-05-16 MED ORDER — MIDAZOLAM HCL 2 MG/2ML IJ SOLN
INTRAMUSCULAR | Status: AC
  Filled 2022-05-16: qty 2

## 2022-05-16 MED ORDER — LABETALOL HCL 5 MG/ML IV SOLN: 10.0000 mg | INTRAVENOUS | Status: AC | PRN

## 2022-05-16 MED ORDER — ATORVASTATIN CALCIUM 40 MG PO TABS
40.0000 mg | ORAL_TABLET | Freq: Every day | ORAL | Status: DC
  Administered 2022-05-16 – 2022-05-18 (×3): 40 mg via ORAL
  Filled 2022-05-16 (×3): qty 1

## 2022-05-16 MED ORDER — SODIUM CHLORIDE 0.9 % WEIGHT BASED INFUSION
1.0000 mL/kg/h | INTRAVENOUS | Status: DC
  Administered 2022-05-16: 1 mL/kg/h via INTRAVENOUS

## 2022-05-16 MED ORDER — LIDOCAINE HCL (PF) 1 % IJ SOLN
INTRAMUSCULAR | Status: DC | PRN
  Administered 2022-05-16: 2 mL

## 2022-05-16 MED ORDER — FENTANYL CITRATE (PF) 100 MCG/2ML IJ SOLN
INTRAMUSCULAR | Status: AC
  Filled 2022-05-16: qty 2

## 2022-05-16 MED ORDER — HEPARIN (PORCINE) IN NACL 1000-0.9 UT/500ML-% IV SOLN
INTRAVENOUS | Status: DC | PRN
  Administered 2022-05-16 (×3): 500 mL

## 2022-05-16 MED ORDER — FENTANYL CITRATE (PF) 100 MCG/2ML IJ SOLN
INTRAMUSCULAR | Status: DC | PRN
  Administered 2022-05-16: 25 ug via INTRAVENOUS

## 2022-05-16 MED ORDER — SODIUM CHLORIDE 0.9% FLUSH
3.0000 mL | Freq: Two times a day (BID) | INTRAVENOUS | Status: DC
  Administered 2022-05-17 – 2022-05-18 (×3): 3 mL via INTRAVENOUS

## 2022-05-16 MED ORDER — CLOPIDOGREL BISULFATE 300 MG PO TABS
ORAL_TABLET | ORAL | Status: AC
  Filled 2022-05-16: qty 1

## 2022-05-16 SURGICAL SUPPLY — 19 items
BALL SAPPHIRE NC24 3.75X12 (BALLOONS) ×1
BALLN SAPPHIRE 2.5X12 (BALLOONS) ×1
BALLOON SAPPHIRE 2.5X12 (BALLOONS) IMPLANT
BALLOON SAPPHIRE NC24 3.75X12 (BALLOONS) IMPLANT
CATH 5FR JL3.5 JR4 ANG PIG MP (CATHETERS) IMPLANT
CATH LAUNCHER 6FR EBU3.5 (CATHETERS) IMPLANT
DEVICE RAD COMP TR BAND LRG (VASCULAR PRODUCTS) IMPLANT
GLIDESHEATH SLEND SS 6F .021 (SHEATH) IMPLANT
GUIDEWIRE INQWIRE 1.5J.035X260 (WIRE) IMPLANT
INQWIRE 1.5J .035X260CM (WIRE) ×1
KIT ENCORE 26 ADVANTAGE (KITS) IMPLANT
KIT HEART LEFT (KITS) ×1 IMPLANT
PACK CARDIAC CATHETERIZATION (CUSTOM PROCEDURE TRAY) ×1 IMPLANT
SET ATX SIMPLICITY (MISCELLANEOUS) IMPLANT
STENT SYNERGY XD 3.50X16 (Permanent Stent) IMPLANT
SYNERGY XD 3.50X16 (Permanent Stent) ×1 IMPLANT
TRANSDUCER W/STOPCOCK (MISCELLANEOUS) ×1 IMPLANT
TUBING CIL FLEX 10 FLL-RA (TUBING) ×1 IMPLANT
WIRE COUGAR XT STRL 190CM (WIRE) IMPLANT

## 2022-05-16 NOTE — Progress Notes (Signed)
   05/16/22 0458  Provider Notification  Provider Name/Title Jannifer Franklin MD  Date Provider Notified 05/16/22  Time Provider Notified [phone]  Method of Notification Page  Notification Reason Critical result  Test performed and critical result Troponin 144  Date Critical Result Received 05/16/22  Time Critical Result Received 0458  Provider response No new orders  Date of Provider Response 05/16/22  Time of Provider Response [phone]

## 2022-05-16 NOTE — Plan of Care (Signed)
Overnight admit from [facility] 1 week ago, many scattered dark purple bruises & scabbed abrasions Nares positive for MRSA and Staph  Problem: Education: Goal: Knowledge of General Education information will improve Description: Including pain rating scale, medication(s)/side effects and non-pharmacologic comfort measures Outcome: Progressing   Problem: Health Behavior/Discharge Planning: Goal: Ability to manage health-related needs will improve Outcome: Progressing   Problem: Clinical Measurements: Goal: Ability to maintain clinical measurements within normal limits will improve Outcome: Progressing Goal: Will remain free from infection Outcome: Progressing Goal: Diagnostic test results will improve Outcome: Progressing Goal: Respiratory complications will improve Outcome: Progressing Goal: Cardiovascular complication will be avoided Outcome: Progressing   Problem: Activity: Goal: Risk for activity intolerance will decrease Outcome: Progressing   Problem: Nutrition: Goal: Adequate nutrition will be maintained Outcome: Progressing   Problem: Coping: Goal: Level of anxiety will decrease Outcome: Progressing   Problem: Elimination: Goal: Will not experience complications related to bowel motility Outcome: Progressing Goal: Will not experience complications related to urinary retention Outcome: Progressing   Problem: Pain Managment: Goal: General experience of comfort will improve Outcome: Progressing   Problem: Safety: Goal: Ability to remain free from injury will improve Outcome: Progressing   Problem: Skin Integrity: Goal: Risk for impaired skin integrity will decrease Outcome: Progressing   Problem: Education: Goal: Understanding of cardiac disease, CV risk reduction, and recovery process will improve Outcome: Progressing Goal: Individualized Educational Video(s) Outcome: Progressing   Problem: Activity: Goal: Ability to tolerate increased activity  will improve Outcome: Progressing   Problem: Cardiac: Goal: Ability to achieve and maintain adequate cardiovascular perfusion will improve Outcome: Progressing   Problem: Health Behavior/Discharge Planning: Goal: Ability to safely manage health-related needs after discharge will improve Outcome: Progressing

## 2022-05-16 NOTE — Progress Notes (Signed)
ANTICOAGULATION CONSULT NOTE  Pharmacy Consult:  Heparin Indication: chest pain/ACS  Allergies  Allergen Reactions   Aspirin Other (See Comments)    GI upset   Hydrocodone Itching    Patient Measurements: Height: '5\' 9"'$  (175.3 cm) Weight: 88.6 kg (195 lb 5.2 oz) IBW/kg (Calculated) : 70.7 Heparin Dosing Weight: 88 kg  Vital Signs: Temp: 97 F (36.1 C) (09/24 2226) Temp Source: Axillary (09/24 2226) BP: 112/65 (09/24 2257) Pulse Rate: 61 (09/24 2257)  Labs: Recent Labs    05/16/22 0217  HGB 11.6*  HCT 35.3*  PLT 200  HEPARINUNFRC 0.42    CrCl cannot be calculated (Patient's most recent lab result is older than the maximum 21 days allowed.).   Medical History: Past Medical History:  Diagnosis Date   Acute gastrointestinal bleeding 03/28/2018   Acute on chronic combined systolic and diastolic CHF (congestive heart failure) ([place]) 03/28/2018   Alcohol abuse 03/28/2018   CKD (chronic kidney disease) stage 3, GFR 30-59 ml/min (HCC) 03/28/2018   Diverticular hemorrhage 03/28/2018   Hyperlipidemia 03/28/2018   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 and stage 3 chronic kidney disease ([place]) 03/28/2018   Left ventricular systolic dysfunction 04/24/2354   Segmental LAD distribution EF 45-50%   PTSD (post-traumatic stress disorder) 03/28/2018   TIA (transient ischemic attack) 03/28/2018    Assessment: 6 YOM presented to [facility] on 9/19 and has been on anticoagulation for NSTEMI.  Most recently, patient's heparin level was elevated, so heparin was held and then restarted at 900 units/hr around 1700 prior to transfer to [facility].  Hemoccult negative x2 per pharmacist at [facility].  Patient has a history of GIB/diverticular hemorrhage.    Heparin level therapeutic; no bleeding reported.  Goal of Therapy:  Heparin level 0.3-0.7 units/ml Monitor platelets by anticoagulation protocol: Yes   Plan:  Continue heparin infusion at 900 units/hr  Daily  heparin level and CBC  Ramonia Mcclaran D. Mina Marble, PharmD, BCPS, [place] 05/16/2022, 4:05 AM

## 2022-05-16 NOTE — Progress Notes (Addendum)
Dr. Marcelle Smiling paged about pt change with SBP start of shift 100's now 88/51 with a MAP of 63. Pt has no complications post LHC to right radial. A/O x4 with no complaints of pain and arouses easily. Pt has IVF infusing at 88.6 ml/hr post cath orders. Awaiting call back from physician.    Pt did have swelling and bruising noted to upper right arm reported by dayshift nurse Mickel Baas, RN. Pt had old IV to right forearm and received a lot of doses of heparin since admission. Pt still has no complaints at this time and arouses. Due to SBP being low H&H ordered on pt.

## 2022-05-16 NOTE — Interval H&P Note (Signed)
Cath Lab Visit (complete for each Cath Lab visit)  Clinical Evaluation Leading to the Procedure:   ACS: Yes.    Non-ACS:    Anginal Classification: CCS IV  Anti-ischemic medical therapy: Maximal Therapy (2 or more classes of medications)  Non-Invasive Test Results: No non-invasive testing performed  Prior CABG: No previous CABG      History and Physical Interval Note:  05/16/2022 5:00 PM  Candi Leash  has presented today for surgery, with the diagnosis of nstemi.  The various methods of treatment have been discussed with the patient and family. After consideration of risks, benefits and other options for treatment, the patient has consented to  Procedure(s): LEFT HEART CATH AND CORONARY ANGIOGRAPHY (N/A) as a surgical intervention.  The patient's history has been reviewed, patient examined, no change in status, stable for surgery.  I have reviewed the patient's chart and labs.  Questions were answered to the patient's satisfaction.     Sherren Mocha

## 2022-05-16 NOTE — Progress Notes (Addendum)
Rounding Note    Patient Name: Andre Ross Date of Encounter: 05/16/2022  [place] Cardiologist: None   Subjective   Had brief episode pf chest pain yesterday. Still intermittent; otherwise seems to be relatively comfortable  Inpatient Medications    Scheduled Meds:  allopurinol  300 mg Oral Daily   amLODipine  5 mg Oral Daily   aspirin EC  81 mg Oral Daily   atorvastatin  40 mg Oral Daily   Chlorhexidine Gluconate Cloth  6 each Topical Q0600   cholecalciferol  2,000 Units Oral Daily   citalopram  20 mg Oral Daily   clopidogrel  75 mg Oral Daily   folic acid  1 mg Oral Daily   gabapentin  300 mg Oral TID   influenza vaccine adjuvanted  0.5 mL Intramuscular Tomorrow-1000   isosorbide mononitrate  60 mg Oral Daily   levothyroxine  50 mcg Oral QAC breakfast   multivitamin with minerals  1 tablet Oral Daily   mupirocin ointment  1 Application Nasal BID   pneumococcal 20-valent conjugate vaccine  0.5 mL Intramuscular Tomorrow-1000   propranolol ER  60 mg Oral Daily   QUEtiapine  25 mg Oral QHS   sodium chloride flush  3 mL Intravenous Q12H   tamsulosin  0.4 mg Oral QPM   traZODone  100 mg Oral QHS   Continuous Infusions:  sodium chloride     sodium chloride 1 mL/kg/hr (05/16/22 0956)   heparin 900 Units/hr (05/15/22 2339)   PRN Meds: sodium chloride, acetaminophen, nitroGLYCERIN, ondansetron (ZOFRAN) IV, sodium chloride flush   Vital Signs    Vitals:   05/15/22 2257 05/16/22 0457 05/16/22 0458 05/16/22 0815  BP: 112/65 102/88 102/88 119/73  Pulse: 61 66 64 62  Resp:  20  20  Temp:  97.6 F (36.4 C)  97.8 F (36.6 C)  TempSrc:  Axillary  Oral  SpO2: 96% 90% 90% 97%  Weight:      Height:        Intake/Output Summary (Last 24 hours) at 05/16/2022 1132 Last data filed at 05/16/2022 0700 Gross per 24 hour  Intake 66.13 ml  Output 300 ml  Net -233.87 ml      05/15/2022   10:26 PM 07/25/2021    4:00 AM 07/24/2021    4:00 AM  Last 3 Weights   Weight (lbs) 195 lb 5.2 oz 186 lb 4.6 oz 191 lb 9.3 oz  Weight (kg) 88.6 kg 84.5 kg 86.9 kg      Telemetry    Sinus Rhythm - Personally Reviewed  ECG    No new tracing this morning  Physical Exam   GEN: No acute distress.   Neck: No JVD Cardiac: RRR, soft systolic murmur, no rubs, or gallops.  Respiratory: Clear to auscultation bilaterally.  Nonlabored, good air movement GI: Soft, nontender, non-distended  MS: No edema; No deformity. Neuro:  Nonfocal  Psych: Normal affect   Labs    High Sensitivity Troponin:   Recent Labs  Lab 05/16/22 0217 05/16/22 0722  TROPONINIHS 144* 150*     Chemistry Recent Labs  Lab 05/16/22 0217  NA 141  K 3.8  CL 106  CO2 24  GLUCOSE 100*  BUN 45*  CREATININE 1.56*  CALCIUM 8.9  MG 1.8  GFRNONAA 45*  ANIONGAP 11    Lipids  Recent Labs  Lab 05/16/22 0217  CHOL 164  TRIG 78  HDL 52  LDLCALC 96  CHOLHDL 3.2    Hematology Recent  Labs  Lab 05/16/22 0217  WBC 16.0*  RBC 3.77*  HGB 11.6*  HCT 35.3*  MCV 93.6  MCH 30.8  MCHC 32.9  RDW 14.8  PLT 200   Thyroid No results for input(s): "TSH", "FREET4" in the last 168 hours.  BNPNo results for input(s): "BNP", "PROBNP" in the last 168 hours.  DDimer No results for input(s): "DDIMER" in the last 168 hours.   Radiology    No results found.  Cardiac Studies   TTE from June 2023: Mild concentric LVH.  Normal LV function-EF 55 to 60%.  No RWMA.  Moderate LA dilation with diastolic dysfunction/impaired relaxation.  Mild RA enlargement.  Moderate PH -estimated PASP /RVSP 50 mmHg.  Patient Profile     76 y.o. male with a PMHx of HTN, CVA, CHF, CKD, HLD, COPD, Gout, OA, PUD, hypothyroidism, diverticulosis, anxiety/depression who was transferred from [facility] on 9/24.  Assessment & Plan    NSTEMI / ACS -- transferred from [facility] with chest pain. Initial plan for lexiscan, but developed EKG changes with TWI in LAD territory with recommendations to  transfer to [facility]. hsTn 144>>150 -- continue IV heparin, ASA, plavix, statin, Imdur -- planned for cardiac cath today  Shared Decision Making/Informed Consent The risks [stroke (1 in 1000), death (1 in 1000), kidney failure [usually temporary] (1 in 500), bleeding (1 in 200), allergic reaction [possibly serious] (1 in 200)], benefits (diagnostic support and management of coronary artery disease) and alternatives of a cardiac catheterization were discussed in detail with Andre Ross and he is willing to proceed.   HTN -- controlled -- continue norvasc '5mg'$  daily, propranolol '60mg'$  daily   HFpEF -- LVEF of 70-75% with G1DD on echo 07/2021 -60 to 65% by echo in June -- remains euvolemic on exam with torsemide held  HLD -- LDL 96, HDL 52 -- increase atorvastatin to '40mg'$  daily = Close reassessment to determine appropriate decrease in LDL levels.  Consider converting to rosuvastatin versus PCSK9 inhibitor, OR inclisiran  MDD/GAD/Insomnia -- continue home Seroquel '25mg'$  nightly, Celexa '40mg'$  po daily,  Trazodone '100mg'$  po daily   CKD stage III -- baseline Cr appears around 1.3-1.5  Pre DM -- Hgb A1c 5.9  Hx of CVA -- on ASA, plavix and statin  ETOH use -- reports 2 beers, 2 shots of vodka 4-5 days a week -- monitor   For questions or updates, please contact [place] Please consult [email or web address] for contact info under        Signed, Glenetta Hew, MD  05/16/2022, 11:32 AM     ATTENDING ATTESTATION  I have seen, examined and evaluated the patient this morning along with Andre Bellis, NP-C.  After reviewing all the available data and chart, we discussed the patients laboratory, study & physical findings as well as symptoms in detail.  I agree with our findings, examination as well as impression recommendations as per our discussion.    Attending adjustments noted in italics.   Transferred from [facility] with plans for cardiac catheterization.  Modest  troponin elevation with ongoing symptoms. Leonie Man, MD, MS Glenetta Hew, M.D., M.S. Interventional Cardiologist  [place]  Pager # [phone] Phone # [phone] [address] [place], [place] [address]

## 2022-05-16 NOTE — Care Management (Signed)
  Transition of Care [facility]) Screening Note   Patient Details  Name: Andre Ross Date of Birth: 1946/07/23   Transition of Care [facility]) CM/SW Contact:    Bethena Roys, RN Phone Number: 05/16/2022, 2:01 PM    Transition of Care Department [facility]) has reviewed the patient and no TOC needs have been identified at this time. Case Manager will continue to monitor patient advancement through interdisciplinary progression rounds. If new patient transition needs arise, please place a TOC consult.

## 2022-05-16 NOTE — H&P (View-Only) (Signed)
Rounding Note    Patient Name: Andre Ross Date of Encounter: 05/16/2022  [place] Cardiologist: None   Subjective   Had brief episode pf chest pain yesterday. Still intermittent; otherwise seems to be relatively comfortable  Inpatient Medications    Scheduled Meds:  allopurinol  300 mg Oral Daily   amLODipine  5 mg Oral Daily   aspirin EC  81 mg Oral Daily   atorvastatin  40 mg Oral Daily   Chlorhexidine Gluconate Cloth  6 each Topical Q0600   cholecalciferol  2,000 Units Oral Daily   citalopram  20 mg Oral Daily   clopidogrel  75 mg Oral Daily   folic acid  1 mg Oral Daily   gabapentin  300 mg Oral TID   influenza vaccine adjuvanted  0.5 mL Intramuscular Tomorrow-1000   isosorbide mononitrate  60 mg Oral Daily   levothyroxine  50 mcg Oral QAC breakfast   multivitamin with minerals  1 tablet Oral Daily   mupirocin ointment  1 Application Nasal BID   pneumococcal 20-valent conjugate vaccine  0.5 mL Intramuscular Tomorrow-1000   propranolol ER  60 mg Oral Daily   QUEtiapine  25 mg Oral QHS   sodium chloride flush  3 mL Intravenous Q12H   tamsulosin  0.4 mg Oral QPM   traZODone  100 mg Oral QHS   Continuous Infusions:  sodium chloride     sodium chloride 1 mL/kg/hr (05/16/22 0956)   heparin 900 Units/hr (05/15/22 2339)   PRN Meds: sodium chloride, acetaminophen, nitroGLYCERIN, ondansetron (ZOFRAN) IV, sodium chloride flush   Vital Signs    Vitals:   05/15/22 2257 05/16/22 0457 05/16/22 0458 05/16/22 0815  BP: 112/65 102/88 102/88 119/73  Pulse: 61 66 64 62  Resp:  20  20  Temp:  97.6 F (36.4 C)  97.8 F (36.6 C)  TempSrc:  Axillary  Oral  SpO2: 96% 90% 90% 97%  Weight:      Height:        Intake/Output Summary (Last 24 hours) at 05/16/2022 1132 Last data filed at 05/16/2022 0700 Gross per 24 hour  Intake 66.13 ml  Output 300 ml  Net -233.87 ml      05/15/2022   10:26 PM 07/25/2021    4:00 AM 07/24/2021    4:00 AM  Last 3 Weights   Weight (lbs) 195 lb 5.2 oz 186 lb 4.6 oz 191 lb 9.3 oz  Weight (kg) 88.6 kg 84.5 kg 86.9 kg      Telemetry    Sinus Rhythm - Personally Reviewed  ECG    No new tracing this morning  Physical Exam   GEN: No acute distress.   Neck: No JVD Cardiac: RRR, soft systolic murmur, no rubs, or gallops.  Respiratory: Clear to auscultation bilaterally.  Nonlabored, good air movement GI: Soft, nontender, non-distended  MS: No edema; No deformity. Neuro:  Nonfocal  Psych: Normal affect   Labs    High Sensitivity Troponin:   Recent Labs  Lab 05/16/22 0217 05/16/22 0722  TROPONINIHS 144* 150*     Chemistry Recent Labs  Lab 05/16/22 0217  NA 141  K 3.8  CL 106  CO2 24  GLUCOSE 100*  BUN 45*  CREATININE 1.56*  CALCIUM 8.9  MG 1.8  GFRNONAA 45*  ANIONGAP 11    Lipids  Recent Labs  Lab 05/16/22 0217  CHOL 164  TRIG 78  HDL 52  LDLCALC 96  CHOLHDL 3.2    Hematology Recent  Labs  Lab 05/16/22 0217  WBC 16.0*  RBC 3.77*  HGB 11.6*  HCT 35.3*  MCV 93.6  MCH 30.8  MCHC 32.9  RDW 14.8  PLT 200   Thyroid No results for input(s): "TSH", "FREET4" in the last 168 hours.  BNPNo results for input(s): "BNP", "PROBNP" in the last 168 hours.  DDimer No results for input(s): "DDIMER" in the last 168 hours.   Radiology    No results found.  Cardiac Studies   TTE from June 2023: Mild concentric LVH.  Normal LV function-EF 55 to 60%.  No RWMA.  Moderate LA dilation with diastolic dysfunction/impaired relaxation.  Mild RA enlargement.  Moderate PH -estimated PASP /RVSP 50 mmHg.  Patient Profile     76 y.o. male with a PMHx of HTN, CVA, CHF, CKD, HLD, COPD, Gout, OA, PUD, hypothyroidism, diverticulosis, anxiety/depression who was transferred from [facility] on 9/24.  Assessment & Plan    NSTEMI / ACS -- transferred from [facility] with chest pain. Initial plan for lexiscan, but developed EKG changes with TWI in LAD territory with recommendations to  transfer to [facility]. hsTn 144>>150 -- continue IV heparin, ASA, plavix, statin, Imdur -- planned for cardiac cath today  Shared Decision Making/Informed Consent The risks [stroke (1 in 1000), death (1 in 1000), kidney failure [usually temporary] (1 in 500), bleeding (1 in 200), allergic reaction [possibly serious] (1 in 200)], benefits (diagnostic support and management of coronary artery disease) and alternatives of a cardiac catheterization were discussed in detail with Andre Ross and he is willing to proceed.   HTN -- controlled -- continue norvasc '5mg'$  daily, propranolol '60mg'$  daily   HFpEF -- LVEF of 70-75% with G1DD on echo 07/2021 -60 to 65% by echo in June -- remains euvolemic on exam with torsemide held  HLD -- LDL 96, HDL 52 -- increase atorvastatin to '40mg'$  daily = Close reassessment to determine appropriate decrease in LDL levels.  Consider converting to rosuvastatin versus PCSK9 inhibitor, OR inclisiran  MDD/GAD/Insomnia -- continue home Seroquel '25mg'$  nightly, Celexa '40mg'$  po daily,  Trazodone '100mg'$  po daily   CKD stage III -- baseline Cr appears around 1.3-1.5  Pre DM -- Hgb A1c 5.9  Hx of CVA -- on ASA, plavix and statin  ETOH use -- reports 2 beers, 2 shots of vodka 4-5 days a week -- monitor   For questions or updates, please contact [place] Please consult [email or web address] for contact info under        Signed, Glenetta Hew, MD  05/16/2022, 11:32 AM     ATTENDING ATTESTATION  I have seen, examined and evaluated the patient this morning along with Reino Bellis, NP-C.  After reviewing all the available data and chart, we discussed the patients laboratory, study & physical findings as well as symptoms in detail.  I agree with our findings, examination as well as impression recommendations as per our discussion.    Attending adjustments noted in italics.   Transferred from [facility] with plans for cardiac catheterization.  Modest  troponin elevation with ongoing symptoms. Leonie Man, MD, MS Glenetta Hew, M.D., M.S. Interventional Cardiologist  [place]  Pager # [phone] Phone # [phone] [address] [place], [place] [address]

## 2022-05-17 ENCOUNTER — Encounter (HOSPITAL_COMMUNITY): Payer: Self-pay | Admitting: Cardiovascular Disease

## 2022-05-17 DIAGNOSIS — N1831 Chronic kidney disease, stage 3a: Secondary | ICD-10-CM | POA: Diagnosis not present

## 2022-05-17 DIAGNOSIS — E782 Mixed hyperlipidemia: Secondary | ICD-10-CM | POA: Diagnosis not present

## 2022-05-17 DIAGNOSIS — R778 Other specified abnormalities of plasma proteins: Secondary | ICD-10-CM | POA: Diagnosis not present

## 2022-05-17 DIAGNOSIS — I214 Non-ST elevation (NSTEMI) myocardial infarction: Secondary | ICD-10-CM | POA: Diagnosis not present

## 2022-05-17 LAB — CBC
HCT: 31.4 % — ABNORMAL LOW (ref 39.0–52.0)
Hemoglobin: 10.3 g/dL — ABNORMAL LOW (ref 13.0–17.0)
MCH: 31 pg (ref 26.0–34.0)
MCHC: 32.8 g/dL (ref 30.0–36.0)
MCV: 94.6 fL (ref 80.0–100.0)
Platelets: 179 10*3/uL (ref 150–400)
RBC: 3.32 MIL/uL — ABNORMAL LOW (ref 4.22–5.81)
RDW: 15 % (ref 11.5–15.5)
WBC: 11.3 10*3/uL — ABNORMAL HIGH (ref 4.0–10.5)
nRBC: 0 % (ref 0.0–0.2)

## 2022-05-17 LAB — BASIC METABOLIC PANEL
Anion gap: 7 (ref 5–15)
BUN: 33 mg/dL — ABNORMAL HIGH (ref 8–23)
CO2: 23 mmol/L (ref 22–32)
Calcium: 8.4 mg/dL — ABNORMAL LOW (ref 8.9–10.3)
Chloride: 111 mmol/L (ref 98–111)
Creatinine, Ser: 1.35 mg/dL — ABNORMAL HIGH (ref 0.61–1.24)
GFR, Estimated: 54 mL/min — ABNORMAL LOW (ref >60)
Glucose, Bld: 103 mg/dL — ABNORMAL HIGH (ref 70–99)
Potassium: 3.9 mmol/L (ref 3.5–5.1)
Sodium: 141 mmol/L (ref 135–145)

## 2022-05-17 LAB — OCCULT BLOOD X 1 CARD TO LAB, STOOL: Fecal Occult Bld: NEGATIVE

## 2022-05-17 LAB — POCT ACTIVATED CLOTTING TIME: Activated Clotting Time: 299 seconds

## 2022-05-17 MED ORDER — FERROUS SULFATE 325 (65 FE) MG PO TABS
325.0000 mg | ORAL_TABLET | Freq: Two times a day (BID) | ORAL | Status: DC
  Administered 2022-05-17 – 2022-05-18 (×3): 325 mg via ORAL
  Filled 2022-05-17 (×3): qty 1

## 2022-05-17 NOTE — Plan of Care (Signed)
  Problem: Clinical Measurements: Goal: Cardiovascular complication will be avoided Outcome: Progressing   Problem: Nutrition: Goal: Adequate nutrition will be maintained Outcome: Progressing   Problem: Coping: Goal: Level of anxiety will decrease Outcome: Progressing   Problem: Elimination: Goal: Will not experience complications related to urinary retention Outcome: Progressing   Problem: Pain Managment: Goal: General experience of comfort will improve Outcome: Progressing   Problem: Safety: Goal: Ability to remain free from injury will improve Outcome: Progressing   Problem: Cardiac: Goal: Ability to achieve and maintain adequate cardiovascular perfusion will improve Outcome: Progressing   Problem: Cardiovascular: Goal: Vascular access site(s) Level 0-1 will be maintained Outcome: Progressing

## 2022-05-17 NOTE — TOC Initial Note (Addendum)
Transition of Care [facility]) - Initial/Assessment Note    Patient Details  Name: Andre Ross MRN: [id number] Date of Birth: [birth year]  Transition [facility]) CM/SW Contact:    Milas Gain, [place] Phone Number: 05/17/2022, 1:10 PM  Clinical Narrative:                  Update- CSW received callback from April with [facility]. April informed CSW patient is 100% connected and has Medicare A and B. CSW spoke with patient at bedside. Patient informed CSW that he would like to use his medicare benefits for SNF placement. Patients number one choice is [facility]. CSW spoke with Olivia Mackie at [employer] and informed her that patient would like to use his Medicare A and B benefits for SNF placement. Olivia Mackie is going to review his referral. CSW awaiting determination.  CSW received consult for possible SNF placement at time of discharge. CSW spoke with patient at bedside regarding PT recommendation of SNF placement at time of discharge. Patient reports he comes from home with spouse. Patient expressed understanding of PT recommendation and is agreeable to SNF placement at time of discharge. Patient reports preference for [facility] . CSW discussed insurance authorization process. Patient reports he is with the [facility]. Patient reports he has not received the COVID vaccines. Patient expressed being hopeful for rehab and to feel better soon. No further questions reported at this time. CSW LVM for April with [facility] regarding SNF placement for patient. CSW awaiting callback.CSW to continue to follow and assist with discharge planning needs.   Expected Discharge Plan: Skilled Nursing Facility Barriers to Discharge: Continued Medical Work up   Patient Goals and CMS Choice Patient states their goals for this hospitalization and ongoing recovery are:: SNF CMS Medicare.gov Compare Post Acute Care list provided to:: Patient Choice offered to / list presented to : Patient  Expected Discharge  Plan and Services Expected Discharge Plan: [place] In-house Referral: Clinical Social Work     Living arrangements for the past 2 months: [place]                                      Prior Living Arrangements/Services Living arrangements for the past 2 months: Single Family Home Lives with:: Spouse Patient language and need for interpreter reviewed:: Yes Do you feel safe going back to the place where you live?: No   SNF  Need for Family Participation in Patient Care: Yes (Comment) Care giver support system in place?: Yes (comment)   Criminal Activity/Legal Involvement Pertinent to Current Situation/Hospitalization: No - Comment as needed  Activities of Daily Living Home Assistive Devices/Equipment: None ADL Screening (condition at time of admission) Patient's cognitive ability adequate to safely complete daily activities?: Yes Is the patient deaf or have difficulty hearing?: Yes Does the patient have difficulty seeing, even when wearing glasses/contacts?: No Does the patient have difficulty concentrating, remembering, or making decisions?: No Patient able to express need for assistance with ADLs?: Yes Does the patient have difficulty dressing or bathing?: No Independently performs ADLs?: Yes (appropriate for developmental age) Does the patient have difficulty walking or climbing stairs?: Yes Weakness of Legs: None Weakness of Arms/Hands: None  Permission Sought/Granted Permission sought to share information with : Case Manager, Family Supports, Chartered certified accountant granted to share information with : Yes, Verbal Permission Granted  Share Information with NAME: Inez Catalina  Permission granted to share info w AGENCY: SNF  Permission granted to share info w Relationship: spouse  Permission granted to share info w Contact Information: Inez Catalina [phone]  Emotional Assessment Appearance:: Appears stated  age Attitude/Demeanor/Rapport: Gracious Affect (typically observed): Calm Orientation: : Oriented to Self, Oriented to Place, Oriented to  Time, Oriented to Situation Alcohol / Substance Use: Not Applicable Psych Involvement: No (comment)  Admission diagnosis:  NSTEMI (non-ST elevated myocardial infarction) [facility]) [I21.4] Patient Active Problem List   Diagnosis Date Noted   NSTEMI (non-ST elevated myocardial infarction) ([place]) 05/15/2022   Acute respiratory failure ([place]) 07/21/2021   COPD with acute exacerbation ([place]) 07/21/2021   Hyperkalemia 07/21/2021   Elevated troponin 07/21/2021   Hypertensive urgency 07/21/2021   Sepsis due to pneumonia ([place]) 07/21/2021   Left ventricular systolic dysfunction 71/01/2693   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 [place] [address] Phone: [phone] Fax: [phone]     Social Determinants of Health (SDOH) Interventions    Readmission Risk Interventions     No data to display

## 2022-05-17 NOTE — Progress Notes (Signed)
CARDIAC REHAB PHASE I   PRE:  Rate/Rhythm: 43 SR with PVC  BP:  Sitting: 93/49      SaO2: 93 RA  MODE:  Ambulation: 50 ft   POST:  Rate/Rhythm: 76 SR with PVC   BP:  Sitting: 99/58      SaO2: 94 RA   Pt ambulated with assistance in hall using front wheel walker. Pt c/o of sob denies cp. Fatigued quickly and needed to sit in chair for return to room. Post stent /MI education including restrictions, heart healthy diet, site care, antiplatelet therapy importance, exercises guidelines, MI booklet,risk factors and CRP2 reviewed. All questions and concerns addressed. Will refer to  for CRP2. Will continue to follow.   [id number]   Vanessa Barbara, RN BSN 05/17/2022 9:20 AM

## 2022-05-17 NOTE — NC FL2 (Signed)
[place] LEVEL OF CARE SCREENING TOOL     IDENTIFICATION  Patient Name: Andre Ross Birthdate: [birth year] Sex: male Admission Date (Current Location): 05/15/2022  [facility] and [place] Number:  Herbalist and Address:  [facility] [place], [place] [address] [place], [place] [address]      Provider Number: [id number]  Attending Physician Name and Address:  Leonie Man, MD  Relative Name and Phone Number:       Current Level of Care: Hospital Recommended Level of Care: [place] Prior Approval Number:    Date Approved/Denied:   PASRR Number: [id number]  Discharge Plan: SNF    Current Diagnoses: Patient Active Problem List   Diagnosis Date Noted   NSTEMI (non-ST elevated myocardial infarction) ([place]) 05/15/2022   Acute respiratory failure ([place]) 07/21/2021   COPD with acute exacerbation ([place]) 07/21/2021   Hyperkalemia 07/21/2021   Elevated troponin 07/21/2021   Hypertensive urgency 07/21/2021   Sepsis due to pneumonia ([place]) 07/21/2021   Left ventricular systolic dysfunction 35/59/7416   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 and stage 3 chronic kidney disease ([place]) 03/28/2018   Hyperlipidemia 03/28/2018   Acute on chronic combined systolic and diastolic CHF (congestive heart failure) ([place]) 03/28/2018   Acute gastrointestinal bleeding 03/28/2018   Diverticular hemorrhage 03/28/2018   TIA (transient ischemic attack) 03/28/2018   CKD (chronic kidney disease) stage 3, GFR 30-59 ml/min (HCC) 03/28/2018   PTSD (post-traumatic stress disorder) 03/28/2018   Alcohol abuse 03/28/2018    Orientation RESPIRATION BLADDER Height & Weight     Self, Time, Situation, Place (WDL)  Normal Continent Weight: 195 lb 5.2 oz (88.6 kg) Height:  '5\' 9"'$  (175.3 cm)  BEHAVIORAL SYMPTOMS/MOOD NEUROLOGICAL BOWEL NUTRITION STATUS      Continent (WDL) Diet (Please see discharge summary)   AMBULATORY STATUS COMMUNICATION OF NEEDS Skin   Limited Assist Verbally Other (Comment) (Appropriate for ethnicity,Abrasion,Arm,ear,knee,Ecchymosis,arm,flank,leg,Erythema,butocks,sacrum,bilateral,Wound/Incision (LDAs))                       Personal Care Assistance Level of Assistance  Bathing, Feeding, Dressing Bathing Assistance: Limited assistance Feeding assistance: Independent Dressing Assistance: Limited assistance     Functional Limitations Info  Sight, Hearing, Speech Sight Info:  (WDL) Hearing Info: Impaired Speech Info: Adequate    SPECIAL CARE FACTORS FREQUENCY  PT (By licensed PT), OT (By licensed OT)     PT Frequency: 5x min weekly OT Frequency: 5x min weekly            Contractures Contractures Info: Not present    Additional Factors Info  Code Status, Allergies, Psychotropic Code Status Info: FULL Allergies Info: Aspirin,Hydrocodone Psychotropic Info: citalopram (CELEXA) tablet 20 mg daily,QUEtiapine (SEROQUEL) tablet 25 mg daily at bedtime,traZODone [facility]) tablet 100 mg daily at bedtime         Current Medications (05/17/2022):  This is the current hospital active medication list Current Facility-Administered Medications  Medication Dose Route Frequency Provider Last Rate Last Admin   0.9 %  sodium chloride infusion  250 mL Intravenous PRN Sherren Mocha, MD       acetaminophen (TYLENOL) tablet 650 mg  650 mg Oral Q4H PRN Sherren Mocha, MD       allopurinol (ZYLOPRIM) tablet 300 mg  300 mg Oral Daily Sherren Mocha, MD   300 mg at 05/17/22 1045   aspirin EC tablet 81 mg  81 mg Oral Daily Sherren Mocha, MD   [phone]  mg at 05/17/22 1045   atorvastatin (LIPITOR) tablet 40 mg  40 mg Oral Daily Sherren Mocha, MD   40 mg at 05/17/22 1045   Chlorhexidine Gluconate Cloth 2 % PADS 6 each  6 each Topical Q0600 Sherren Mocha, MD   6 each at 05/17/22 0546   cholecalciferol (VITAMIN D3) 25 MCG (1000 UNIT) tablet 2,000 Units  2,000 Units Oral Daily  Sherren Mocha, MD   2,000 Units at 05/17/22 1044   citalopram (CELEXA) tablet 20 mg  20 mg Oral Daily Sherren Mocha, MD   20 mg at 05/17/22 1045   clopidogrel (PLAVIX) tablet 75 mg  75 mg Oral Daily Sherren Mocha, MD   75 mg at 09/32/35 5732   folic acid (FOLVITE) tablet 1 mg  1 mg Oral Daily Sherren Mocha, MD   1 mg at 05/17/22 1045   gabapentin (NEURONTIN) capsule 300 mg  300 mg Oral TID Sherren Mocha, MD   300 mg at 05/17/22 1045   influenza vaccine adjuvanted (FLUAD) injection 0.5 mL  0.5 mL Intramuscular Tomorrow-1000 Donato Heinz, MD       isosorbide mononitrate (IMDUR) 24 hr tablet 60 mg  60 mg Oral Daily Sherren Mocha, MD   60 mg at 05/17/22 1045   levothyroxine (SYNTHROID) tablet 50 mcg  50 mcg Oral QAC breakfast Sherren Mocha, MD   50 mcg at 05/17/22 0544   multivitamin with minerals tablet 1 tablet  1 tablet Oral Daily Sherren Mocha, MD   1 tablet at 05/17/22 1044   mupirocin ointment (BACTROBAN) 2 % 1 Application  1 Application Nasal BID Sherren Mocha, MD   1 Application at 20/25/42 2205   nitroGLYCERIN (NITROSTAT) SL tablet 0.4 mg  0.4 mg Sublingual Q5 Min x 3 PRN Sherren Mocha, MD       ondansetron [facility]) injection 4 mg  4 mg Intravenous Q6H PRN Sherren Mocha, MD       pneumococcal 20-valent conjugate vaccine (PREVNAR 20) injection 0.5 mL  0.5 mL Intramuscular Tomorrow-1000 Sherren Mocha, MD       propranolol ER (INDERAL LA) 24 hr capsule 60 mg  60 mg Oral Daily Sherren Mocha, MD   60 mg at 05/17/22 1045   QUEtiapine (SEROQUEL) tablet 25 mg  25 mg Oral Pollie Meyer, MD   25 mg at 05/16/22 2204   sodium chloride flush (NS) 0.9 % injection 3 mL  3 mL Intravenous Janalee Dane, MD   3 mL at 05/17/22 0401   sodium chloride flush (NS) 0.9 % injection 3 mL  3 mL Intravenous PRN Sherren Mocha, MD       tamsulosin [facility]) capsule 0.4 mg  0.4 mg Oral QPM Sherren Mocha, MD   0.4 mg at 05/16/22 2048   traZODone (DESYREL) tablet 100 mg   100 mg Oral Pollie Meyer, MD   100 mg at 05/16/22 2205     Discharge Medications: Please see discharge summary for a list of discharge medications.  Relevant Imaging Results:  Relevant Lab Results:   Additional Information [phone]  Milas Gain, LCSWA

## 2022-05-17 NOTE — Progress Notes (Addendum)
Rounding Note    Patient Name: Andre Ross Date of Encounter: 05/17/2022  [place] Cardiologist: None   Subjective   No chest pain, feels weak this morning. Was able to ambulate a few feet with CR.   Lots of ectopy noted on the monitor, but he denies a sensation of palpitations.  What he describes as a feeling that he is just not an [place] marching.  Inpatient Medications    Scheduled Meds:  allopurinol  300 mg Oral Daily   aspirin EC  81 mg Oral Daily   atorvastatin  40 mg Oral Daily   Chlorhexidine Gluconate Cloth  6 each Topical Q0600   cholecalciferol  2,000 Units Oral Daily   citalopram  20 mg Oral Daily   clopidogrel  75 mg Oral Daily   folic acid  1 mg Oral Daily   gabapentin  300 mg Oral TID   influenza vaccine adjuvanted  0.5 mL Intramuscular Tomorrow-1000   isosorbide mononitrate  60 mg Oral Daily   levothyroxine  50 mcg Oral QAC breakfast   multivitamin with minerals  1 tablet Oral Daily   mupirocin ointment  1 Application Nasal BID   pneumococcal 20-valent conjugate vaccine  0.5 mL Intramuscular Tomorrow-1000   propranolol ER  60 mg Oral Daily   QUEtiapine  25 mg Oral QHS   sodium chloride flush  3 mL Intravenous Q12H   tamsulosin  0.4 mg Oral QPM   traZODone  100 mg Oral QHS   Continuous Infusions:  sodium chloride     PRN Meds: sodium chloride, acetaminophen, nitroGLYCERIN, ondansetron (ZOFRAN) IV, sodium chloride flush   Vital Signs    Vitals:   05/17/22 0823 05/17/22 0903 05/17/22 0926 05/17/22 1204  BP: (!) 93/49 (!) 99/58 (!) 98/49 100/61  Pulse: 65 67 66 66  Resp:    20  Temp:    98.2 F (36.8 C)  TempSrc:    Oral  SpO2: 90% 93% 95% 96%  Weight:      Height:        Intake/Output Summary (Last 24 hours) at 05/17/2022 1233 Last data filed at 05/17/2022 0455 Gross per 24 hour  Intake 1806.04 ml  Output 1075 ml  Net 731.04 ml      05/15/2022   10:26 PM 07/25/2021    4:00 AM 07/24/2021    4:00 AM  Last 3 Weights   Weight (lbs) 195 lb 5.2 oz 186 lb 4.6 oz 191 lb 9.3 oz  Weight (kg) 88.6 kg 84.5 kg 86.9 kg      Telemetry    Sinus Rhythm, freq PVCs - Personally Reviewed  ECG    Sinus Rhythm TWI in lateral leads - Personally Reviewed  Physical Exam   GEN: No acute distress.   Neck: No JVD Cardiac: RRR, no murmurs, rubs, or gallops. -Ectopy noted. Respiratory: Clear to auscultation bilaterally.  Nonlabored, good air movement. GI: Soft, nontender, non-distended  MS: No edema; No deformity. Neuro:  Nonfocal  Psych: Normal affect   Labs    High Sensitivity Troponin:   Recent Labs  Lab 05/16/22 0217 05/16/22 0722 05/16/22 1100  TROPONINIHS 144* 150* 153*     Chemistry Recent Labs  Lab 05/16/22 0217 05/17/22 0216  NA 141 141  K 3.8 3.9  CL 106 111  CO2 24 23  GLUCOSE 100* 103*  BUN 45* 33*  CREATININE 1.56* 1.35*  CALCIUM 8.9 8.4*  MG 1.8  --   GFRNONAA 45* 54*  ANIONGAP 11  7    Lipids  Recent Labs  Lab 05/16/22 0217  CHOL 164  TRIG 78  HDL 52  LDLCALC 96  CHOLHDL 3.2    Hematology Recent Labs  Lab 05/16/22 0217 05/17/22 0216  WBC 16.0* 11.3*  RBC 3.77* 3.32*  HGB 11.6* 10.3*  HCT 35.3* 31.4*  MCV 93.6 94.6  MCH 30.8 31.0  MCHC 32.9 32.8  RDW 14.8 15.0  PLT 200 179   Thyroid No results for input(s): "TSH", "FREET4" in the last 168 hours.  BNPNo results for input(s): "BNP", "PROBNP" in the last 168 hours.  DDimer No results for input(s): "DDIMER" in the last 168 hours.   Radiology    CARDIAC CATHETERIZATION  Result Date: 05/17/2022 1.  Severe proximal LAD stenosis treated with a 3.5 x 16 mm Synergy DES, postdilated to high-pressure with a 3.75 mm [place] balloon 2.  Mild to moderate nonobstructive proximal left circumflex stenosis 3.  Widely patent, large dominant RCA with no stenosis 4.  Low LVEDP Recommendations: Continue dual antiplatelet therapy with aspirin and clopidogrel at least 12 months without interruption (ACS class I guideline).  Aggressive risk  reduction measures.  As long as no complications arise, patient could be discharged from the hospital tomorrow.    Cardiac Studies   Cath: 05/16/22   1.  Severe proximal LAD stenosis treated with a 3.5 x 16 mm Synergy DES, postdilated to high-pressure with a 3.75 mm [place] balloon 2.  Mild to moderate nonobstructive proximal left circumflex stenosis 3.  Widely patent, large dominant RCA with no stenosis 4.  Low LVEDP   Recommendations: Continue dual antiplatelet therapy with aspirin and clopidogrel at least 12 months without interruption (ACS class I guideline).  Aggressive risk reduction measures.  As long as no complications arise, patient could be discharged from the hospital tomorrow.   Diagnostic Dominance: Right  Intervention     Patient Profile     76 y.o. male with a PMHx of HTN, CVA, CHF, CKD, HLD, COPD, Gout, OA, PUD, hypothyroidism, diverticulosis, anxiety/depression who was transferred from [facility] on 9/24.  Assessment & Plan    NSTEMI  -- transferred from [facility] with chest pain. Initial plan for lexiscan, but developed EKG changes with TWI in LAD territory with recommendations to transfer to [facility]. hsTn 144>>150. Underwent cardiac cath noted above with severe proximal LAD stenosis treated with PCI/DES x1.  Mild to moderate nonobstructive disease in proximal left circumflex with widely patent large dominant RCA.  Recommendations for DAPT with aspirin/Plavix for at least 1 year. -- continue ASA, plavix, statin, Imdur    HTN -- soft this morning, will hold am BP meds With his underlying unsteady gait and weakness, would like to allow for his blood pressure to be slightly higher than baseline to orthostasis.  However, with his frequent ectopy, would like to be able to use his beta-blocker (had been on propranolol) even at lower dose if possible prior to discharge.   HFpEF -- LVEF of 70-75% with G1DD on echo 07/2021, 60 to 65% by echo in June -- remains  euvolemic on exam -- Will plan to resume home torsemide at discharge   HLD -- LDL 96, HDL 52 -- increased atorvastatin to '40mg'$  daily  -- Will need LFT/FLP in 8 weeks   MDD/GAD/Insomnia -- continue home Seroquel '25mg'$  nightly, Celexa '40mg'$  po daily,  Trazodone '100mg'$  po daily    CKD stage III -- baseline Cr appears around 1.3-1.5, stable at 1.3 post cath   Pre  DM -- Hgb A1c 5.9   Hx of CVA -- on ASA, plavix and statin   ETOH use -- reports 2 beers, 2 shots of vodka 4-5 days a week  Dispo: weak this morning while walking with CR, PT consult ordered with recs for SNF placement  For questions or updates, please contact [place] Please consult [email or web address] for contact info under        Signed, Reino Bellis, NP  05/17/2022, 12:33 PM     ATTENDING ATTESTATION  I have seen, examined and evaluated the patient this AM along with Reino Bellis, NP-C.  After reviewing all the available data and chart, we discussed the patients laboratory, study & physical findings as well as symptoms in detail.  I agree with HER findings, examination as well as impression recommendations as per our discussion.    Attending adjustments noted in italics.   Relatively stable from a cardiac standpoint.  On stable meds, but BP little bit low.  Meds been held.  He had been on propanolol at home along with Imdur.  If anything, would like to be on at least low-dose beta-blocker.  He may not need Imdur now that he has had PCI.  Remains relatively weak.  PT feels like he would be best served with short-term SNF.  Social worker and case management working on placement.   Leonie Man, MD, MS Glenetta Hew, M.D., M.S. Interventional Cardiologist  [place]  Pager # [phone] Phone # [phone] [address] [place],  [address]

## 2022-05-17 NOTE — Evaluation (Signed)
Physical Therapy Evaluation Patient Details Name: Andre Ross MRN: [id number] DOB: 12/27/1945 Today's Date: 05/17/2022  History of Present Illness  Pt is a 76 y/o male admitted from [place] secondary to NSTEMI. Pt is s/p cardiac cath. PMH includes CKD, CVA, HTN, and EtOH use.  Clinical Impression  Pt admitted secondary to problem above with deficits below. Pt requiring mod A for transfers and min A for very short distance gait. Pt fatiguing easily and with increased weakness. Pt's wife at home, but she uses WC and walker and cannot provide physical assist. Recommending ST SNF at d/c to address current deficits. Will continue to follow acutely.        Recommendations for follow up therapy are one component of a multi-disciplinary discharge planning process, led by the attending physician.  Recommendations may be updated based on patient status, additional functional criteria and insurance authorization.  Follow Up Recommendations Skilled nursing-short term rehab (<3 hours/day) Can patient physically be transported by private vehicle: Yes    Assistance Recommended at Discharge Intermittent Supervision/Assistance  Patient can return home with the following  A little help with walking and/or transfers;A little help with bathing/dressing/bathroom;Assistance with cooking/housework;Help with stairs or ramp for entrance;Assist for transportation    Equipment Recommendations Rolling walker (2 wheels)  Recommendations for Other Services       Functional Status Assessment Patient has had a recent decline in their functional status and demonstrates the ability to make significant improvements in function in a reasonable and predictable amount of time.     Precautions / Restrictions Precautions Precautions: Fall Restrictions Weight Bearing Restrictions: No      Mobility  Bed Mobility               General bed mobility comments: In chair upon entry    Transfers Overall transfer  level: Needs assistance Equipment used: Rolling walker (2 wheels) Transfers: Sit to/from Stand Sit to Stand: Mod assist           General transfer comment: Mod A For lift assist and steadying to stand. Required assist to safely lower to chair as pt's legs buckling at end of gait.    Ambulation/Gait Ambulation/Gait assistance: Min assist Gait Distance (Feet): 15 Feet Assistive device: Rolling walker (2 wheels) Gait Pattern/deviations: Step-through pattern, Decreased stride length, Shuffle Gait velocity: Decreased     General Gait Details: Short, shuffled steps. Unsteady and requiring min A for steadying. Very poor tolerance and fatiguing easily  Science writer    Modified Rankin (Stroke Patients Only)       Balance Overall balance assessment: Needs assistance Sitting-balance support: No upper extremity supported, Feet supported Sitting balance-Leahy Scale: Fair     Standing balance support: Bilateral upper extremity supported Standing balance-Leahy Scale: Poor Standing balance comment: Reliant on BUE support and external support                             Pertinent Vitals/Pain Pain Assessment Pain Assessment: No/denies pain    Home Living Family/patient expects to be discharged to:: Private residence Living Arrangements: Spouse/significant other Available Help at Discharge: Available PRN/intermittently Type of Home: House Home Access: Ramped entrance       Home Layout: One level Home Equipment: Grab bars - tub/shower;Cane - single point;Shower Land (2 wheels) Additional Comments: Wife is in a [facility]    Prior Function Prior Level of Function :  Independent/Modified Independent             Mobility Comments: Uses cane for ambulation. ADLs Comments: Able to bathe and dress independently     Hand Dominance        Extremity/Trunk Assessment   Upper Extremity Assessment Upper Extremity Assessment:  Generalized weakness    Lower Extremity Assessment Lower Extremity Assessment: Generalized weakness    Cervical / Trunk Assessment Cervical / Trunk Assessment: Kyphotic  Communication   Communication: HOH  Cognition Arousal/Alertness: Awake/alert Behavior During Therapy: WFL for tasks assessed/performed Overall Cognitive Status: Within Functional Limits for tasks assessed                                          General Comments      Exercises     Assessment/Plan    PT Assessment Patient needs continued PT services  PT Problem List Decreased strength;Decreased activity tolerance;Decreased balance;Decreased mobility;Decreased knowledge of precautions       PT Treatment Interventions DME instruction;Gait training;Functional mobility training;Therapeutic activities;Therapeutic exercise;Balance training;Patient/family education    PT Goals (Current goals can be found in the Care Plan section)  Acute Rehab PT Goals Patient Stated Goal: to get stronger PT Goal Formulation: With patient Time For Goal Achievement: 05/31/22 Potential to Achieve Goals: Good    Frequency Min 2X/week     Co-evaluation               AM-PAC PT "6 Clicks" Mobility  Outcome Measure Help needed turning from your back to your side while in a flat bed without using bedrails?: A Little Help needed moving from lying on your back to sitting on the side of a flat bed without using bedrails?: A Lot Help needed moving to and from a bed to a chair (including a wheelchair)?: A Little Help needed standing up from a chair using your arms (e.g., wheelchair or bedside chair)?: A Lot Help needed to walk in hospital room?: A Little Help needed climbing 3-5 steps with a railing? : A Lot 6 Click Score: 15    End of Session Equipment Utilized During Treatment: Gait belt Activity Tolerance: Patient limited by fatigue Patient left: in chair;with call bell/phone within reach Nurse  Communication: Mobility status PT Visit Diagnosis: Unsteadiness on feet (R26.81);Muscle weakness (generalized) (M62.81)    Time: 3016-0109 PT Time Calculation (min) (ACUTE ONLY): 14 min   Charges:   PT Evaluation $PT Eval Moderate Complexity: 1 Mod          Reuel Derby, PT, DPT  Acute Rehabilitation Services  Office: [phone]   Rudean Hitt 05/17/2022, 12:07 PM

## 2022-05-18 ENCOUNTER — Other Ambulatory Visit (HOSPITAL_COMMUNITY): Payer: Self-pay

## 2022-05-18 ENCOUNTER — Telehealth: Payer: Self-pay | Admitting: Cardiology

## 2022-05-18 DIAGNOSIS — E782 Mixed hyperlipidemia: Secondary | ICD-10-CM | POA: Diagnosis not present

## 2022-05-18 DIAGNOSIS — R778 Other specified abnormalities of plasma proteins: Secondary | ICD-10-CM | POA: Diagnosis not present

## 2022-05-18 DIAGNOSIS — I25110 Atherosclerotic heart disease of native coronary artery with unstable angina pectoris: Secondary | ICD-10-CM

## 2022-05-18 DIAGNOSIS — I214 Non-ST elevation (NSTEMI) myocardial infarction: Secondary | ICD-10-CM | POA: Diagnosis not present

## 2022-05-18 DIAGNOSIS — N179 Acute kidney failure, unspecified: Secondary | ICD-10-CM

## 2022-05-18 DIAGNOSIS — R7303 Prediabetes: Secondary | ICD-10-CM

## 2022-05-18 DIAGNOSIS — Z955 Presence of coronary angioplasty implant and graft: Secondary | ICD-10-CM

## 2022-05-18 DIAGNOSIS — F1090 Alcohol use, unspecified, uncomplicated: Secondary | ICD-10-CM

## 2022-05-18 LAB — LIPOPROTEIN A (LPA): Lipoprotein (a): 66 nmol/L — ABNORMAL HIGH (ref <75.0)

## 2022-05-18 MED ORDER — QUETIAPINE FUMARATE 25 MG PO TABS
25.0000 mg | ORAL_TABLET | Freq: Every day | ORAL | 1 refills | Status: DC
  Filled 2022-05-18: qty 30, 30d supply, fill #0

## 2022-05-18 MED ORDER — PROPRANOLOL HCL ER 60 MG PO CP24
60.0000 mg | ORAL_CAPSULE | Freq: Every day | ORAL | 6 refills | Status: DC
  Filled 2022-05-18: qty 30, 30d supply, fill #0

## 2022-05-18 MED ORDER — ASPIRIN 81 MG PO TBEC
81.0000 mg | DELAYED_RELEASE_TABLET | Freq: Every day | ORAL | 12 refills | Status: DC
  Filled 2022-05-18: qty 30, 30d supply, fill #0

## 2022-05-18 MED ORDER — NITROGLYCERIN 0.4 MG SL SUBL
0.4000 mg | SUBLINGUAL_TABLET | SUBLINGUAL | 12 refills | Status: DC | PRN
  Filled 2022-05-18: qty 25, 1d supply, fill #0

## 2022-05-18 MED ORDER — FUROSEMIDE 40 MG PO TABS
20.0000 mg | ORAL_TABLET | Freq: Every day | ORAL | 3 refills | Status: DC
  Filled 2022-05-18: qty 30, 60d supply, fill #0

## 2022-05-18 MED ORDER — ISOSORBIDE MONONITRATE ER 30 MG PO TB24
30.0000 mg | ORAL_TABLET | Freq: Every day | ORAL | 6 refills | Status: DC
  Filled 2022-05-18: qty 30, 30d supply, fill #0

## 2022-05-18 MED ORDER — ATORVASTATIN CALCIUM 40 MG PO TABS
40.0000 mg | ORAL_TABLET | Freq: Every day | ORAL | 6 refills | Status: DC
  Filled 2022-05-18: qty 30, 30d supply, fill #0

## 2022-05-18 NOTE — Progress Notes (Signed)
CARDIAC REHAB PHASE I   PRE:  Rate/Rhythm: 71 SR   BP:  Sitting: 112/72      SaO2: 93 RA  MODE:  Ambulation: 50 ft   POST:  Rate/Rhythm: 65 SR  BP:  Sitting: 110/59      SaO2: 96 RA   Pt ambulated in hall with assistance using front wheel walker. Returned to room to chair for breakfast. Call bell and bedside table in reach. No questions or concerns regarding teaching provided yesterday. Plan for SNF placement when bed available. Will continue to follow.   [id number]  Vanessa Barbara, RN BSN 05/18/2022 10:28 AM

## 2022-05-18 NOTE — TOC Progression Note (Addendum)
Transition of Care [facility]) - Progression Note    Patient Details  Name: Andre Ross MRN: [id number] Date of Birth: 1945/10/22  Transition of Care [facility]) CM/SW [place], [place] Phone Number: 05/18/2022, 10:19 AM  Clinical Narrative:     CSW provided patient with SNF bed offers. Patient accepted SNF bed offer with [facility]. CSW spoke with Broadus John with [facility] who confirmed SNF bed offer for patient through medicare benefits. Broadus John confirmed patient can dc over today if medically ready. CSW informed MD. CSW will continue to follow and assist with patients dc planning needs.  Expected Discharge Plan: [place] Barriers to Discharge: Continued Medical Work up  Expected Discharge Plan and Services Expected Discharge Plan: [place] In-house Referral: Clinical Social Work     Living arrangements for the past 2 months: Single Family Home                                       Social Determinants of Health (SDOH) Interventions    Readmission Risk Interventions     No data to display

## 2022-05-18 NOTE — Progress Notes (Addendum)
Rounding Note    Patient Name: Andre Ross Date of Encounter: 05/18/2022  [facility] Cardiologist: Dr. Bettina Gavia  Subjective   Feels better today. No chest pain or dyspnea. Waiting SNF placement.  Still feels a little bit woozy and groggy at times, but otherwise doing well.  Inpatient Medications    Scheduled Meds:  allopurinol  300 mg Oral Daily   aspirin EC  81 mg Oral Daily   atorvastatin  40 mg Oral Daily   Chlorhexidine Gluconate Cloth  6 each Topical Q0600   cholecalciferol  2,000 Units Oral Daily   citalopram  20 mg Oral Daily   clopidogrel  75 mg Oral Daily   ferrous sulfate  325 mg Oral BID WC   folic acid  1 mg Oral Daily   gabapentin  300 mg Oral TID   influenza vaccine adjuvanted  0.5 mL Intramuscular Tomorrow-1000   isosorbide mononitrate  60 mg Oral Daily   levothyroxine  50 mcg Oral QAC breakfast   multivitamin with minerals  1 tablet Oral Daily   mupirocin ointment  1 Application Nasal BID   pneumococcal 20-valent conjugate vaccine  0.5 mL Intramuscular Tomorrow-1000   propranolol ER  60 mg Oral Daily   QUEtiapine  25 mg Oral QHS   sodium chloride flush  3 mL Intravenous Q12H   tamsulosin  0.4 mg Oral QPM   traZODone  100 mg Oral QHS   Continuous Infusions:  sodium chloride     PRN Meds: sodium chloride, acetaminophen, nitroGLYCERIN, ondansetron (ZOFRAN) IV, sodium chloride flush   Vital Signs    Vitals:   05/17/22 0926 05/17/22 1204 05/17/22 2015 05/18/22 0415  BP: (!) 98/49 100/61 (!) 121/54 (!) 108/96  Pulse: 66 66 71 63  Resp:  '20 19 18  '$ Temp:  98.2 F (36.8 C) 98.6 F (37 C) 98.6 F (37 C)  TempSrc:  Oral Oral Oral  SpO2: 95% 96% 94% 90%  Weight:      Height:        Intake/Output Summary (Last 24 hours) at 05/18/2022 0804 Last data filed at 05/18/2022 0634 Gross per 24 hour  Intake --  Output 610 ml  Net -610 ml      05/15/2022   10:26 PM 07/25/2021    4:00 AM 07/24/2021    4:00 AM  Last 3 Weights  Weight (lbs)  195 lb 5.2 oz 186 lb 4.6 oz 191 lb 9.3 oz  Weight (kg) 88.6 kg 84.5 kg 86.9 kg      Telemetry    SR, PVC, venti, trigemny - Personally Reviewed  ECG   N/A  Physical Exam   GEN: No acute distress.   Neck: No JVD, or bruit. Cardiac: RRR-with ectopy.  Normal S1 and S2., no murmurs, rubs, or gallops.  Respiratory: Clear to auscultation bilaterally.  Nonlabored, good air movement. GI: Soft, nontender, non-distended  MS: No edema; No deformity. Neuro:  Nonfocal  Psych: Normal affect   Labs    High Sensitivity Troponin:   Recent Labs  Lab 05/16/22 0217 05/16/22 0722 05/16/22 1100  TROPONINIHS 144* 150* 153*     Chemistry Recent Labs  Lab 05/16/22 0217 05/17/22 0216  NA 141 141  K 3.8 3.9  CL 106 111  CO2 24 23  GLUCOSE 100* 103*  BUN 45* 33*  CREATININE 1.56* 1.35*  CALCIUM 8.9 8.4*  MG 1.8  --   GFRNONAA 45* 54*  ANIONGAP 11 7    Lipids  Recent Labs  Lab 05/16/22 0217  CHOL 164  TRIG 78  HDL 52  LDLCALC 96  CHOLHDL 3.2    Hematology Recent Labs  Lab 05/16/22 0217 05/17/22 0216  WBC 16.0* 11.3*  RBC 3.77* 3.32*  HGB 11.6* 10.3*  HCT 35.3* 31.4*  MCV 93.6 94.6  MCH 30.8 31.0  MCHC 32.9 32.8  RDW 14.8 15.0  PLT 200 179     Radiology    CARDIAC CATHETERIZATION  Result Date: 05/17/2022 1.  Severe proximal LAD stenosis treated with a 3.5 x 16 mm Synergy DES, postdilated to high-pressure with a 3.75 mm [place] balloon 2.  Mild to moderate nonobstructive proximal left circumflex stenosis 3.  Widely patent, large dominant RCA with no stenosis 4.  Low LVEDP Recommendations: Continue dual antiplatelet therapy with aspirin and clopidogrel at least 12 months without interruption (ACS class I guideline).  Aggressive risk reduction measures.  As long as no complications arise, patient could be discharged from the hospital tomorrow.    Cardiac Studies   Cath: 05/16/22   1.  Severe proximal LAD stenosis treated with a 3.5 x 16 mm Synergy DES, postdilated to  high-pressure with a 3.75 mm  balloon 2.  Mild to moderate nonobstructive proximal left circumflex stenosis 3.  Widely patent, large dominant RCA with no stenosis 4.  Low LVEDP   Recommendations: Continue dual antiplatelet therapy with aspirin and clopidogrel at least 12 months without interruption (ACS class I guideline).  Aggressive risk reduction measures.  As long as no complications arise, patient could be discharged from the hospital tomorrow.   Diagnostic   Dominance: Right      Intervention       TTE from June 2023: Mild concentric LVH.  Normal LV function-EF 55 to 60%.  No RWMA.  Moderate LA dilation with diastolic dysfunction/impaired relaxation.  Mild RA enlargement.  Moderate PH -estimated PASP /RVSP 50 mmHg.    Patient Profile     76 y.o. male with a PMHx of HTN, CVA, CHF, CKD, HLD, COPD, Gout, OA, PUD, hypothyroidism, diverticulosis, anxiety/depression who was transferred from [facility] on 9/24.  Assessment & Plan    NSTEMI  -- Transferred from [facility] with chest pain. Initial plan for lexiscan, but developed EKG changes with TWI in LAD territory with recommendations to transfer to [facility]. hsTn 144>>150. Underwent cardiac cath noted above with severe proximal LAD stenosis treated with PCI/DES x1.  Mild to moderate nonobstructive disease in proximal left circumflex with widely patent large dominant RCA.  Recommendations for DAPT with aspirin/Plavix for at least 1 year. -- continue ASA, plavix, statin, Imdur    HTN -- BP was soft yesterday, now improved.  -  Continue BB -Reduce Imdur dose to 30 mg daily.  PVC - Frequent PVC and ventricular trigeminy.  - Continue propranolol - K 3.9 - Mg 1.8   HFpEF (chronic diastolic heart failure) -- LVEF of 70-75% with G1DD on echo 07/2021, 60 to 65% by echo in June -- remains euvolemic on exam -- Will plan to resume home torsemide at discharge -> start every other day with one half dose)   HLD -- LDL 96, HDL  52 -- increased atorvastatin to '40mg'$  daily  -- Will need LFT/FLP in 8 weeks   MDD/GAD/Insomnia -- continue home Seroquel '25mg'$  nightly, Celexa '40mg'$  po daily,  Trazodone '100mg'$  po daily    CKD stage IIIa -- baseline Cr appears around 1.3-1.5, stable at 1.3 post cath   Pre DM -- Hgb A1c 5.9  Hx of CVA -- on ASA, plavix and statin   ETOH use -- reports 2 beers, 2 shots of vodka 4-5 days a week Wife is very concerned about the [place] alcohol he drinks and its affecting his heart.  Counseling provided provided.   Dispo: Pending SNF placement => Room Available at [facility] as of noon 05/18/2022.  Anticipate discharge to this afternoon.    For questions or updates, please contact [facility] [place] Please consult [email or web address] for contact info under        Signed, Leanor Kail, PA  05/18/2022, 8:04 AM      ATTENDING ATTESTATION  I have seen, examined and evaluated the patient along with Mr. Curly [place], [place].Marland Kitchen  After reviewing all the available data and chart, we discussed the patients laboratory, study & physical findings as well as symptoms in detail.  I agree with his findings, examination as well as impression recommendations as per our discussion.    Attending adjustments noted in italics.   Remains relatively stable.  Blood pressure has been stabilized.  Will reduce dose of Imdur for discharge and continue current dose propranolol.  Otherwise no significant changes.  He is scheduled for discharge today to [place] later on this afternoon.  Okay for discharge.    Leonie Man, MD, MS Glenetta Hew, M.D., M.S. Interventional Cardiologist  [place]  Pager # [phone] Phone # [phone] [address] [place], [place] [address]

## 2022-05-18 NOTE — Telephone Encounter (Signed)
TOC appt on 10/12  at 2:20pm with Vin Bhagat per [facility]

## 2022-05-18 NOTE — TOC Transition Note (Signed)
Transition of Care [facility]) - CM/SW Discharge Note   Patient Details  Name: Andre Ross MRN: [id number] Date of Birth: 24-Aug-1945  Transition of Care [facility]) CM/SW Contact:  Milas Gain, [place] Phone Number: 05/18/2022, 1:27 PM   Clinical Narrative:     Patient will DC to: [facility] date: 05/18/2022  Family notified: Inez Catalina  Transport by: Corey Harold  ?  Per MD patient ready for DC to [facility] and [facility], patient, patient's family, and facility notified of DC. Discharge Summary sent to facility. RN given number for report tele# [phone] RM# [phone]. DC packet on chart. Ambulance transport requested for patient.  CSW signing off.   Final next level of care: Skilled Nursing Facility Barriers to Discharge: No Barriers Identified   Patient Goals and CMS Choice Patient states their goals for this hospitalization and ongoing recovery are:: SNF CMS Medicare.gov Compare Post Acute Care list provided to:: Patient Choice offered to / list presented to : Patient  Discharge Placement              Patient chooses bed at:  [facility]) Patient to be transferred to facility by: [place] Name of family member notified: Inez Catalina Patient and family notified of of transfer: 05/18/22  Discharge Plan and Services In-house Referral: Clinical Social Work                                   Social Determinants of Health ([place]) Interventions     Readmission Risk Interventions     No data to display

## 2022-05-18 NOTE — Progress Notes (Signed)
Mobility Specialist - Progress Note   05/18/22 1132  Mobility  Activity Ambulated with assistance in hallway  Level of Assistance Minimal assist, patient does 75% or more  Assistive Device Front wheel walker  Distance Ambulated (ft) 50 ft  Activity Response Tolerated well  $Mobility charge 1 Mobility   Pt was received in chair and agreeable to mobility. No complaints throughout ambulation. Pt was returned to chair with all needs met.  Larey Seat

## 2022-05-18 NOTE — Discharge Summary (Signed)
Discharge Summary    Patient ID: Andre Ross MRN: [id number]; DOB: 1945/12/24  Admit date: 05/15/2022 Discharge date: 05/18/2022  PCP:  Nicoletta Dress, MD   [facility] Cardiologist:  Dr. Bettina Gavia  Discharge Diagnoses    Principal Problem:   NSTEMI (non-ST elevated myocardial infarction) [facility]) Active Problems:   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 and stage 3 chronic kidney disease (HCC)   Hyperlipidemia   CKD (chronic kidney disease) stage 3, GFR 30-59 ml/min (HCC)   Elevated troponin   Diagnostic Studies/Procedures    Echo 05/11/22 at [facility] LVEF: 55-60% and mild MR  Cath: 05/16/22   1.  Severe proximal LAD stenosis treated with a 3.5 x 16 mm Synergy DES, postdilated to high-pressure with a 3.75 mm [place] balloon 2.  Mild to moderate nonobstructive proximal left circumflex stenosis 3.  Widely patent, large dominant RCA with no stenosis 4.  Low LVEDP   Recommendations: Continue dual antiplatelet therapy with aspirin and clopidogrel at least 12 months without interruption (ACS class I guideline).  Aggressive risk reduction measures.  As long as no complications arise, patient could be discharged from the hospital tomorrow.   Diagnostic   Dominance: Right                                                             Intervention        History of Present Illness     Andre Ross is a 76 y.o. male with a PMHx of HTN, CVA, CHF, CKD, HLD, COPD, Gout, OA, PUD, hypothyroidism, diverticulosis, anxiety/depression who was transferred from [facility] on 9/24.   Patient initially presented to [facility] on 9/19 with dyspnea, chest pain, orthopnea and worsening lower extremity edema.  Labs notable for hyperkalemia, elevated BNP, elevated troponin on admission.  ECG stable.  Heparin and nitro infusions were started.  Patient was also hypoxic requiring BiPAP with concerns for COPD exacerbation.   Heparin drip was continued for 48 hours and DC'd on 9/21.  Lexiscan stress test was ordered, but prior to administration of Lexiscan, ECG displayed T wave inversion suggesting significant LAD disease.  Plan was to restart heparin infusion, but due to loss of IV access, patient was started on Lovenox 1 mg/kg SQ. transferred to [facility] was made for coronary angiography.     Labs on day of transfer outlined below. CBC: WBC 17.2, hemoglobin 11.5, hematocrit 34.5, platelet 197 BMP: Sodium 135, potassium 3.9, chloride 105, bicarb 27, BUN 47, creatinine 1.3, glucose 97, calcium 9.0   Upon arrival to [facility], patient admits to continued chest discomfort.  He is unsure why he has been transferred to this hospital.  He admits to improvement in lower extremity edema and shortness of breath. He is able to lie flat without any orthopnea.  There are no other concerns this time.     Hospital Course     Consultants: noe  NSTEMI  -- Transferred from [facility] with chest pain. Initial plan for lexiscan, but developed EKG changes with TWI in LAD territory with recommendations to transfer to [facility]. hsTn 144>>150. Normal EF by echo at [facility]. Underwent cardiac cath noted above with severe proximal LAD stenosis treated with PCI/DES x1.  Mild to moderate  nonobstructive disease in proximal left circumflex with widely patent large dominant RCA.  Recommendations for DAPT with aspirin/Plavix for at least 1 year. -- continue ASA, plavix, statin, Imdur  and BB   HTN - BP was soft yesterday, now improved.  - Continue Propranolol  - Reduced Imdur dose to 30 mg daily.   PVC - Frequent PVC and ventricular trigeminy.  - Continue propranolol - K 3.9 - Mg 1.8   HFpEF -- LVEF of 70-75% with G1DD on echo 07/2021, 60 to 65% by echo in June, EF 55-60% this admission at outside hospital  -- remains euvolemic on exam -- Home lasix on hold >> resume at discharge at 1/2 dose at 19m qd    HLD -- LDL 96, HDL  52 -- increased atorvastatin to 421mdaily  -- Will need LFT/FLP in 8 weeks   MDD/GAD/Insomnia -- continue home Seroquel 2539mightly, Celexa 30m80m daily,  Trazodone 100mg37mdaily    CKD stage IIIa -- baseline Cr appears around 1.3-1.5, stable at 1.3 post cath   Pre DM -- Hgb A1c 5.9   Hx of CVA -- on ASA, plavix and statin   ETOH use -- reports 2 beers, 2 shots of vodka 4-5 days a week Wife is very concerned about the [place] alcohol he drinks and its affecting his heart.  Counseling provided provided.   Did the patient have an acute coronary syndrome (MI, NSTEMI, STEMI, etc) this admission?:  Yes                               AHA/ACC Clinical Performance & Quality Measures: Aspirin prescribed? - Yes ADP Receptor Inhibitor (Plavix/Clopidogrel, Brilinta/Ticagrelor or Effient/Prasugrel) prescribed (includes medically managed patients)? - Yes Beta Blocker prescribed? - Yes High Intensity Statin (Lipitor 40-80mg 67mrestor 20-30mg) 26mcribed? - Yes EF assessed during THIS hospitalization? - Yes For EF <40%, was ACEI/ARB prescribed? - Not Applicable (EF >/= 40%) Fo61%F <40%, Aldosterone Antagonist (Spironolactone or Eplerenone) prescribed? - Not Applicable (EF >/= 40%) Ca60%ac Rehab Phase II ordered (including medically managed patients)? - Yes   The patient will be scheduled for a TOC follow up appointment in 14 days.  A message has been [facility] the office where the patient should be seen for follow up.    Discharge Vitals Blood pressure 112/72, pulse 69, temperature 97.7 F (36.5 C), temperature source Oral, resp. rate 17, height 5' 9"  (1.753 m), weight 88.6 kg, SpO2 96 %.  Filed Weights   05/15/22 2226  Weight: 88.6 kg    Labs & Radiologic Studies    CBC Recent Labs    05/16/22 0217 05/17/22 0216  WBC 16.0* 11.3*  NEUTROABS 11.1*  --   HGB 11.6* 10.3*  HCT 35.3* 31.4*  MCV 93.6 94.6  PLT 200 179   B737c Metabolic  Panel Recent Labs    05/16/22 0217 05/17/22 0216  NA 141 141  K 3.8 3.9  CL 106 111  CO2 24 23  GLUCOSE 100* 103*  BUN 45* 33*  CREATININE 1.56* 1.35*  CALCIUM 8.9 8.4*  MG 1.8  --     High Sensitivity Troponin:   Recent Labs  Lab 05/16/22 0217 05/16/22 0722 05/16/22 1100  TROPONINIHS 144* 150* 153*     Hemoglobin A1C Recent Labs    05/16/22 0217  HGBA1C 5.9*   Fasting Lipid Panel Recent Labs    05/16/22  0217  CHOL 164  HDL 52  LDLCALC 96  TRIG 78  CHOLHDL 3.2   _____________  CARDIAC CATHETERIZATION  Result Date: 05/17/2022 1.  Severe proximal LAD stenosis treated with a 3.5 x 16 mm Synergy DES, postdilated to high-pressure with a 3.75 mm [place] balloon 2.  Mild to moderate nonobstructive proximal left circumflex stenosis 3.  Widely patent, large dominant RCA with no stenosis 4.  Low LVEDP Recommendations: Continue dual antiplatelet therapy with aspirin and clopidogrel at least 12 months without interruption (ACS class I guideline).  Aggressive risk reduction measures.  As long as no complications arise, patient could be discharged from the hospital tomorrow.    Disposition   Pt is being discharged home today in good condition.  Follow-up Plans & Appointments     Follow-up Information     [place], Crista Luria, [place] Follow up on 06/02/2022.   Specialty: Cardiology Why: @2 :20pm for hospital follow up Contact information: [place] [id number] [phone]                Discharge Instructions     Amb Referral to Cardiac Rehabilitation   Complete by: As directed    [place]   Diagnosis:  Coronary Stents NSTEMI     After initial evaluation and assessments completed: Virtual Based Care may be provided alone or in conjunction with Phase 2 Cardiac Rehab based on patient barriers.: Yes   Intensive Cardiac Rehabilitation (ICR) [place] location only OR Traditional Cardiac Rehabilitation (TCR) *If criteria for ICR are not met will  enroll in TCR [facility] only): Yes   Diet - low sodium heart healthy   Complete by: As directed    Discharge instructions   Complete by: As directed    NO HEAVY LIFTING (>10lbs) X 2 WEEKS. NO SEXUAL ACTIVITY X 2 WEEKS. NO DRIVING X 1 WEEK. NO SOAKING BATHS, HOT TUBS, POOLS, ETC., X 7 DAYS.   Increase activity slowly   Complete by: As directed        Discharge Medications   Allergies as of 05/18/2022       Reactions   Aspirin Other (See Comments)   GI upset   Hydrocodone Itching        Medication List     STOP taking these medications    amLODipine 5 MG tablet Commonly known as: NORVASC   azithromycin 500 MG tablet Commonly known as: ZITHROMAX   simvastatin 20 MG tablet Commonly known as: ZOCOR       TAKE these medications    acetaminophen 500 MG tablet Commonly known as: TYLENOL Take 1 tablet by mouth 4 (four) times daily as needed for moderate pain or headache.   albuterol 108 (90 Base) MCG/ACT inhaler Commonly known as: VENTOLIN HFA Inhale 2 puffs into the lungs every 6 (six) hours as needed for wheezing or shortness of breath.   allopurinol 300 MG tablet Commonly known as: ZYLOPRIM Take 150 mg by mouth daily.   aspirin EC 81 MG tablet Take 1 tablet (81 mg total) by mouth daily. Swallow whole. Start taking on: May 19, 2022   atorvastatin 40 MG tablet Commonly known as: LIPITOR Take 1 tablet (40 mg total) by mouth daily. Start taking on: May 19, 2022   capsaicin 0.025 % cream Commonly known as: ZOSTRIX Apply 1 Application topically 2 (two) times daily. Apply thin layer to affected area twice a day for neuropathy in feet.   Carboxymethylcellulose Sod PF 0.25 % Soln Apply 1 drop to  eye 4 (four) times daily as needed (dry eyes).   citalopram 40 MG tablet Commonly known as: CELEXA Take 20 mg by mouth daily.   clopidogrel 75 MG tablet Commonly known as: PLAVIX Take 75 mg by mouth daily.   cyanocobalamin 500 MCG tablet Commonly  known as: VITAMIN B12 Take 1,000 mcg by mouth daily.   ferrous sulfate 325 (65 FE) MG tablet Take 325 mg by mouth 2 (two) times daily after a meal.   fluticasone 50 MCG/ACT nasal spray Commonly known as: FLONASE Place 1 spray into both nostrils 2 (two) times daily.   folic acid 1 MG tablet Commonly known as: FOLVITE Take 1 mg by mouth daily.   furosemide 40 MG tablet Commonly known as: LASIX Take 0.5 tablets (20 mg total) by mouth daily. What changed: how much to take   gabapentin 300 MG capsule Commonly known as: NEURONTIN Take 300 mg by mouth 3 (three) times daily.   isosorbide mononitrate 30 MG 24 hr tablet Commonly known as: IMDUR Take 1 tablet (30 mg total) by mouth daily. What changed:  medication strength how much to take   ketotifen 0.025 % ophthalmic solution Commonly known as: ZADITOR Place 1 drop into both eyes 2 (two) times daily.   levothyroxine 50 MCG tablet Commonly known as: SYNTHROID Take 50 mcg by mouth daily before breakfast.   loratadine 10 MG tablet Commonly known as: CLARITIN Take 10 mg by mouth daily as needed for allergies.   Magnesium Oxide 420 MG Tabs Take 420 mg by mouth 2 (two) times daily after a meal.   multivitamin tablet Take 1 tablet by mouth daily.   nitroGLYCERIN 0.4 MG SL tablet Commonly known as: NITROSTAT Place 1 tablet (0.4 mg total) under the tongue every 5 (five) minutes x 3 doses as needed for chest pain.   pantoprazole 40 MG tablet Commonly known as: PROTONIX Take 40 mg by mouth daily.   propranolol ER 60 MG 24 hr capsule Commonly known as: INDERAL LA Take 1 capsule (60 mg total) by mouth daily. What changed:  medication strength how much to take   QUEtiapine 25 MG tablet Commonly known as: SEROQUEL Take 1 tablet (25 mg total) by mouth at bedtime.   Spiriva Respimat 1.25 MCG/ACT Aers Generic drug: Tiotropium Bromide Monohydrate Inhale 2 Pump into the lungs daily.   traZODone 100 MG tablet Commonly known  as: DESYREL Take 100 mg by mouth at bedtime.   triamcinolone cream 0.1 % Commonly known as: KENALOG Apply 1 Application topically 2 (two) times daily as needed (rash).   Vitamin D 50 MCG (2000 UT) tablet Take 2,000 Units by mouth daily.           Outstanding Labs/Studies   Lipid panel and LFTS in 8 weeks   Duration of Discharge Encounter   Greater than 30 minutes including physician time.  Mahalia Longest Kilmichael, PA 05/18/2022, 1:00 PM

## 2022-05-19 DIAGNOSIS — I5030 Unspecified diastolic (congestive) heart failure: Secondary | ICD-10-CM | POA: Diagnosis not present

## 2022-05-19 DIAGNOSIS — I493 Ventricular premature depolarization: Secondary | ICD-10-CM | POA: Diagnosis not present

## 2022-05-19 DIAGNOSIS — I10 Essential (primary) hypertension: Secondary | ICD-10-CM | POA: Diagnosis not present

## 2022-05-19 DIAGNOSIS — I214 Non-ST elevation (NSTEMI) myocardial infarction: Secondary | ICD-10-CM | POA: Diagnosis not present

## 2022-05-19 NOTE — Telephone Encounter (Signed)
**Note De-Identified  Obfuscation** Transition Care Management Unsuccessful Follow-up Telephone Call  Date of discharge and from where:  05/18/2022 from [facility]  Attempts:  1st Attempt  Reason for unsuccessful TCM follow-up call:  Left voice message

## 2022-05-20 ENCOUNTER — Telehealth (HOSPITAL_COMMUNITY): Payer: Self-pay

## 2022-05-20 NOTE — Telephone Encounter (Signed)
Per phase I cardiac rehab, fax referral to [facility]. 

## 2022-05-20 NOTE — Telephone Encounter (Addendum)
**Note De-Identified  Obfuscation** The pts wife, Inez Catalina states that the pt was discharged from [facility] on 9/27 and was taken to [facility] for 2-3 weeks of rehab.  TOC call is not needed.

## 2022-05-27 DIAGNOSIS — I214 Non-ST elevation (NSTEMI) myocardial infarction: Secondary | ICD-10-CM | POA: Diagnosis not present

## 2022-05-27 DIAGNOSIS — I5030 Unspecified diastolic (congestive) heart failure: Secondary | ICD-10-CM | POA: Diagnosis not present

## 2022-05-27 DIAGNOSIS — R21 Rash and other nonspecific skin eruption: Secondary | ICD-10-CM | POA: Diagnosis not present

## 2022-05-27 DIAGNOSIS — I10 Essential (primary) hypertension: Secondary | ICD-10-CM | POA: Diagnosis not present

## 2022-05-30 DIAGNOSIS — R21 Rash and other nonspecific skin eruption: Secondary | ICD-10-CM | POA: Diagnosis not present

## 2022-06-01 DIAGNOSIS — R21 Rash and other nonspecific skin eruption: Secondary | ICD-10-CM | POA: Diagnosis not present

## 2022-06-01 DIAGNOSIS — I214 Non-ST elevation (NSTEMI) myocardial infarction: Secondary | ICD-10-CM | POA: Diagnosis not present

## 2022-06-02 ENCOUNTER — Ambulatory Visit: Payer: Medicare Other | Attending: Physician Assistant | Admitting: Nurse Practitioner

## 2022-06-02 ENCOUNTER — Encounter: Payer: Self-pay | Admitting: Physician Assistant

## 2022-06-02 VITALS — BP 116/62 | HR 72 | Ht 69.0 in | Wt 189.6 lb

## 2022-06-02 DIAGNOSIS — I10 Essential (primary) hypertension: Secondary | ICD-10-CM | POA: Diagnosis not present

## 2022-06-02 DIAGNOSIS — L509 Urticaria, unspecified: Secondary | ICD-10-CM | POA: Diagnosis not present

## 2022-06-02 DIAGNOSIS — I214 Non-ST elevation (NSTEMI) myocardial infarction: Secondary | ICD-10-CM | POA: Insufficient documentation

## 2022-06-02 DIAGNOSIS — R400 Somnolence: Secondary | ICD-10-CM | POA: Insufficient documentation

## 2022-06-02 DIAGNOSIS — I5032 Chronic diastolic (congestive) heart failure: Secondary | ICD-10-CM | POA: Diagnosis not present

## 2022-06-02 DIAGNOSIS — Z955 Presence of coronary angioplasty implant and graft: Secondary | ICD-10-CM | POA: Insufficient documentation

## 2022-06-02 DIAGNOSIS — E782 Mixed hyperlipidemia: Secondary | ICD-10-CM | POA: Insufficient documentation

## 2022-06-02 DIAGNOSIS — I25118 Atherosclerotic heart disease of native coronary artery with other forms of angina pectoris: Secondary | ICD-10-CM | POA: Insufficient documentation

## 2022-06-02 DIAGNOSIS — N1831 Chronic kidney disease, stage 3a: Secondary | ICD-10-CM | POA: Diagnosis not present

## 2022-06-02 DIAGNOSIS — R0683 Snoring: Secondary | ICD-10-CM | POA: Insufficient documentation

## 2022-06-02 MED ORDER — AMLODIPINE BESYLATE 5 MG PO TABS: 5.0000 mg | ORAL_TABLET | Freq: Every day | ORAL | 0 refills | Status: DC

## 2022-06-02 MED ORDER — AMLODIPINE BESYLATE 5 MG PO TABS: 5.0000 mg | ORAL_TABLET | Freq: Every day | ORAL | 3 refills | Status: DC

## 2022-06-02 MED ORDER — AMLODIPINE BESYLATE 5 MG PO TABS: 5.0000 mg | ORAL_TABLET | Freq: Every day | ORAL | 2 refills | Status: DC

## 2022-06-02 NOTE — Progress Notes (Signed)
Cardiology Office Note:    Date:  06/03/2022   ID:  Andre Ross, Andre Ross Dec 22, 1945, MRN [id number]  PCP:  Nicoletta Dress, MD  [facility] Cardiologist:  None  [facility] Electrophysiologist:  None   Chief Complaint: hospital follow up   History of Present Illness:    Andre Ross is a 76 y.o. male with a hx of  HTN, CVA, CHF, CKD, HLD, COPD, Gout, OA, PUD, hypothyroidism, diverticulosis, anxiety/depression  seen for hospital follow up.   Admitted to [facility] 04/2022 as transferred from [facility] with chest pain. Initial plan for lexiscan, but developed EKG changes with TWI in LAD territory with recommendations to transfer to [facility]. hsTn 144>>150. Normal EF by echo at [facility]. Underwent cardiac cath showing severe proximal LAD stenosis treated with PCI/DES x1.  Mild to moderate nonobstructive disease in proximal left circumflex with widely patent large dominant RCA.  Recommendations for DAPT with aspirin/Plavix for at least 1 year. Scr stable at baseline 1.3-1.5. Recommended to cut back on alcohol.   Here today for follow from rehab.  Says he will finish rehab at [place] next Tuesday.  Says since he was out of hospital he started having right-sided chest pressure that started a few days ago.  Thinks it might be gas, happens about 3 times a day, last about 20 minutes in duration.  He said resting helps.  Breathing has improved since hospitalization.  Did say he is developed hives since the past week, and was itchy from the neck down, so this seems to be getting better.  Started prednisone and cream around 2 days ago, seems to be helping, however ankles appear more swollen.  Denies any issues of hematuria, melena, hemoptysis, or any acute bleeding.  Denies any palpitations, syncope, dizziness, orthopnea, PND, or claudication.  He has progressed to walking with just a straight cane.  Denies any other questions or concerns today.   Past Medical History:  Diagnosis Date    Acute gastrointestinal bleeding 03/28/2018   Acute on chronic combined systolic and diastolic CHF (congestive heart failure) ([place]) 03/28/2018   Alcohol abuse 03/28/2018   CKD (chronic kidney disease) stage 3, GFR 30-59 ml/min (HCC) 03/28/2018   Diverticular hemorrhage 03/28/2018   Hyperlipidemia 03/28/2018   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 and stage 3 chronic kidney disease ([place]) 03/28/2018   Left ventricular systolic dysfunction 18/56/3149   Segmental LAD distribution EF 45-50%   Prostate cancer ([place])    PTSD (post-traumatic stress disorder) 03/28/2018   TIA (transient ischemic attack) 03/28/2018    Past Surgical History:  Procedure Laterality Date   APPENDECTOMY     CORONARY STENT INTERVENTION N/A 05/16/2022   Procedure: CORONARY STENT INTERVENTION;  Surgeon: Sherren Mocha, MD;  Location: [place] [facility];  Service: Cardiovascular;  Laterality: N/A;   INGUINAL HERNIA REPAIR     INSERTION PROSTATE RADIATION SEED     LEFT HEART CATH AND CORONARY ANGIOGRAPHY N/A 05/16/2022   Procedure: LEFT HEART CATH AND CORONARY ANGIOGRAPHY;  Surgeon: Sherren Mocha, MD;  Location: [place] [facility];  Service: Cardiovascular;  Laterality: N/A;   STOMACH SURGERY     TONSILLECTOMY      Current Medications: Current Meds  Medication Sig   acetaminophen (TYLENOL) 500 MG tablet Take 1 tablet by mouth 4 (four) times daily as needed for moderate pain or headache.   allopurinol (ZYLOPRIM) 300 MG tablet Take 150 mg by mouth daily.   ammonium lactate (LAC-HYDRIN)  12 % lotion Apply topically. Apply topically.   aspirin EC 81 MG tablet Take 1 tablet (81 mg total) by mouth daily. Swallow whole.   atorvastatin (LIPITOR) 40 MG tablet Take 1 tablet (40 mg total) by mouth daily.   capsaicin (ZOSTRIX) 0.025 % cream Apply 1 Application topically 2 (two) times daily. Apply thin layer to affected area twice a day for neuropathy in feet.   Carboxymethylcellulose  Sod PF 0.25 % SOLN Apply 1 drop to eye 4 (four) times daily as needed (dry eyes).   Cholecalciferol (VITAMIN D) 2000 units tablet Take 2,000 Units by mouth daily.   citalopram (CELEXA) 40 MG tablet Take 20 mg by mouth daily.   clopidogrel (PLAVIX) 75 MG tablet Take 75 mg by mouth daily.   ferrous sulfate 325 (65 FE) MG tablet Take 325 mg by mouth 2 (two) times daily after a meal.   fluticasone (FLONASE) 50 MCG/ACT nasal spray Place 1 spray into both nostrils 2 (two) times daily.   folic acid (FOLVITE) 1 MG tablet Take 1 mg by mouth daily.   furosemide (LASIX) 40 MG tablet Take 0.5 tablets (20 mg total) by mouth daily.   gabapentin (NEURONTIN) 300 MG capsule Take 300 mg by mouth 3 (three) times daily.   isosorbide mononitrate (IMDUR) 30 MG 24 hr tablet Take 1 tablet (30 mg total) by mouth daily.   ketotifen (ZADITOR) 0.025 % ophthalmic solution Place 1 drop into both eyes 2 (two) times daily.   levothyroxine (SYNTHROID) 50 MCG tablet Take 50 mcg by mouth daily before breakfast.   loratadine (CLARITIN) 10 MG tablet Take 10 mg by mouth daily as needed for allergies.   Magnesium Oxide 420 MG TABS Take 420 mg by mouth 2 (two) times daily after a meal.   Multiple Vitamin (MULTIVITAMIN) tablet Take 1 tablet by mouth daily.   nitroGLYCERIN (NITROSTAT) 0.4 MG SL tablet Place 1 tablet (0.4 mg total) under the tongue every 5 (five) minutes x 3 doses as needed for chest pain.   pantoprazole (PROTONIX) 40 MG tablet Take 40 mg by mouth daily.   propranolol ER (INDERAL LA) 60 MG 24 hr capsule Take 1 capsule (60 mg total) by mouth daily.   QUEtiapine (SEROQUEL) 25 MG tablet Take 1 tablet (25 mg total) by mouth at bedtime.   THERATEARS 0.25 % SOLN Apply to eye. Apply to eye.   traZODone (DESYREL) 100 MG tablet Take 100 mg by mouth at bedtime.   triamcinolone cream (KENALOG) 0.1 % Apply 1 Application topically 2 (two) times daily as needed (rash).   vitamin B-12 (CYANOCOBALAMIN) 500 MCG tablet Take 1,000 mcg by  mouth daily.     Allergies:   Aspirin and Hydrocodone   Social History   Socioeconomic History   Marital status: Married    Spouse name: Not on file   Number of children: Not on file   Years of education: Not on file   Highest education level: Not on file  Occupational History   Not on file  Tobacco Use   Smoking status: Former    Packs/day: 1.00    Years: 30.00    Total pack years: 30.00    Types: Cigarettes    Quit date: 38    Years since quitting: 35.8   Smokeless tobacco: Never  Vaping Use   Vaping Use: Never used  Substance and Sexual Activity   Alcohol use: Yes    Alcohol/week: 4.0 - 6.0 standard drinks of alcohol    Types: 2 -  3 Cans of beer, 2 - 3 Shots of liquor per week    Comment: 2-3 day beers or vodka   Drug use: Not Currently   Sexual activity: Not on file  Other Topics Concern   Not on file  Social History Narrative   Not on file   Social Determinants of Health   Financial Resource Strain: Not on file  Food Insecurity: No Food Insecurity (05/15/2022)   Hunger Vital Sign    Worried About Running Out of Food in the Last Year: Never true    Ran Out of Food in the Last Year: Never true  Transportation Needs: No Transportation Needs (05/15/2022)   PRAPARE - Hydrologist (Medical): No    Lack of Transportation (Non-Medical): No  Physical Activity: Not on file  Stress: Not on file  Social Connections: Not on file     Family History: The patient's family history includes COPD in his father; Heart attack in his father; Heart disease in his father; Hypertension in his maternal grandmother and paternal grandmother.    ROS:    Review of Systems  Constitutional: Negative.   HENT: Negative.    Eyes: Negative.   Respiratory: Negative.    Cardiovascular:  Positive for chest pain and leg swelling. Negative for palpitations, orthopnea, claudication and PND.       See HPI.   Gastrointestinal: Negative.   Genitourinary:  Negative.   Musculoskeletal: Negative.   Skin:  Positive for itching and rash.       Hives, see HPI.  Neurological:  Positive for weakness. Negative for dizziness, tingling, tremors, sensory change, speech change, focal weakness, seizures, loss of consciousness and headaches.       Improving, See HPI.   Endo/Heme/Allergies: Negative.   Psychiatric/Behavioral: Negative.      Please see the history of present illness.    All other systems reviewed and are negative.   EKGs/Labs/Other Studies Reviewed:    The following studies were reviewed today:  Echo 05/11/22 at [facility] LVEF: 55-60% and mild MR  Cath: 05/16/22 1.  Severe proximal LAD stenosis treated with a 3.5 x 16 mm Synergy DES, postdilated to high-pressure with a 3.75 mm Mak[place]ilo balloon 2.  Mild to moderate nonobstructive proximal left circumflex stenosis 3.  Widely patent, large dominant RCA with no stenosis 4.  Low LVEDP   Recommendations: Continue dual antiplatelet therapy with aspirin and clopidogrel at least 12 months without interruption (ACS class I guideline).  Aggressive risk reduction measures.  As long as no complications arise, patient could be discharged from the hospital tomorrow.   Diagnostic   Dominance: Right                                                             Intervention       EKG:  EKG is not ordered today.    Recent Labs: 07/21/2021: ALT 23; TSH 2.519 07/22/2021: B Natriuretic Peptide 700.1 05/16/2022: Magnesium 1.8 06/02/2022: BUN 27; Creatinine, Ser 1.37; Hemoglobin 12.9; Platelets 243; Potassium 4.0; Sodium 144  Recent Lipid Panel    Component Value Date/Time   CHOL 164 05/16/2022 0217   TRIG 78 05/16/2022 0217   HDL 52 05/16/2022 0217   CHOLHDL 3.2 05/16/2022 0217   VLDL 16 05/16/2022 0217  [place] 96 05/16/2022 0217     Risk Assessment/Calculations:     Stop Bang Score today is 5.   Physical Exam:    VS:  BP 116/62   Pulse 72   Ht 5' 9"  (1.753 m)   Wt 189 lb 9.6 oz (86  kg)   SpO2 95%   BMI 28.00 kg/m     Wt Readings from Last 3 Encounters:  06/02/22 189 lb 9.6 oz (86 kg)  05/15/22 195 lb 5.2 oz (88.6 kg)  07/25/21 186 lb 4.6 oz (84.5 kg)     GEN: Well nourished, well developed in no acute distress HEENT: HOH, overall normal NECK: No JVD; No carotid bruits CARDIAC: S1/S2, RRR, no murmurs, rubs, gallops; 2+ peripheral pulses throughout, strong and equal bilaterally RESPIRATORY:  Clear and diminished to auscultation without rales, wheezing or rhonchi  MUSCULOSKELETAL: Focal, nonpitting edema along bilateral ankles, otherwise normal; No deformity  SKIN: Generalized, healing urticaria/hives along anterior and posterior torso, bilateral arms, and bilateral legs, warm and dry; right radial site is clean, dry, intact without any signs of hematoma, pseudoaneurysm, erythema, or any sign of infection. NEUROLOGIC:  Alert and oriented x 3 PSYCHIATRIC:  Normal affect   ASSESSMENT AND PLAN:    CAD, hx of NSTEMI, s/p DES to pLAD Recent cardiac cath revealed mild to moderate nonobstructive disease in pLCx with widely patent large dominant RCA, drug-eluting stent placed to proximal LAD due to severe pLAD stenosis.  Continue DAPT with aspirin and Plavix for at least 12 months without interruption.  Has had recent atypical right-sided chest pain that is different in presentation from his episode of angina previously.  Sounds most likely GI related or MSK related.  We will initiate amlodipine 5 mg daily and add to regimen.  No room to uptitrate Imdur due to low normal SBP.  Continue Tylenol as needed, as well as aspirin, Lipitor, Plavix, Imdur, nitroglycerin as needed, and propanolol. Heart healthy diet and regular cardiovascular exercise as tolerated encouraged.  ED precautions discussed.  Obtain CBC and BMET today.  2. HTN BP stable today.  BP overall well controlled at [employer].  Denies any issues with this.  BP log given today. Heart healthy diet and regular  cardiovascular exercise encouraged.  Continue current medication regimen.  Obtain BMET today.  3. HLD LDL 96, total cholesterol 164.  Started on Lipitor several weeks ago.  Continue medication regimen. Will obtain FLP and LFT in approximately 6 weeks. Heart healthy diet and regular cardiovascular exercise encouraged.   4. HFpEF Echo in 2022 revealed EF 70 to 75%, grade 1 DD, 60 to 65% EF in June.  EF 55 to 60% this admission at [facility].  Euvolemic and well compensated on exam.  However, has focal, nonpitting, dependent edema along bilateral ankles.  Discussed leg elevation, salt reduction to less than 2 g/day, and wearing compression stockings to help.  Continue current medication regimen. Low sodium diet, fluid restriction <2L, and daily weights encouraged. Educated to contact our office for weight gain of 2 lbs overnight or 5 lbs in one week. Heart healthy diet and regular cardiovascular exercise as tolerated encouraged.   5. CKD stage 3a Most recent serum creatinine 1.35, eGFR 54.  This seems to be around his baseline which appears to be 1.3-1.5.  We will obtain BMET today. Continue to follow with PCP.  6. Daytime sleepiness, snoring Stop bang score 5 today.  High risk for OSA.  He is agreeable to be set up with sleep study.  This will be arranged for him and plans to be done after he leaves [place] on 10/17.   7. Urticaria/Hives Etiology remains uncertain.  However, I highly doubt that this is due to his recent cardiology medications started at discharge as he stated this started around 1 week ago.  Either this is due to a medication recently started at [place], or this is most likely contact dermatitis or something he ate. The staff at [facility] is aware of this and he is currently taking prednisone as well as some creams are helping alleviate this.  It does appear that this is healing.  Primary care provider at [facility] health to manage.  8. Disposition: Follow up with APP in  3-4 weeks or sooner if anything changes.    Medication Adjustments/Labs and Tests Ordered: Current medicines are reviewed at length with the patient today.  Concerns regarding medicines are outlined above.  Orders Placed This Encounter  Procedures   Basic Metabolic Panel (BMET)   CBC   Lipid panel   Hepatic function panel   [facility] Sleep Study   Meds ordered this encounter  Medications   DISCONTD: amLODipine (NORVASC) 5 MG tablet    Sig: Take 1 tablet (5 mg total) by mouth daily.    Dispense:  30 tablet    Refill:  3   DISCONTD: amLODipine (NORVASC) 5 MG tablet    Sig: Take 1 tablet (5 mg total) by mouth daily.    Dispense:  30 tablet    Refill:  2   amLODipine (NORVASC) 5 MG tablet    Sig: Take 1 tablet (5 mg total) by mouth daily.    Dispense:  30 tablet    Refill:  0    Patient Instructions  Medication Instructions:  START Norvasc 74m Take 1 tablet one a day  *If you need a refill on your cardiac medications before your next appointment, please call your pharmacy*   Lab Work: TODAY-BMET, CBC 6 WEEKS-LIPIDS, LFT If you have labs (blood work) drawn today and your tests are completely normal, you will receive your results only by: [place](if you have MyChart) OR A paper copy in the mail If you have any lab test that is abnormal or we need to change your treatment, we will call you to review the results.   Testing/Procedures: Your physician has recommended that you have a sleep study. This test records several body functions during sleep, including: brain activity, eye movement, oxygen and carbon dioxide blood levels, heart rate and rhythm, breathing rate and rhythm, the flow of air through your mouth and nose, snoring, body muscle movements, and chest and belly movement.    Follow-Up: [facility] and your [facility] needs are our priority.  As part of our continuing mission to provide you with exceptional heart care, we have created designated  Provider Care Teams.  These Care Teams include your primary Cardiologist (physician) and Advanced Practice Providers (APPs -  Physician Assistants and Nurse Practitioners) who all work together to provide you with the care you need, when you need it.  We recommend signing up for the patient portal called "MyChart".  Sign up information is provided on this After Visit Summary.  MyChart is used to connect with patients for Virtual Visits (Telemedicine).  Patients are able to view lab/test results, encounter notes, upcoming appointments, etc.  Non-urgent messages can be sent to your provider as well.   To learn more about what you can do  with MyChart, go to [email or web address].    Your next appointment:   3-4 week(s)  The format for your next appointment:   In Person  Provider:   ANY APP     Other Instructions Blood pressure log given [facility] 6 handout  Important Information About Sugar         Signed, Finis Bud, NP  06/03/2022 11:01 AM    [place]

## 2022-06-02 NOTE — Patient Instructions (Addendum)
Medication Instructions:  START Norvasc '5mg'$  Take 1 tablet one a day  *If you need a refill on your cardiac medications before your next appointment, please call your pharmacy*   Lab Work: TODAY-BMET, CBC 6 WEEKS-LIPIDS, LFT If you have labs (blood work) drawn today and your tests are completely normal, you will receive your results only by: [place] (if you have MyChart) OR A paper copy in the mail If you have any lab test that is abnormal or we need to change your treatment, we will call you to review the results.   Testing/Procedures: Your physician has recommended that you have a sleep study. This test records several body functions during sleep, including: brain activity, eye movement, oxygen and carbon dioxide blood levels, heart rate and rhythm, breathing rate and rhythm, the flow of air through your mouth and nose, snoring, body muscle movements, and chest and belly movement.    Follow-Up: At [facility], you and your health needs are our priority.  As part of our continuing mission to provide you with exceptional heart care, we have created designated Provider Care Teams.  These Care Teams include your primary Cardiologist (physician) and Advanced Practice Providers (APPs -  Physician Assistants and Nurse Practitioners) who all work together to provide you with the care you need, when you need it.  We recommend signing up for the patient portal called "MyChart".  Sign up information is provided on this After Visit Summary.  MyChart is used to connect with patients for Virtual Visits (Telemedicine).  Patients are able to view lab/test results, encounter notes, upcoming appointments, etc.  Non-urgent messages can be sent to your provider as well.   To learn more about what you can do with MyChart, go to [email or web address].    Your next appointment:   3-4 week(s)  The format for your next appointment:   In Person  Provider:   ANY APP     Other  Instructions Blood pressure log given [facility] 6 handout  Important Information About Sugar

## 2022-06-03 LAB — CBC
Hematocrit: 39.6 % (ref 37.5–51.0)
Hemoglobin: 12.9 g/dL — ABNORMAL LOW (ref 13.0–17.7)
MCH: 30.4 pg (ref 26.6–33.0)
MCHC: 32.6 g/dL (ref 31.5–35.7)
MCV: 93 fL (ref 79–97)
Platelets: 243 10*3/uL (ref 150–450)
RBC: 4.25 x10E6/uL (ref 4.14–5.80)
RDW: 13.5 % (ref 11.6–15.4)
WBC: 10.5 10*3/uL (ref 3.4–10.8)

## 2022-06-03 LAB — BASIC METABOLIC PANEL
BUN/Creatinine Ratio: 20 (ref 10–24)
BUN: 27 mg/dL (ref 8–27)
CO2: 24 mmol/L (ref 20–29)
Calcium: 9.7 mg/dL (ref 8.6–10.2)
Chloride: 104 mmol/L (ref 96–106)
Creatinine, Ser: 1.37 mg/dL — ABNORMAL HIGH (ref 0.76–1.27)
Glucose: 109 mg/dL — ABNORMAL HIGH (ref 70–99)
Potassium: 4 mmol/L (ref 3.5–5.2)
Sodium: 144 mmol/L (ref 134–144)
eGFR: 53 mL/min/{1.73_m2} — ABNORMAL LOW (ref >59)

## 2022-06-07 ENCOUNTER — Telehealth: Payer: Self-pay

## 2022-06-07 ENCOUNTER — Telehealth: Payer: Self-pay | Admitting: *Deleted

## 2022-06-07 DIAGNOSIS — I214 Non-ST elevation (NSTEMI) myocardial infarction: Secondary | ICD-10-CM | POA: Diagnosis not present

## 2022-06-07 DIAGNOSIS — I5030 Unspecified diastolic (congestive) heart failure: Secondary | ICD-10-CM | POA: Diagnosis not present

## 2022-06-07 DIAGNOSIS — R21 Rash and other nonspecific skin eruption: Secondary | ICD-10-CM | POA: Diagnosis not present

## 2022-06-07 DIAGNOSIS — I10 Essential (primary) hypertension: Secondary | ICD-10-CM | POA: Diagnosis not present

## 2022-06-07 NOTE — Telephone Encounter (Signed)
Pt called, was unavailable, spoke to spouse Ms. Betty Nall.   Ms. Ashok Cordia told overall lab results stable per Finis Bud, NP.  BMET and CBC explained to Ms. Nall.  Ms. Ashok Cordia advised to have husband continue current treatment plan.    Spouse told to have Pt contact us if he has any questions.

## 2022-06-07 NOTE — Telephone Encounter (Signed)
Prior Authorization for ITAMAR sent to MEDICARE via Fax NO PA REQ 

## 2022-06-07 NOTE — Telephone Encounter (Signed)
-----   Message from Finis Bud, NP sent at 06/03/2022  3:13 PM EDT ----- Overall stable lab results. Will continue current treatment plan as discussed.   Thanks!   Finis Bud, AGNP-C

## 2022-06-08 DIAGNOSIS — G4700 Insomnia, unspecified: Secondary | ICD-10-CM | POA: Diagnosis not present

## 2022-06-08 DIAGNOSIS — E538 Deficiency of other specified B group vitamins: Secondary | ICD-10-CM | POA: Diagnosis not present

## 2022-06-08 DIAGNOSIS — K279 Peptic ulcer, site unspecified, unspecified as acute or chronic, without hemorrhage or perforation: Secondary | ICD-10-CM | POA: Diagnosis not present

## 2022-06-08 DIAGNOSIS — Z87891 Personal history of nicotine dependence: Secondary | ICD-10-CM | POA: Diagnosis not present

## 2022-06-08 DIAGNOSIS — I5030 Unspecified diastolic (congestive) heart failure: Secondary | ICD-10-CM | POA: Diagnosis not present

## 2022-06-08 DIAGNOSIS — D631 Anemia in chronic kidney disease: Secondary | ICD-10-CM | POA: Diagnosis not present

## 2022-06-08 DIAGNOSIS — I252 Old myocardial infarction: Secondary | ICD-10-CM | POA: Diagnosis not present

## 2022-06-08 DIAGNOSIS — M1990 Unspecified osteoarthritis, unspecified site: Secondary | ICD-10-CM | POA: Diagnosis not present

## 2022-06-08 DIAGNOSIS — K5790 Diverticulosis of intestine, part unspecified, without perforation or abscess without bleeding: Secondary | ICD-10-CM | POA: Diagnosis not present

## 2022-06-08 DIAGNOSIS — M1030 Gout due to renal impairment, unspecified site: Secondary | ICD-10-CM | POA: Diagnosis not present

## 2022-06-08 DIAGNOSIS — N189 Chronic kidney disease, unspecified: Secondary | ICD-10-CM | POA: Diagnosis not present

## 2022-06-08 DIAGNOSIS — Z7902 Long term (current) use of antithrombotics/antiplatelets: Secondary | ICD-10-CM | POA: Diagnosis not present

## 2022-06-08 DIAGNOSIS — J449 Chronic obstructive pulmonary disease, unspecified: Secondary | ICD-10-CM | POA: Diagnosis not present

## 2022-06-08 DIAGNOSIS — E039 Hypothyroidism, unspecified: Secondary | ICD-10-CM | POA: Diagnosis not present

## 2022-06-08 DIAGNOSIS — F419 Anxiety disorder, unspecified: Secondary | ICD-10-CM | POA: Diagnosis not present

## 2022-06-08 DIAGNOSIS — Z7982 Long term (current) use of aspirin: Secondary | ICD-10-CM | POA: Diagnosis not present

## 2022-06-08 DIAGNOSIS — I130 Hypertensive heart and chronic kidney disease with heart failure and stage 1 through stage 4 chronic kidney disease, or unspecified chronic kidney disease: Secondary | ICD-10-CM | POA: Diagnosis not present

## 2022-06-08 DIAGNOSIS — E559 Vitamin D deficiency, unspecified: Secondary | ICD-10-CM | POA: Diagnosis not present

## 2022-06-08 DIAGNOSIS — Z9181 History of falling: Secondary | ICD-10-CM | POA: Diagnosis not present

## 2022-06-08 DIAGNOSIS — F329 Major depressive disorder, single episode, unspecified: Secondary | ICD-10-CM | POA: Diagnosis not present

## 2022-06-08 DIAGNOSIS — I493 Ventricular premature depolarization: Secondary | ICD-10-CM | POA: Diagnosis not present

## 2022-06-08 DIAGNOSIS — N400 Enlarged prostate without lower urinary tract symptoms: Secondary | ICD-10-CM | POA: Diagnosis not present

## 2022-06-08 DIAGNOSIS — Z7951 Long term (current) use of inhaled steroids: Secondary | ICD-10-CM | POA: Diagnosis not present

## 2022-06-08 DIAGNOSIS — R7303 Prediabetes: Secondary | ICD-10-CM | POA: Diagnosis not present

## 2022-06-08 DIAGNOSIS — E785 Hyperlipidemia, unspecified: Secondary | ICD-10-CM | POA: Diagnosis not present

## 2022-06-08 NOTE — Telephone Encounter (Signed)
I got a message the pt needed a sleep study. I called the pt to schedule a time for him to come by the office for set up with It[facility]. Pt said he thinks he is going to hold off for now on sleep study. Pt has agreed when he comes in 06/23/22 for his f/u appt he will let us know if he decides to proceed with sleep study. If so the pt will then need to be set up with Itamar study. Pt has been approved already, so he can be given the PIN # [id number] if decides to proceed. Pt thanked me for the call today.

## 2022-06-09 DIAGNOSIS — N189 Chronic kidney disease, unspecified: Secondary | ICD-10-CM | POA: Diagnosis not present

## 2022-06-09 DIAGNOSIS — M1030 Gout due to renal impairment, unspecified site: Secondary | ICD-10-CM | POA: Diagnosis not present

## 2022-06-09 DIAGNOSIS — I252 Old myocardial infarction: Secondary | ICD-10-CM | POA: Diagnosis not present

## 2022-06-09 DIAGNOSIS — D631 Anemia in chronic kidney disease: Secondary | ICD-10-CM | POA: Diagnosis not present

## 2022-06-09 DIAGNOSIS — I130 Hypertensive heart and chronic kidney disease with heart failure and stage 1 through stage 4 chronic kidney disease, or unspecified chronic kidney disease: Secondary | ICD-10-CM | POA: Diagnosis not present

## 2022-06-09 DIAGNOSIS — I5030 Unspecified diastolic (congestive) heart failure: Secondary | ICD-10-CM | POA: Diagnosis not present

## 2022-06-22 NOTE — Progress Notes (Unsigned)
Cardiology Office Note:    Date:  06/23/2022   ID:  Andre, Ross 23-Mar-1946, MRN [id number]  PCP:  Nicoletta Dress, MD  [facility] Cardiologist:  Shirlee More, MD  [facility] Electrophysiologist:  None   Chief Complaint: 3-4 weeks follow up   History of Present Illness:    Andre Ross is a 76 y.o. male with a hx of CAD s/p DES to pLAD 04/2022, HFpEF, PVC, HLD, HTN, CVA, CKD IIIa, COPD, PUD, hypothyroidism, diverticulosis, anxiety/depression seen for follow up.   Admitted 04/2022 as transfer from [facility] for chest pain/non-STEMI.  Cardiac cath showed severe proximal LAD stenosis status post PCI with DES placement.  Frequent PVC on monitor.  Echocardiogram with LV function of 55 to 60%.  Recommended to cut back on alcohol.  Seen 06/02/2022 for hospital follow-up.  Some atypical chest pain while at rehab.  Added amlodipine to her regimen.  Had some urticaria/hives.  Improving on prednisone.  Here today for follow-up.  He denies chest pain, shortness of breath, orthopnea, PND, syncope, lower extremity edema or melena.  His rash is improving.  Past Medical History:  Diagnosis Date   Acute gastrointestinal bleeding 03/28/2018   Acute on chronic combined systolic and diastolic CHF (congestive heart failure) ([place]) 03/28/2018   Alcohol abuse 03/28/2018   CKD (chronic kidney disease) stage 3, GFR 30-59 ml/min (HCC) 03/28/2018   Diverticular hemorrhage 03/28/2018   Hyperlipidemia 03/28/2018   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 and stage 3 chronic kidney disease ([place]) 03/28/2018   Left ventricular systolic dysfunction 73/53/2992   Segmental LAD distribution EF 45-50%   Prostate cancer ([place])    PTSD (post-traumatic stress disorder) 03/28/2018   TIA (transient ischemic attack) 03/28/2018    Past Surgical History:  Procedure Laterality Date   APPENDECTOMY     CORONARY STENT INTERVENTION N/A 05/16/2022   Procedure:  CORONARY STENT INTERVENTION;  Surgeon: Sherren Mocha, MD;  Location: [place] [facility];  Service: Cardiovascular;  Laterality: N/A;   INGUINAL HERNIA REPAIR     INSERTION PROSTATE RADIATION SEED     LEFT HEART CATH AND CORONARY ANGIOGRAPHY N/A 05/16/2022   Procedure: LEFT HEART CATH AND CORONARY ANGIOGRAPHY;  Surgeon: Sherren Mocha, MD;  Location: [place] [facility];  Service: Cardiovascular;  Laterality: N/A;   STOMACH SURGERY     TONSILLECTOMY      Current Medications: Current Meds  Medication Sig   acetaminophen (TYLENOL) 500 MG tablet Take 1 tablet by mouth 4 (four) times daily as needed for moderate pain or headache.   allopurinol (ZYLOPRIM) 300 MG tablet Take 150 mg by mouth daily.   amLODipine (NORVASC) 5 MG tablet Take 1 tablet (5 mg total) by mouth daily.   ammonium lactate (LAC-HYDRIN) 12 % lotion Apply topically. Apply topically.   aspirin EC 81 MG tablet Take 1 tablet (81 mg total) by mouth daily. Swallow whole.   atorvastatin (LIPITOR) 40 MG tablet Take 1 tablet (40 mg total) by mouth daily.   capsaicin (ZOSTRIX) 0.025 % cream Apply 1 Application topically 2 (two) times daily. Apply thin layer to affected area twice a day for neuropathy in feet.   Carboxymethylcellulose Sod PF 0.25 % SOLN Apply 1 drop to eye 4 (four) times daily as needed (dry eyes).   Cholecalciferol (VITAMIN D) 2000 units tablet Take 2,000 Units by mouth daily.   citalopram (CELEXA) 40 MG tablet Take 20 mg by mouth daily.   clopidogrel (PLAVIX) 75  MG tablet Take 75 mg by mouth daily.   ferrous sulfate 325 (65 FE) MG tablet Take 325 mg by mouth 2 (two) times daily after a meal.   fluticasone (FLONASE) 50 MCG/ACT nasal spray Place 1 spray into both nostrils 2 (two) times daily.   folic acid (FOLVITE) 1 MG tablet Take 1 mg by mouth daily.   furosemide (LASIX) 40 MG tablet Take 0.5 tablets (20 mg total) by mouth daily.   gabapentin (NEURONTIN) 300 MG capsule Take 300 mg by mouth 3 (three) times daily.    isosorbide mononitrate (IMDUR) 30 MG 24 hr tablet Take 1 tablet (30 mg total) by mouth daily.   ketotifen (ZADITOR) 0.025 % ophthalmic solution Place 1 drop into both eyes 2 (two) times daily.   levothyroxine (SYNTHROID) 50 MCG tablet Take 50 mcg by mouth daily before breakfast.   loratadine (CLARITIN) 10 MG tablet Take 10 mg by mouth daily as needed for allergies.   Magnesium Oxide 420 MG TABS Take 420 mg by mouth 2 (two) times daily after a meal.   Multiple Vitamin (MULTIVITAMIN) tablet Take 1 tablet by mouth daily.   pantoprazole (PROTONIX) 40 MG tablet Take 40 mg by mouth daily.   propranolol ER (INDERAL LA) 60 MG 24 hr capsule Take 1 capsule (60 mg total) by mouth daily.   QUEtiapine (SEROQUEL) 25 MG tablet Take 1 tablet (25 mg total) by mouth at bedtime.   THERATEARS 0.25 % SOLN Apply to eye. Apply to eye.   traZODone (DESYREL) 100 MG tablet Take 100 mg by mouth at bedtime.   triamcinolone cream (KENALOG) 0.1 % Apply 1 Application topically 2 (two) times daily as needed (rash).   vitamin B-12 (CYANOCOBALAMIN) 500 MCG tablet Take 1,000 mcg by mouth daily.   [DISCONTINUED] nitroGLYCERIN (NITROSTAT) 0.4 MG SL tablet Place 1 tablet (0.4 mg total) under the tongue every 5 (five) minutes x 3 doses as needed for chest pain.     Allergies:   Hydrocodone   Social History   Socioeconomic History   Marital status: Married    Spouse name: Not on file   Number of children: Not on file   Years of education: Not on file   Highest education level: Not on file  Occupational History   Not on file  Tobacco Use   Smoking status: Former    Packs/day: 1.00    Years: 30.00    Total pack years: 30.00    Types: Cigarettes    Quit date: 57    Years since quitting: 35.8   Smokeless tobacco: Never  Vaping Use   Vaping Use: Never used  Substance and Sexual Activity   Alcohol use: Yes    Alcohol/week: 4.0 - 6.0 standard drinks of alcohol    Types: 2 - 3 Cans of beer, 2 - 3 Shots of liquor per  week    Comment: 2-3 day beers or vodka   Drug use: Not Currently   Sexual activity: Not on file  Other Topics Concern   Not on file  Social History Narrative   Not on file   Social Determinants of Health   Financial Resource Strain: Not on file  Food Insecurity: No Food Insecurity (05/15/2022)   Hunger Vital Sign    Worried About Running Out of Food in the Last Year: Never true    Ran Out of Food in the Last Year: Never true  Transportation Needs: No Transportation Needs (05/15/2022)   PRAPARE - Transportation    Lack  of Transportation (Medical): No    Lack of Transportation (Non-Medical): No  Physical Activity: Not on file  Stress: Not on file  Social Connections: Not on file     Family History: The patient's family history includes COPD in his father; Heart attack in his father; Heart disease in his father; Hypertension in his maternal grandmother and paternal grandmother.    ROS:   Please see the history of present illness.    All other systems reviewed and are negative.   EKGs/Labs/Other Studies Reviewed:    The following studies were reviewed today:  Echo 05/11/22 at [facility] LVEF: 55-60% and mild MR   Cath: 05/16/22 1.  Severe proximal LAD stenosis treated with a 3.5 x 16 mm Synergy DES, postdilated to high-pressure with a 3.75 mm [place] balloon 2.  Mild to moderate nonobstructive proximal left circumflex stenosis 3.  Widely patent, large dominant RCA with no stenosis 4.  Low LVEDP   Recommendations: Continue dual antiplatelet therapy with aspirin and clopidogrel at least 12 months without interruption (ACS class I guideline).  Aggressive risk reduction measures.  As long as no complications arise, patient could be discharged from the hospital tomorrow.   Diagnostic   Dominance: Right                                                             Intervention         EKG:  EKG is not  ordered today.   Recent Labs: 07/21/2021: ALT 23; TSH 2.519 07/22/2021: B  Natriuretic Peptide 700.1 05/16/2022: Magnesium 1.8 06/02/2022: BUN 27; Creatinine, Ser 1.37; Hemoglobin 12.9; Platelets 243; Potassium 4.0; Sodium 144  Recent Lipid Panel    Component Value Date/Time   CHOL 164 05/16/2022 0217   TRIG 78 05/16/2022 0217   HDL 52 05/16/2022 0217   CHOLHDL 3.2 05/16/2022 0217   VLDL 16 05/16/2022 0217   LDLCALC 96 05/16/2022 0217     Physical Exam:    VS:  BP 130/68   Pulse 66   Ht '5\' 9"'$  (1.753 m)   Wt 180 lb 3.2 oz (81.7 kg)   SpO2 98%   BMI 26.61 kg/m     Wt Readings from Last 3 Encounters:  06/23/22 180 lb 3.2 oz (81.7 kg)  06/02/22 189 lb 9.6 oz (86 kg)  05/15/22 195 lb 5.2 oz (88.6 kg)     GEN:  Well nourished, well developed in no acute distress HEENT: Normal NECK: No JVD; No carotid bruits LYMPHATICS: No lymphadenopathy CARDIAC: RRR, no murmurs, rubs, gallops RESPIRATORY:  Clear to auscultation without rales, wheezing or rhonchi  ABDOMEN: Soft, non-tender, non-distended MUSCULOSKELETAL:  No edema; No deformity  SKIN: Warm and dry NEUROLOGIC:  Alert and oriented x 3 PSYCHIATRIC:  Normal affect   ASSESSMENT AND PLAN:    CAD s/p DES to proximal LAD No anginal type symptoms.  Some atypical chest pain.  Did not require to take sublingual nitroglycerin.  Continue dual antiplatelet therapy with aspirin and Plavix.  Continue statin, Imdur, beta-blocker and amlodipine.  2.  Hypertension Blood pressure stable and controlled  3.  Heart failure with preserved LV function -Euvolemic.  Continue Lasix  4.  Daytime sleepiness -Pending sleep study  Medication Adjustments/Labs and Tests Ordered: Current medicines are reviewed at length with the  patient today.  Concerns regarding medicines are outlined above.  No orders of the defined types were placed in this encounter.  Meds ordered this encounter  Medications   nitroGLYCERIN (NITROSTAT) 0.4 MG SL tablet    Sig: Place 1 tablet (0.4 mg total) under the tongue every 5 (five) minutes  x 3 doses as needed for chest pain.    Dispense:  25 tablet    Refill:  3    Patient Instructions  Medication Instructions:  Your physician recommends that you continue on your current medications as directed. Please refer to the Current Medication list given to you today.  *If you need a refill on your cardiac medications before your next appointment, please call your pharmacy*   Lab Work: None If you have labs (blood work) drawn today and your tests are completely normal, you will receive your results only by: [place] (if you have MyChart) OR A paper copy in the mail If you have any lab test that is abnormal or we need to change your treatment, we will call you to review the results.   Follow-Up: At [facility], you and your health needs are our priority.  As part of our continuing mission to provide you with exceptional heart care, we have created designated Provider Care Teams.  These Care Teams include your primary Cardiologist (physician) and Advanced Practice Providers (APPs -  Physician Assistants and Nurse Practitioners) who all work together to provide you with the care you need, when you need it.  We recommend signing up for the patient portal called "MyChart".  Sign up information is provided on this After Visit Summary.  MyChart is used to connect with patients for Virtual Visits (Telemedicine).  Patients are able to view lab/test results, encounter notes, upcoming appointments, etc.  Non-urgent messages can be sent to your provider as well.   To learn more about what you can do with MyChart, go to [email or web address].    Your next appointment:   2-3 month(s)  The format for your next appointment:   In Person  Provider:   Shirlee More, MD    Important Information About Sugar         Jarrett Soho, [place]  06/23/2022 11:42 AM    [place]

## 2022-06-23 ENCOUNTER — Ambulatory Visit: Payer: Medicare Other | Attending: Physician Assistant | Admitting: Physician Assistant

## 2022-06-23 VITALS — BP 130/68 | HR 66 | Ht 69.0 in | Wt 180.2 lb

## 2022-06-23 DIAGNOSIS — E782 Mixed hyperlipidemia: Secondary | ICD-10-CM

## 2022-06-23 DIAGNOSIS — I10 Essential (primary) hypertension: Secondary | ICD-10-CM

## 2022-06-23 DIAGNOSIS — I25118 Atherosclerotic heart disease of native coronary artery with other forms of angina pectoris: Secondary | ICD-10-CM | POA: Diagnosis not present

## 2022-06-23 DIAGNOSIS — I5032 Chronic diastolic (congestive) heart failure: Secondary | ICD-10-CM

## 2022-06-23 MED ORDER — NITROGLYCERIN 0.4 MG SL SUBL: 0.4000 mg | SUBLINGUAL_TABLET | SUBLINGUAL | 3 refills | Status: AC | PRN

## 2022-06-23 NOTE — Patient Instructions (Signed)
Medication Instructions:  Your physician recommends that you continue on your current medications as directed. Please refer to the Current Medication list given to you today.  *If you need a refill on your cardiac medications before your next appointment, please call your pharmacy*   Lab Work: None If you have labs (blood work) drawn today and your tests are completely normal, you will receive your results only by: [place] (if you have MyChart) OR A paper copy in the mail If you have any lab test that is abnormal or we need to change your treatment, we will call you to review the results.   Follow-Up: At [facility], you and your health needs are our priority.  As part of our continuing mission to provide you with exceptional heart care, we have created designated Provider Care Teams.  These Care Teams include your primary Cardiologist (physician) and Advanced Practice Providers (APPs -  Physician Assistants and Nurse Practitioners) who all work together to provide you with the care you need, when you need it.  We recommend signing up for the patient portal called "MyChart".  Sign up information is provided on this After Visit Summary.  MyChart is used to connect with patients for Virtual Visits (Telemedicine).  Patients are able to view lab/test results, encounter notes, upcoming appointments, etc.  Non-urgent messages can be sent to your provider as well.   To learn more about what you can do with MyChart, go to [email or web address].    Your next appointment:   2-3 month(s)  The format for your next appointment:   In Person  Provider:   Shirlee More, MD    Important Information About Sugar

## 2022-06-23 NOTE — Telephone Encounter (Signed)
Patient in the office today for a visit when he states to April G. That "He actually just told me he didn't want to do a sleep study right now." We will cancel his itamar test because he does not have a smart phone either.

## 2022-06-24 DIAGNOSIS — M1030 Gout due to renal impairment, unspecified site: Secondary | ICD-10-CM | POA: Diagnosis not present

## 2022-06-24 DIAGNOSIS — I130 Hypertensive heart and chronic kidney disease with heart failure and stage 1 through stage 4 chronic kidney disease, or unspecified chronic kidney disease: Secondary | ICD-10-CM | POA: Diagnosis not present

## 2022-06-24 DIAGNOSIS — N189 Chronic kidney disease, unspecified: Secondary | ICD-10-CM | POA: Diagnosis not present

## 2022-06-24 DIAGNOSIS — I5030 Unspecified diastolic (congestive) heart failure: Secondary | ICD-10-CM | POA: Diagnosis not present

## 2022-06-24 DIAGNOSIS — D631 Anemia in chronic kidney disease: Secondary | ICD-10-CM | POA: Diagnosis not present

## 2022-06-24 DIAGNOSIS — I252 Old myocardial infarction: Secondary | ICD-10-CM | POA: Diagnosis not present

## 2022-06-28 DIAGNOSIS — D631 Anemia in chronic kidney disease: Secondary | ICD-10-CM | POA: Diagnosis not present

## 2022-06-28 DIAGNOSIS — I5030 Unspecified diastolic (congestive) heart failure: Secondary | ICD-10-CM | POA: Diagnosis not present

## 2022-06-28 DIAGNOSIS — M1030 Gout due to renal impairment, unspecified site: Secondary | ICD-10-CM | POA: Diagnosis not present

## 2022-06-28 DIAGNOSIS — N189 Chronic kidney disease, unspecified: Secondary | ICD-10-CM | POA: Diagnosis not present

## 2022-06-28 DIAGNOSIS — I252 Old myocardial infarction: Secondary | ICD-10-CM | POA: Diagnosis not present

## 2022-06-28 DIAGNOSIS — I130 Hypertensive heart and chronic kidney disease with heart failure and stage 1 through stage 4 chronic kidney disease, or unspecified chronic kidney disease: Secondary | ICD-10-CM | POA: Diagnosis not present

## 2022-07-01 DIAGNOSIS — I130 Hypertensive heart and chronic kidney disease with heart failure and stage 1 through stage 4 chronic kidney disease, or unspecified chronic kidney disease: Secondary | ICD-10-CM | POA: Diagnosis not present

## 2022-07-01 DIAGNOSIS — I5030 Unspecified diastolic (congestive) heart failure: Secondary | ICD-10-CM | POA: Diagnosis not present

## 2022-07-01 DIAGNOSIS — M1030 Gout due to renal impairment, unspecified site: Secondary | ICD-10-CM | POA: Diagnosis not present

## 2022-07-01 DIAGNOSIS — N189 Chronic kidney disease, unspecified: Secondary | ICD-10-CM | POA: Diagnosis not present

## 2022-07-01 DIAGNOSIS — I252 Old myocardial infarction: Secondary | ICD-10-CM | POA: Diagnosis not present

## 2022-07-01 DIAGNOSIS — D631 Anemia in chronic kidney disease: Secondary | ICD-10-CM | POA: Diagnosis not present

## 2022-07-07 ENCOUNTER — Other Ambulatory Visit: Payer: Medicare Other

## 2022-07-08 DIAGNOSIS — I493 Ventricular premature depolarization: Secondary | ICD-10-CM | POA: Diagnosis not present

## 2022-07-08 DIAGNOSIS — E559 Vitamin D deficiency, unspecified: Secondary | ICD-10-CM | POA: Diagnosis not present

## 2022-07-08 DIAGNOSIS — E538 Deficiency of other specified B group vitamins: Secondary | ICD-10-CM | POA: Diagnosis not present

## 2022-07-08 DIAGNOSIS — J449 Chronic obstructive pulmonary disease, unspecified: Secondary | ICD-10-CM | POA: Diagnosis not present

## 2022-07-08 DIAGNOSIS — M1990 Unspecified osteoarthritis, unspecified site: Secondary | ICD-10-CM | POA: Diagnosis not present

## 2022-07-08 DIAGNOSIS — E785 Hyperlipidemia, unspecified: Secondary | ICD-10-CM | POA: Diagnosis not present

## 2022-07-08 DIAGNOSIS — Z7951 Long term (current) use of inhaled steroids: Secondary | ICD-10-CM | POA: Diagnosis not present

## 2022-07-08 DIAGNOSIS — I5030 Unspecified diastolic (congestive) heart failure: Secondary | ICD-10-CM | POA: Diagnosis not present

## 2022-07-08 DIAGNOSIS — I130 Hypertensive heart and chronic kidney disease with heart failure and stage 1 through stage 4 chronic kidney disease, or unspecified chronic kidney disease: Secondary | ICD-10-CM | POA: Diagnosis not present

## 2022-07-08 DIAGNOSIS — Z87891 Personal history of nicotine dependence: Secondary | ICD-10-CM | POA: Diagnosis not present

## 2022-07-08 DIAGNOSIS — G4700 Insomnia, unspecified: Secondary | ICD-10-CM | POA: Diagnosis not present

## 2022-07-08 DIAGNOSIS — F419 Anxiety disorder, unspecified: Secondary | ICD-10-CM | POA: Diagnosis not present

## 2022-07-08 DIAGNOSIS — K279 Peptic ulcer, site unspecified, unspecified as acute or chronic, without hemorrhage or perforation: Secondary | ICD-10-CM | POA: Diagnosis not present

## 2022-07-08 DIAGNOSIS — I252 Old myocardial infarction: Secondary | ICD-10-CM | POA: Diagnosis not present

## 2022-07-08 DIAGNOSIS — F329 Major depressive disorder, single episode, unspecified: Secondary | ICD-10-CM | POA: Diagnosis not present

## 2022-07-08 DIAGNOSIS — Z7982 Long term (current) use of aspirin: Secondary | ICD-10-CM | POA: Diagnosis not present

## 2022-07-08 DIAGNOSIS — N189 Chronic kidney disease, unspecified: Secondary | ICD-10-CM | POA: Diagnosis not present

## 2022-07-08 DIAGNOSIS — Z9181 History of falling: Secondary | ICD-10-CM | POA: Diagnosis not present

## 2022-07-08 DIAGNOSIS — E039 Hypothyroidism, unspecified: Secondary | ICD-10-CM | POA: Diagnosis not present

## 2022-07-08 DIAGNOSIS — D631 Anemia in chronic kidney disease: Secondary | ICD-10-CM | POA: Diagnosis not present

## 2022-07-08 DIAGNOSIS — K5790 Diverticulosis of intestine, part unspecified, without perforation or abscess without bleeding: Secondary | ICD-10-CM | POA: Diagnosis not present

## 2022-07-08 DIAGNOSIS — N400 Enlarged prostate without lower urinary tract symptoms: Secondary | ICD-10-CM | POA: Diagnosis not present

## 2022-07-08 DIAGNOSIS — R7303 Prediabetes: Secondary | ICD-10-CM | POA: Diagnosis not present

## 2022-07-08 DIAGNOSIS — Z7902 Long term (current) use of antithrombotics/antiplatelets: Secondary | ICD-10-CM | POA: Diagnosis not present

## 2022-07-08 DIAGNOSIS — M1030 Gout due to renal impairment, unspecified site: Secondary | ICD-10-CM | POA: Diagnosis not present

## 2022-07-12 DIAGNOSIS — I252 Old myocardial infarction: Secondary | ICD-10-CM | POA: Diagnosis not present

## 2022-07-12 DIAGNOSIS — M1030 Gout due to renal impairment, unspecified site: Secondary | ICD-10-CM | POA: Diagnosis not present

## 2022-07-12 DIAGNOSIS — I130 Hypertensive heart and chronic kidney disease with heart failure and stage 1 through stage 4 chronic kidney disease, or unspecified chronic kidney disease: Secondary | ICD-10-CM | POA: Diagnosis not present

## 2022-07-12 DIAGNOSIS — N189 Chronic kidney disease, unspecified: Secondary | ICD-10-CM | POA: Diagnosis not present

## 2022-07-12 DIAGNOSIS — I5030 Unspecified diastolic (congestive) heart failure: Secondary | ICD-10-CM | POA: Diagnosis not present

## 2022-07-12 DIAGNOSIS — D631 Anemia in chronic kidney disease: Secondary | ICD-10-CM | POA: Diagnosis not present

## 2022-07-19 DIAGNOSIS — I5030 Unspecified diastolic (congestive) heart failure: Secondary | ICD-10-CM | POA: Diagnosis not present

## 2022-07-19 DIAGNOSIS — D631 Anemia in chronic kidney disease: Secondary | ICD-10-CM | POA: Diagnosis not present

## 2022-07-19 DIAGNOSIS — I130 Hypertensive heart and chronic kidney disease with heart failure and stage 1 through stage 4 chronic kidney disease, or unspecified chronic kidney disease: Secondary | ICD-10-CM | POA: Diagnosis not present

## 2022-07-19 DIAGNOSIS — N189 Chronic kidney disease, unspecified: Secondary | ICD-10-CM | POA: Diagnosis not present

## 2022-07-19 DIAGNOSIS — M1030 Gout due to renal impairment, unspecified site: Secondary | ICD-10-CM | POA: Diagnosis not present

## 2022-07-19 DIAGNOSIS — I252 Old myocardial infarction: Secondary | ICD-10-CM | POA: Diagnosis not present

## 2022-07-20 DIAGNOSIS — D631 Anemia in chronic kidney disease: Secondary | ICD-10-CM | POA: Diagnosis not present

## 2022-07-20 DIAGNOSIS — I130 Hypertensive heart and chronic kidney disease with heart failure and stage 1 through stage 4 chronic kidney disease, or unspecified chronic kidney disease: Secondary | ICD-10-CM | POA: Diagnosis not present

## 2022-07-20 DIAGNOSIS — N189 Chronic kidney disease, unspecified: Secondary | ICD-10-CM | POA: Diagnosis not present

## 2022-07-20 DIAGNOSIS — I252 Old myocardial infarction: Secondary | ICD-10-CM | POA: Diagnosis not present

## 2022-07-20 DIAGNOSIS — I5030 Unspecified diastolic (congestive) heart failure: Secondary | ICD-10-CM | POA: Diagnosis not present

## 2022-07-20 DIAGNOSIS — M1030 Gout due to renal impairment, unspecified site: Secondary | ICD-10-CM | POA: Diagnosis not present

## 2022-07-27 DIAGNOSIS — I252 Old myocardial infarction: Secondary | ICD-10-CM | POA: Diagnosis not present

## 2022-07-27 DIAGNOSIS — I130 Hypertensive heart and chronic kidney disease with heart failure and stage 1 through stage 4 chronic kidney disease, or unspecified chronic kidney disease: Secondary | ICD-10-CM | POA: Diagnosis not present

## 2022-07-27 DIAGNOSIS — M1030 Gout due to renal impairment, unspecified site: Secondary | ICD-10-CM | POA: Diagnosis not present

## 2022-07-27 DIAGNOSIS — N189 Chronic kidney disease, unspecified: Secondary | ICD-10-CM | POA: Diagnosis not present

## 2022-07-27 DIAGNOSIS — I5030 Unspecified diastolic (congestive) heart failure: Secondary | ICD-10-CM | POA: Diagnosis not present

## 2022-07-27 DIAGNOSIS — D631 Anemia in chronic kidney disease: Secondary | ICD-10-CM | POA: Diagnosis not present

## 2022-07-28 DIAGNOSIS — I5030 Unspecified diastolic (congestive) heart failure: Secondary | ICD-10-CM | POA: Diagnosis not present

## 2022-07-28 DIAGNOSIS — D631 Anemia in chronic kidney disease: Secondary | ICD-10-CM | POA: Diagnosis not present

## 2022-07-28 DIAGNOSIS — I130 Hypertensive heart and chronic kidney disease with heart failure and stage 1 through stage 4 chronic kidney disease, or unspecified chronic kidney disease: Secondary | ICD-10-CM | POA: Diagnosis not present

## 2022-07-28 DIAGNOSIS — I252 Old myocardial infarction: Secondary | ICD-10-CM | POA: Diagnosis not present

## 2022-07-28 DIAGNOSIS — M1030 Gout due to renal impairment, unspecified site: Secondary | ICD-10-CM | POA: Diagnosis not present

## 2022-07-28 DIAGNOSIS — N189 Chronic kidney disease, unspecified: Secondary | ICD-10-CM | POA: Diagnosis not present

## 2022-08-18 ENCOUNTER — Telehealth: Payer: Self-pay

## 2022-08-18 NOTE — Telephone Encounter (Signed)
        Patient  visited [place] on 12/24   Telephone encounter attempt :  1st  A HIPAA compliant voice message was left requesting a return call.  Instructed patient to call back .    [place], Care Management  [phone] [address], [place], [place] [address] Phone: [phone] Email: [email or web address]

## 2022-08-19 ENCOUNTER — Telehealth: Payer: Self-pay

## 2022-08-19 NOTE — Telephone Encounter (Signed)
        Patient  visited [place] on 12/24    Telephone encounter attempt :  2nd  A HIPAA compliant voice message was left requesting a return call.  Instructed patient to call back   [place], [place] Management  [phone] [address], [place], [place] [address] Phone: [phone] Email: [email or web address]

## 2022-08-23 NOTE — Progress Notes (Deleted)
Cardiology Office Note:    Date:  08/23/2022   ID:  Artist, Bloom 07/06/1946, MRN [id number]  PCP:  Nicoletta Dress, MD  Cardiologist:  Shirlee More, MD    Referring MD: Nicoletta Dress, MD    ASSESSMENT:    No diagnosis found. PLAN:    In order of problems listed above:  ***   Next appointment: ***   Medication Adjustments/Labs and Tests Ordered: Current medicines are reviewed at length with the patient today.  Concerns regarding medicines are outlined above.  No orders of the defined types were placed in this encounter.  No orders of the defined types were placed in this encounter.   No chief complaint on file.   History of Present Illness:    Andre Ross is a 77 y.o. male with a hx of CAD with drug-eluting stent to the proximal LAD September 2023 heart failure frequent PVCs hypertension stage III CKD hyperlipidemia COPD and stroke last seen 06/23/2022.  He has been seen several times in the [place] office last seen by me in 2019.  At that time his problems include hypertensive heart and kidney disease with diastolic heart failure stage III CKD and alcohol abuse.Admitted 04/2022 as transfer from [facility] for chest pain/non-STEMI.  Cardiac cath showed severe proximal LAD stenosis status post PCI with DES placement.  Frequent PVC on monitor.    Echocardiogram with LV function of 55 to 60%.  Recommended to cut back on alcohol.   Seen 06/02/2022 for hospital follow-up.  Some atypical chest pain while at rehab.  Added amlodipine to her regimen.  Had some urticaria/hives.  Improving on prednisone. Compliance with diet, lifestyle and medications: *** Past Medical History:  Diagnosis Date   Acute gastrointestinal bleeding 03/28/2018   Acute on chronic combined systolic and diastolic CHF (congestive heart failure) ([place]) 03/28/2018   Alcohol abuse 03/28/2018   CKD (chronic kidney disease) stage 3, GFR 30-59 ml/min (HCC) 03/28/2018   Diverticular  hemorrhage 03/28/2018   Hyperlipidemia 03/28/2018   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 and stage 3 chronic kidney disease ([place]) 03/28/2018   Left ventricular systolic dysfunction 84/66/5993   Segmental LAD distribution EF 45-50%   Prostate cancer ([place])    PTSD (post-traumatic stress disorder) 03/28/2018   TIA (transient ischemic attack) 03/28/2018    Past Surgical History:  Procedure Laterality Date   APPENDECTOMY     CORONARY STENT INTERVENTION N/A 05/16/2022   Procedure: CORONARY STENT INTERVENTION;  Surgeon: Sherren Mocha, MD;  Location: [place] [facility];  Service: Cardiovascular;  Laterality: N/A;   INGUINAL HERNIA REPAIR     INSERTION PROSTATE RADIATION SEED     LEFT HEART CATH AND CORONARY ANGIOGRAPHY N/A 05/16/2022   Procedure: LEFT HEART CATH AND CORONARY ANGIOGRAPHY;  Surgeon: Sherren Mocha, MD;  Location: [place] [facility];  Service: Cardiovascular;  Laterality: N/A;   STOMACH SURGERY     TONSILLECTOMY      Current Medications: No outpatient medications have been marked as taking for the 08/24/22 encounter (Appointment) with Richardo Priest, MD.     Allergies:   Hydrocodone   Social History   Socioeconomic History   Marital status: Married    Spouse name: Not on file   Number of children: Not on file   Years of education: Not on file   Highest education level: Not on file  Occupational History   Not on file  Tobacco Use   Smoking status: Former  Packs/day: 1.00    Years: 30.00    Total pack years: 30.00    Types: Cigarettes    Quit date: 26    Years since quitting: 36.0   Smokeless tobacco: Never  Vaping Use   Vaping Use: Never used  Substance and Sexual Activity   Alcohol use: Yes    Alcohol/week: 4.0 - 6.0 standard drinks of alcohol    Types: 2 - 3 Cans of beer, 2 - 3 Shots of liquor per week    Comment: 2-3 day beers or vodka   Drug use: Not Currently   Sexual activity: Not on file  Other  Topics Concern   Not on file  Social History Narrative   Not on file   Social Determinants of Health   Financial Resource Strain: Not on file  Food Insecurity: No Food Insecurity (05/15/2022)   Hunger Vital Sign    Worried About Running Out of Food in the Last Year: Never true    Ran Out of Food in the Last Year: Never true  Transportation Needs: No Transportation Needs (05/15/2022)   PRAPARE - Hydrologist (Medical): No    Lack of Transportation (Non-Medical): No  Physical Activity: Not on file  Stress: Not on file  Social Connections: Not on file     Family History: The patient's ***family history includes COPD in his father; Heart attack in his father; Heart disease in his father; Hypertension in his maternal grandmother and paternal grandmother. ROS:   Please see the history of present illness.    All other systems reviewed and are negative.  EKGs/Labs/Other Studies Reviewed:    The following studies were reviewed today:  Echo 05/11/22 at [facility] LVEF: 55-60% and mild MR   Cath: 05/16/22 1.  Severe proximal LAD stenosis treated with a 3.5 x 16 mm Synergy DES, postdilated to high-pressure with a 3.75 mm [place] balloon 2.  Mild to moderate nonobstructive proximal left circumflex stenosis 3.  Widely patent, large dominant RCA with no stenosis 4.  Low LVEDP   Recommendations: Continue dual antiplatelet therapy with aspirin and clopidogrel at least 12 months without interruption (ACS class I guideline).  Aggressive risk reduction measures.  As long as no complications arise, patient could be discharged from the hospital tomorrow.    Intervention  EKG:  EKG ordered today and personally reviewed.  The ekg ordered today demonstrates ***  Recent Labs: 05/16/2022: Magnesium 1.8 06/02/2022: BUN 27; Creatinine, Ser 1.37; Hemoglobin 12.9; Platelets 243; Potassium 4.0; Sodium 144  Recent Lipid Panel    Component Value Date/Time   CHOL 164 05/16/2022  0217   TRIG 78 05/16/2022 0217   HDL 52 05/16/2022 0217   CHOLHDL 3.2 05/16/2022 0217   VLDL 16 05/16/2022 0217   LDLCALC 96 05/16/2022 0217    Physical Exam:    VS:  There were no vitals taken for this visit.    Wt Readings from Last 3 Encounters:  06/23/22 180 lb 3.2 oz (81.7 kg)  06/02/22 189 lb 9.6 oz (86 kg)  05/15/22 195 lb 5.2 oz (88.6 kg)     GEN: *** Well nourished, well developed in no acute distress HEENT: Normal NECK: No JVD; No carotid bruits LYMPHATICS: No lymphadenopathy CARDIAC: ***RRR, no murmurs, rubs, gallops RESPIRATORY:  Clear to auscultation without rales, wheezing or rhonchi  ABDOMEN: Soft, non-tender, non-distended MUSCULOSKELETAL:  No edema; No deformity  SKIN: Warm and dry NEUROLOGIC:  Alert and oriented x 3 PSYCHIATRIC:  Normal  affect    Signed, Shirlee More, MD  08/23/2022 8:40 AM    Andre Ross

## 2022-08-24 ENCOUNTER — Ambulatory Visit: Payer: No Typology Code available for payment source | Admitting: Cardiology

## 2022-08-24 DIAGNOSIS — E782 Mixed hyperlipidemia: Secondary | ICD-10-CM | POA: Diagnosis not present

## 2022-08-24 DIAGNOSIS — N183 Chronic kidney disease, stage 3 unspecified: Secondary | ICD-10-CM | POA: Diagnosis not present

## 2022-08-24 DIAGNOSIS — I2510 Atherosclerotic heart disease of native coronary artery without angina pectoris: Secondary | ICD-10-CM | POA: Diagnosis not present

## 2022-08-24 DIAGNOSIS — F32A Depression, unspecified: Secondary | ICD-10-CM | POA: Diagnosis not present

## 2022-08-24 DIAGNOSIS — R262 Difficulty in walking, not elsewhere classified: Secondary | ICD-10-CM | POA: Diagnosis not present

## 2022-08-24 DIAGNOSIS — M109 Gout, unspecified: Secondary | ICD-10-CM | POA: Diagnosis not present

## 2022-08-24 DIAGNOSIS — N400 Enlarged prostate without lower urinary tract symptoms: Secondary | ICD-10-CM | POA: Diagnosis not present

## 2022-08-24 DIAGNOSIS — R293 Abnormal posture: Secondary | ICD-10-CM | POA: Diagnosis not present

## 2022-08-24 DIAGNOSIS — Z8673 Personal history of transient ischemic attack (TIA), and cerebral infarction without residual deficits: Secondary | ICD-10-CM | POA: Diagnosis not present

## 2022-08-24 DIAGNOSIS — I252 Old myocardial infarction: Secondary | ICD-10-CM | POA: Diagnosis not present

## 2022-08-24 DIAGNOSIS — R55 Syncope and collapse: Secondary | ICD-10-CM | POA: Diagnosis not present

## 2022-08-24 DIAGNOSIS — S0511XA Contusion of eyeball and orbital tissues, right eye, initial encounter: Secondary | ICD-10-CM | POA: Diagnosis not present

## 2022-08-24 DIAGNOSIS — J449 Chronic obstructive pulmonary disease, unspecified: Secondary | ICD-10-CM | POA: Diagnosis not present

## 2022-08-24 DIAGNOSIS — D649 Anemia, unspecified: Secondary | ICD-10-CM | POA: Diagnosis not present

## 2022-08-24 DIAGNOSIS — G250 Essential tremor: Secondary | ICD-10-CM | POA: Diagnosis not present

## 2022-08-24 DIAGNOSIS — K5731 Diverticulosis of large intestine without perforation or abscess with bleeding: Secondary | ICD-10-CM | POA: Diagnosis not present

## 2022-08-24 DIAGNOSIS — M6281 Muscle weakness (generalized): Secondary | ICD-10-CM | POA: Diagnosis not present

## 2022-08-24 DIAGNOSIS — I509 Heart failure, unspecified: Secondary | ICD-10-CM | POA: Diagnosis not present

## 2022-08-24 DIAGNOSIS — D500 Iron deficiency anemia secondary to blood loss (chronic): Secondary | ICD-10-CM | POA: Diagnosis not present

## 2022-08-24 DIAGNOSIS — E039 Hypothyroidism, unspecified: Secondary | ICD-10-CM | POA: Diagnosis not present

## 2022-08-24 DIAGNOSIS — K5730 Diverticulosis of large intestine without perforation or abscess without bleeding: Secondary | ICD-10-CM | POA: Diagnosis not present

## 2022-08-24 DIAGNOSIS — I10 Essential (primary) hypertension: Secondary | ICD-10-CM | POA: Diagnosis not present

## 2022-08-25 DIAGNOSIS — R55 Syncope and collapse: Secondary | ICD-10-CM | POA: Diagnosis not present

## 2022-08-25 DIAGNOSIS — J449 Chronic obstructive pulmonary disease, unspecified: Secondary | ICD-10-CM | POA: Diagnosis not present

## 2022-08-25 DIAGNOSIS — D500 Iron deficiency anemia secondary to blood loss (chronic): Secondary | ICD-10-CM | POA: Diagnosis not present

## 2022-08-25 DIAGNOSIS — K5730 Diverticulosis of large intestine without perforation or abscess without bleeding: Secondary | ICD-10-CM | POA: Diagnosis not present

## 2022-08-29 DIAGNOSIS — N183 Chronic kidney disease, stage 3 unspecified: Secondary | ICD-10-CM | POA: Diagnosis not present

## 2022-08-29 DIAGNOSIS — D500 Iron deficiency anemia secondary to blood loss (chronic): Secondary | ICD-10-CM | POA: Diagnosis not present

## 2022-08-29 DIAGNOSIS — I509 Heart failure, unspecified: Secondary | ICD-10-CM | POA: Diagnosis not present

## 2022-08-29 DIAGNOSIS — E039 Hypothyroidism, unspecified: Secondary | ICD-10-CM | POA: Diagnosis not present

## 2022-08-31 DIAGNOSIS — J449 Chronic obstructive pulmonary disease, unspecified: Secondary | ICD-10-CM | POA: Diagnosis not present

## 2022-08-31 DIAGNOSIS — S0511XA Contusion of eyeball and orbital tissues, right eye, initial encounter: Secondary | ICD-10-CM | POA: Diagnosis not present

## 2022-08-31 DIAGNOSIS — K5730 Diverticulosis of large intestine without perforation or abscess without bleeding: Secondary | ICD-10-CM | POA: Diagnosis not present

## 2022-08-31 DIAGNOSIS — D649 Anemia, unspecified: Secondary | ICD-10-CM | POA: Diagnosis not present

## 2022-09-12 DIAGNOSIS — J449 Chronic obstructive pulmonary disease, unspecified: Secondary | ICD-10-CM | POA: Diagnosis not present

## 2022-09-12 DIAGNOSIS — K5730 Diverticulosis of large intestine without perforation or abscess without bleeding: Secondary | ICD-10-CM | POA: Diagnosis not present

## 2022-09-12 DIAGNOSIS — D500 Iron deficiency anemia secondary to blood loss (chronic): Secondary | ICD-10-CM | POA: Diagnosis not present

## 2022-09-12 DIAGNOSIS — R55 Syncope and collapse: Secondary | ICD-10-CM | POA: Diagnosis not present

## 2022-09-19 DIAGNOSIS — I509 Heart failure, unspecified: Secondary | ICD-10-CM | POA: Diagnosis not present

## 2022-09-19 DIAGNOSIS — I252 Old myocardial infarction: Secondary | ICD-10-CM | POA: Diagnosis not present

## 2022-09-19 DIAGNOSIS — K5731 Diverticulosis of large intestine without perforation or abscess with bleeding: Secondary | ICD-10-CM | POA: Diagnosis not present

## 2022-09-19 DIAGNOSIS — M109 Gout, unspecified: Secondary | ICD-10-CM | POA: Diagnosis not present

## 2022-09-19 DIAGNOSIS — I2510 Atherosclerotic heart disease of native coronary artery without angina pectoris: Secondary | ICD-10-CM | POA: Diagnosis not present

## 2022-09-19 DIAGNOSIS — E039 Hypothyroidism, unspecified: Secondary | ICD-10-CM | POA: Diagnosis not present

## 2022-09-19 DIAGNOSIS — N400 Enlarged prostate without lower urinary tract symptoms: Secondary | ICD-10-CM | POA: Diagnosis not present

## 2022-09-19 DIAGNOSIS — I130 Hypertensive heart and chronic kidney disease with heart failure and stage 1 through stage 4 chronic kidney disease, or unspecified chronic kidney disease: Secondary | ICD-10-CM | POA: Diagnosis not present

## 2022-09-19 DIAGNOSIS — N183 Chronic kidney disease, stage 3 unspecified: Secondary | ICD-10-CM | POA: Diagnosis not present

## 2022-09-19 DIAGNOSIS — J449 Chronic obstructive pulmonary disease, unspecified: Secondary | ICD-10-CM | POA: Diagnosis not present

## 2022-09-19 DIAGNOSIS — E782 Mixed hyperlipidemia: Secondary | ICD-10-CM | POA: Diagnosis not present

## 2022-09-19 DIAGNOSIS — F32A Depression, unspecified: Secondary | ICD-10-CM | POA: Diagnosis not present

## 2022-09-19 DIAGNOSIS — Z8673 Personal history of transient ischemic attack (TIA), and cerebral infarction without residual deficits: Secondary | ICD-10-CM | POA: Diagnosis not present

## 2022-09-19 DIAGNOSIS — G250 Essential tremor: Secondary | ICD-10-CM | POA: Diagnosis not present

## 2022-09-19 DIAGNOSIS — D500 Iron deficiency anemia secondary to blood loss (chronic): Secondary | ICD-10-CM | POA: Diagnosis not present

## 2022-09-22 DIAGNOSIS — J449 Chronic obstructive pulmonary disease, unspecified: Secondary | ICD-10-CM | POA: Diagnosis not present

## 2022-09-22 DIAGNOSIS — N183 Chronic kidney disease, stage 3 unspecified: Secondary | ICD-10-CM | POA: Diagnosis not present

## 2022-09-22 DIAGNOSIS — I130 Hypertensive heart and chronic kidney disease with heart failure and stage 1 through stage 4 chronic kidney disease, or unspecified chronic kidney disease: Secondary | ICD-10-CM | POA: Diagnosis not present

## 2022-09-22 DIAGNOSIS — I509 Heart failure, unspecified: Secondary | ICD-10-CM | POA: Diagnosis not present

## 2022-09-22 DIAGNOSIS — K5731 Diverticulosis of large intestine without perforation or abscess with bleeding: Secondary | ICD-10-CM | POA: Diagnosis not present

## 2022-09-22 DIAGNOSIS — D500 Iron deficiency anemia secondary to blood loss (chronic): Secondary | ICD-10-CM | POA: Diagnosis not present

## 2022-09-29 DIAGNOSIS — N183 Chronic kidney disease, stage 3 unspecified: Secondary | ICD-10-CM | POA: Diagnosis not present

## 2022-09-29 DIAGNOSIS — K5731 Diverticulosis of large intestine without perforation or abscess with bleeding: Secondary | ICD-10-CM | POA: Diagnosis not present

## 2022-09-29 DIAGNOSIS — I509 Heart failure, unspecified: Secondary | ICD-10-CM | POA: Diagnosis not present

## 2022-09-29 DIAGNOSIS — D500 Iron deficiency anemia secondary to blood loss (chronic): Secondary | ICD-10-CM | POA: Diagnosis not present

## 2022-09-29 DIAGNOSIS — I130 Hypertensive heart and chronic kidney disease with heart failure and stage 1 through stage 4 chronic kidney disease, or unspecified chronic kidney disease: Secondary | ICD-10-CM | POA: Diagnosis not present

## 2022-09-29 DIAGNOSIS — J449 Chronic obstructive pulmonary disease, unspecified: Secondary | ICD-10-CM | POA: Diagnosis not present

## 2022-10-05 DIAGNOSIS — I509 Heart failure, unspecified: Secondary | ICD-10-CM | POA: Diagnosis not present

## 2022-10-05 DIAGNOSIS — I130 Hypertensive heart and chronic kidney disease with heart failure and stage 1 through stage 4 chronic kidney disease, or unspecified chronic kidney disease: Secondary | ICD-10-CM | POA: Diagnosis not present

## 2022-10-05 DIAGNOSIS — K5731 Diverticulosis of large intestine without perforation or abscess with bleeding: Secondary | ICD-10-CM | POA: Diagnosis not present

## 2022-10-05 DIAGNOSIS — D500 Iron deficiency anemia secondary to blood loss (chronic): Secondary | ICD-10-CM | POA: Diagnosis not present

## 2022-10-05 DIAGNOSIS — J449 Chronic obstructive pulmonary disease, unspecified: Secondary | ICD-10-CM | POA: Diagnosis not present

## 2022-10-05 DIAGNOSIS — N183 Chronic kidney disease, stage 3 unspecified: Secondary | ICD-10-CM | POA: Diagnosis not present

## 2022-10-12 DIAGNOSIS — K5731 Diverticulosis of large intestine without perforation or abscess with bleeding: Secondary | ICD-10-CM | POA: Diagnosis not present

## 2022-10-12 DIAGNOSIS — I130 Hypertensive heart and chronic kidney disease with heart failure and stage 1 through stage 4 chronic kidney disease, or unspecified chronic kidney disease: Secondary | ICD-10-CM | POA: Diagnosis not present

## 2022-10-12 DIAGNOSIS — D500 Iron deficiency anemia secondary to blood loss (chronic): Secondary | ICD-10-CM | POA: Diagnosis not present

## 2022-10-12 DIAGNOSIS — J449 Chronic obstructive pulmonary disease, unspecified: Secondary | ICD-10-CM | POA: Diagnosis not present

## 2022-10-12 DIAGNOSIS — I509 Heart failure, unspecified: Secondary | ICD-10-CM | POA: Diagnosis not present

## 2022-10-12 DIAGNOSIS — N183 Chronic kidney disease, stage 3 unspecified: Secondary | ICD-10-CM | POA: Diagnosis not present

## 2022-10-13 DIAGNOSIS — K5731 Diverticulosis of large intestine without perforation or abscess with bleeding: Secondary | ICD-10-CM | POA: Diagnosis not present

## 2022-10-13 DIAGNOSIS — N183 Chronic kidney disease, stage 3 unspecified: Secondary | ICD-10-CM | POA: Diagnosis not present

## 2022-10-13 DIAGNOSIS — I130 Hypertensive heart and chronic kidney disease with heart failure and stage 1 through stage 4 chronic kidney disease, or unspecified chronic kidney disease: Secondary | ICD-10-CM | POA: Diagnosis not present

## 2022-10-13 DIAGNOSIS — D500 Iron deficiency anemia secondary to blood loss (chronic): Secondary | ICD-10-CM | POA: Diagnosis not present

## 2022-10-13 DIAGNOSIS — I509 Heart failure, unspecified: Secondary | ICD-10-CM | POA: Diagnosis not present

## 2022-10-13 DIAGNOSIS — J449 Chronic obstructive pulmonary disease, unspecified: Secondary | ICD-10-CM | POA: Diagnosis not present

## 2022-10-19 DIAGNOSIS — I130 Hypertensive heart and chronic kidney disease with heart failure and stage 1 through stage 4 chronic kidney disease, or unspecified chronic kidney disease: Secondary | ICD-10-CM | POA: Diagnosis not present

## 2022-10-19 DIAGNOSIS — Z8673 Personal history of transient ischemic attack (TIA), and cerebral infarction without residual deficits: Secondary | ICD-10-CM | POA: Diagnosis not present

## 2022-10-19 DIAGNOSIS — G250 Essential tremor: Secondary | ICD-10-CM | POA: Diagnosis not present

## 2022-10-19 DIAGNOSIS — J449 Chronic obstructive pulmonary disease, unspecified: Secondary | ICD-10-CM | POA: Diagnosis not present

## 2022-10-19 DIAGNOSIS — I252 Old myocardial infarction: Secondary | ICD-10-CM | POA: Diagnosis not present

## 2022-10-19 DIAGNOSIS — I2510 Atherosclerotic heart disease of native coronary artery without angina pectoris: Secondary | ICD-10-CM | POA: Diagnosis not present

## 2022-10-19 DIAGNOSIS — I509 Heart failure, unspecified: Secondary | ICD-10-CM | POA: Diagnosis not present

## 2022-10-19 DIAGNOSIS — N183 Chronic kidney disease, stage 3 unspecified: Secondary | ICD-10-CM | POA: Diagnosis not present

## 2022-10-19 DIAGNOSIS — E039 Hypothyroidism, unspecified: Secondary | ICD-10-CM | POA: Diagnosis not present

## 2022-10-19 DIAGNOSIS — K5731 Diverticulosis of large intestine without perforation or abscess with bleeding: Secondary | ICD-10-CM | POA: Diagnosis not present

## 2022-10-19 DIAGNOSIS — D500 Iron deficiency anemia secondary to blood loss (chronic): Secondary | ICD-10-CM | POA: Diagnosis not present

## 2022-10-19 DIAGNOSIS — M109 Gout, unspecified: Secondary | ICD-10-CM | POA: Diagnosis not present

## 2022-10-19 DIAGNOSIS — E782 Mixed hyperlipidemia: Secondary | ICD-10-CM | POA: Diagnosis not present

## 2022-10-19 DIAGNOSIS — F32A Depression, unspecified: Secondary | ICD-10-CM | POA: Diagnosis not present

## 2022-10-19 DIAGNOSIS — N400 Enlarged prostate without lower urinary tract symptoms: Secondary | ICD-10-CM | POA: Diagnosis not present

## 2022-10-21 DIAGNOSIS — K5731 Diverticulosis of large intestine without perforation or abscess with bleeding: Secondary | ICD-10-CM | POA: Diagnosis not present

## 2022-10-21 DIAGNOSIS — I130 Hypertensive heart and chronic kidney disease with heart failure and stage 1 through stage 4 chronic kidney disease, or unspecified chronic kidney disease: Secondary | ICD-10-CM | POA: Diagnosis not present

## 2022-10-21 DIAGNOSIS — J449 Chronic obstructive pulmonary disease, unspecified: Secondary | ICD-10-CM | POA: Diagnosis not present

## 2022-10-21 DIAGNOSIS — N183 Chronic kidney disease, stage 3 unspecified: Secondary | ICD-10-CM | POA: Diagnosis not present

## 2022-10-21 DIAGNOSIS — D500 Iron deficiency anemia secondary to blood loss (chronic): Secondary | ICD-10-CM | POA: Diagnosis not present

## 2022-10-21 DIAGNOSIS — I509 Heart failure, unspecified: Secondary | ICD-10-CM | POA: Diagnosis not present

## 2022-10-26 DIAGNOSIS — K5731 Diverticulosis of large intestine without perforation or abscess with bleeding: Secondary | ICD-10-CM | POA: Diagnosis not present

## 2022-10-26 DIAGNOSIS — I509 Heart failure, unspecified: Secondary | ICD-10-CM | POA: Diagnosis not present

## 2022-10-26 DIAGNOSIS — N183 Chronic kidney disease, stage 3 unspecified: Secondary | ICD-10-CM | POA: Diagnosis not present

## 2022-10-26 DIAGNOSIS — D500 Iron deficiency anemia secondary to blood loss (chronic): Secondary | ICD-10-CM | POA: Diagnosis not present

## 2022-10-26 DIAGNOSIS — I130 Hypertensive heart and chronic kidney disease with heart failure and stage 1 through stage 4 chronic kidney disease, or unspecified chronic kidney disease: Secondary | ICD-10-CM | POA: Diagnosis not present

## 2022-10-26 DIAGNOSIS — J449 Chronic obstructive pulmonary disease, unspecified: Secondary | ICD-10-CM | POA: Diagnosis not present

## 2022-11-02 DIAGNOSIS — I130 Hypertensive heart and chronic kidney disease with heart failure and stage 1 through stage 4 chronic kidney disease, or unspecified chronic kidney disease: Secondary | ICD-10-CM | POA: Diagnosis not present

## 2022-11-02 DIAGNOSIS — J449 Chronic obstructive pulmonary disease, unspecified: Secondary | ICD-10-CM | POA: Diagnosis not present

## 2022-11-02 DIAGNOSIS — K5731 Diverticulosis of large intestine without perforation or abscess with bleeding: Secondary | ICD-10-CM | POA: Diagnosis not present

## 2022-11-02 DIAGNOSIS — N183 Chronic kidney disease, stage 3 unspecified: Secondary | ICD-10-CM | POA: Diagnosis not present

## 2022-11-02 DIAGNOSIS — D500 Iron deficiency anemia secondary to blood loss (chronic): Secondary | ICD-10-CM | POA: Diagnosis not present

## 2022-11-02 DIAGNOSIS — I509 Heart failure, unspecified: Secondary | ICD-10-CM | POA: Diagnosis not present

## 2022-11-03 DIAGNOSIS — D500 Iron deficiency anemia secondary to blood loss (chronic): Secondary | ICD-10-CM | POA: Diagnosis not present

## 2022-11-03 DIAGNOSIS — I509 Heart failure, unspecified: Secondary | ICD-10-CM | POA: Diagnosis not present

## 2022-11-03 DIAGNOSIS — N183 Chronic kidney disease, stage 3 unspecified: Secondary | ICD-10-CM | POA: Diagnosis not present

## 2022-11-03 DIAGNOSIS — K5731 Diverticulosis of large intestine without perforation or abscess with bleeding: Secondary | ICD-10-CM | POA: Diagnosis not present

## 2022-11-03 DIAGNOSIS — I130 Hypertensive heart and chronic kidney disease with heart failure and stage 1 through stage 4 chronic kidney disease, or unspecified chronic kidney disease: Secondary | ICD-10-CM | POA: Diagnosis not present

## 2022-11-03 DIAGNOSIS — J449 Chronic obstructive pulmonary disease, unspecified: Secondary | ICD-10-CM | POA: Diagnosis not present

## 2022-11-09 DIAGNOSIS — I509 Heart failure, unspecified: Secondary | ICD-10-CM | POA: Diagnosis not present

## 2022-11-09 DIAGNOSIS — J449 Chronic obstructive pulmonary disease, unspecified: Secondary | ICD-10-CM | POA: Diagnosis not present

## 2022-11-09 DIAGNOSIS — D500 Iron deficiency anemia secondary to blood loss (chronic): Secondary | ICD-10-CM | POA: Diagnosis not present

## 2022-11-09 DIAGNOSIS — N183 Chronic kidney disease, stage 3 unspecified: Secondary | ICD-10-CM | POA: Diagnosis not present

## 2022-11-09 DIAGNOSIS — I130 Hypertensive heart and chronic kidney disease with heart failure and stage 1 through stage 4 chronic kidney disease, or unspecified chronic kidney disease: Secondary | ICD-10-CM | POA: Diagnosis not present

## 2022-11-09 DIAGNOSIS — K5731 Diverticulosis of large intestine without perforation or abscess with bleeding: Secondary | ICD-10-CM | POA: Diagnosis not present

## 2022-11-15 DIAGNOSIS — Z885 Allergy status to narcotic agent status: Secondary | ICD-10-CM | POA: Diagnosis not present

## 2022-11-15 DIAGNOSIS — Z7952 Long term (current) use of systemic steroids: Secondary | ICD-10-CM | POA: Diagnosis not present

## 2022-11-15 DIAGNOSIS — Z7902 Long term (current) use of antithrombotics/antiplatelets: Secondary | ICD-10-CM | POA: Diagnosis not present

## 2022-11-15 DIAGNOSIS — F1090 Alcohol use, unspecified, uncomplicated: Secondary | ICD-10-CM | POA: Diagnosis not present

## 2022-11-15 DIAGNOSIS — I5043 Acute on chronic combined systolic (congestive) and diastolic (congestive) heart failure: Secondary | ICD-10-CM | POA: Diagnosis not present

## 2022-11-15 DIAGNOSIS — M1990 Unspecified osteoarthritis, unspecified site: Secondary | ICD-10-CM | POA: Diagnosis not present

## 2022-11-15 DIAGNOSIS — I130 Hypertensive heart and chronic kidney disease with heart failure and stage 1 through stage 4 chronic kidney disease, or unspecified chronic kidney disease: Secondary | ICD-10-CM | POA: Diagnosis not present

## 2022-11-15 DIAGNOSIS — Z8701 Personal history of pneumonia (recurrent): Secondary | ICD-10-CM | POA: Diagnosis not present

## 2022-11-15 DIAGNOSIS — D500 Iron deficiency anemia secondary to blood loss (chronic): Secondary | ICD-10-CM | POA: Diagnosis not present

## 2022-11-15 DIAGNOSIS — J449 Chronic obstructive pulmonary disease, unspecified: Secondary | ICD-10-CM | POA: Diagnosis not present

## 2022-11-15 DIAGNOSIS — Z886 Allergy status to analgesic agent status: Secondary | ICD-10-CM | POA: Diagnosis not present

## 2022-11-15 DIAGNOSIS — N183 Chronic kidney disease, stage 3 unspecified: Secondary | ICD-10-CM | POA: Diagnosis not present

## 2022-11-15 DIAGNOSIS — J810 Acute pulmonary edema: Secondary | ICD-10-CM | POA: Diagnosis not present

## 2022-11-15 DIAGNOSIS — E039 Hypothyroidism, unspecified: Secondary | ICD-10-CM | POA: Diagnosis not present

## 2022-11-15 DIAGNOSIS — Z8673 Personal history of transient ischemic attack (TIA), and cerebral infarction without residual deficits: Secondary | ICD-10-CM | POA: Diagnosis not present

## 2022-11-15 DIAGNOSIS — Z87891 Personal history of nicotine dependence: Secondary | ICD-10-CM | POA: Diagnosis not present

## 2022-11-15 DIAGNOSIS — Z87440 Personal history of urinary (tract) infections: Secondary | ICD-10-CM | POA: Diagnosis not present

## 2022-11-15 DIAGNOSIS — F419 Anxiety disorder, unspecified: Secondary | ICD-10-CM | POA: Diagnosis not present

## 2022-11-15 DIAGNOSIS — E7800 Pure hypercholesterolemia, unspecified: Secondary | ICD-10-CM | POA: Diagnosis not present

## 2022-11-15 DIAGNOSIS — R0602 Shortness of breath: Secondary | ICD-10-CM | POA: Diagnosis not present

## 2022-11-15 DIAGNOSIS — M109 Gout, unspecified: Secondary | ICD-10-CM | POA: Diagnosis not present

## 2022-11-15 DIAGNOSIS — I5033 Acute on chronic diastolic (congestive) heart failure: Secondary | ICD-10-CM | POA: Diagnosis not present

## 2022-11-15 DIAGNOSIS — K5731 Diverticulosis of large intestine without perforation or abscess with bleeding: Secondary | ICD-10-CM | POA: Diagnosis not present

## 2022-11-15 DIAGNOSIS — I509 Heart failure, unspecified: Secondary | ICD-10-CM | POA: Diagnosis not present

## 2022-11-15 DIAGNOSIS — I10 Essential (primary) hypertension: Secondary | ICD-10-CM | POA: Diagnosis not present

## 2022-11-15 DIAGNOSIS — Z87442 Personal history of urinary calculi: Secondary | ICD-10-CM | POA: Diagnosis not present

## 2022-11-15 DIAGNOSIS — Z8546 Personal history of malignant neoplasm of prostate: Secondary | ICD-10-CM | POA: Diagnosis not present

## 2022-11-15 DIAGNOSIS — Z8711 Personal history of peptic ulcer disease: Secondary | ICD-10-CM | POA: Diagnosis not present

## 2022-11-15 DIAGNOSIS — F32A Depression, unspecified: Secondary | ICD-10-CM | POA: Diagnosis not present

## 2022-11-15 DIAGNOSIS — J9601 Acute respiratory failure with hypoxia: Secondary | ICD-10-CM | POA: Diagnosis not present

## 2022-11-15 DIAGNOSIS — I361 Nonrheumatic tricuspid (valve) insufficiency: Secondary | ICD-10-CM | POA: Diagnosis not present

## 2022-11-25 DIAGNOSIS — Z7951 Long term (current) use of inhaled steroids: Secondary | ICD-10-CM | POA: Diagnosis not present

## 2022-11-25 DIAGNOSIS — I5043 Acute on chronic combined systolic (congestive) and diastolic (congestive) heart failure: Secondary | ICD-10-CM | POA: Diagnosis not present

## 2022-11-25 DIAGNOSIS — J9601 Acute respiratory failure with hypoxia: Secondary | ICD-10-CM | POA: Diagnosis not present

## 2022-11-25 DIAGNOSIS — Z7902 Long term (current) use of antithrombotics/antiplatelets: Secondary | ICD-10-CM | POA: Diagnosis not present

## 2022-11-25 DIAGNOSIS — I130 Hypertensive heart and chronic kidney disease with heart failure and stage 1 through stage 4 chronic kidney disease, or unspecified chronic kidney disease: Secondary | ICD-10-CM | POA: Diagnosis not present

## 2022-11-25 DIAGNOSIS — Z8673 Personal history of transient ischemic attack (TIA), and cerebral infarction without residual deficits: Secondary | ICD-10-CM | POA: Diagnosis not present

## 2022-11-25 DIAGNOSIS — N182 Chronic kidney disease, stage 2 (mild): Secondary | ICD-10-CM | POA: Diagnosis not present

## 2022-11-29 DIAGNOSIS — I130 Hypertensive heart and chronic kidney disease with heart failure and stage 1 through stage 4 chronic kidney disease, or unspecified chronic kidney disease: Secondary | ICD-10-CM | POA: Diagnosis not present

## 2022-11-29 DIAGNOSIS — J9601 Acute respiratory failure with hypoxia: Secondary | ICD-10-CM | POA: Diagnosis not present

## 2022-11-29 DIAGNOSIS — I5043 Acute on chronic combined systolic (congestive) and diastolic (congestive) heart failure: Secondary | ICD-10-CM | POA: Diagnosis not present

## 2022-11-29 DIAGNOSIS — N182 Chronic kidney disease, stage 2 (mild): Secondary | ICD-10-CM | POA: Diagnosis not present

## 2022-11-29 DIAGNOSIS — Z7951 Long term (current) use of inhaled steroids: Secondary | ICD-10-CM | POA: Diagnosis not present

## 2022-11-29 DIAGNOSIS — Z7902 Long term (current) use of antithrombotics/antiplatelets: Secondary | ICD-10-CM | POA: Diagnosis not present

## 2022-12-07 DIAGNOSIS — I5043 Acute on chronic combined systolic (congestive) and diastolic (congestive) heart failure: Secondary | ICD-10-CM | POA: Diagnosis not present

## 2022-12-07 DIAGNOSIS — J9601 Acute respiratory failure with hypoxia: Secondary | ICD-10-CM | POA: Diagnosis not present

## 2022-12-07 DIAGNOSIS — I130 Hypertensive heart and chronic kidney disease with heart failure and stage 1 through stage 4 chronic kidney disease, or unspecified chronic kidney disease: Secondary | ICD-10-CM | POA: Diagnosis not present

## 2022-12-07 DIAGNOSIS — N182 Chronic kidney disease, stage 2 (mild): Secondary | ICD-10-CM | POA: Diagnosis not present

## 2022-12-07 DIAGNOSIS — Z7902 Long term (current) use of antithrombotics/antiplatelets: Secondary | ICD-10-CM | POA: Diagnosis not present

## 2022-12-07 DIAGNOSIS — Z7951 Long term (current) use of inhaled steroids: Secondary | ICD-10-CM | POA: Diagnosis not present

## 2022-12-08 DIAGNOSIS — Z7902 Long term (current) use of antithrombotics/antiplatelets: Secondary | ICD-10-CM | POA: Diagnosis not present

## 2022-12-08 DIAGNOSIS — I5043 Acute on chronic combined systolic (congestive) and diastolic (congestive) heart failure: Secondary | ICD-10-CM | POA: Diagnosis not present

## 2022-12-08 DIAGNOSIS — N182 Chronic kidney disease, stage 2 (mild): Secondary | ICD-10-CM | POA: Diagnosis not present

## 2022-12-08 DIAGNOSIS — J9601 Acute respiratory failure with hypoxia: Secondary | ICD-10-CM | POA: Diagnosis not present

## 2022-12-08 DIAGNOSIS — I130 Hypertensive heart and chronic kidney disease with heart failure and stage 1 through stage 4 chronic kidney disease, or unspecified chronic kidney disease: Secondary | ICD-10-CM | POA: Diagnosis not present

## 2022-12-08 DIAGNOSIS — Z7951 Long term (current) use of inhaled steroids: Secondary | ICD-10-CM | POA: Diagnosis not present

## 2022-12-09 DIAGNOSIS — Z7951 Long term (current) use of inhaled steroids: Secondary | ICD-10-CM | POA: Diagnosis not present

## 2022-12-09 DIAGNOSIS — N182 Chronic kidney disease, stage 2 (mild): Secondary | ICD-10-CM | POA: Diagnosis not present

## 2022-12-09 DIAGNOSIS — J9601 Acute respiratory failure with hypoxia: Secondary | ICD-10-CM | POA: Diagnosis not present

## 2022-12-09 DIAGNOSIS — Z7902 Long term (current) use of antithrombotics/antiplatelets: Secondary | ICD-10-CM | POA: Diagnosis not present

## 2022-12-09 DIAGNOSIS — I5043 Acute on chronic combined systolic (congestive) and diastolic (congestive) heart failure: Secondary | ICD-10-CM | POA: Diagnosis not present

## 2022-12-09 DIAGNOSIS — I130 Hypertensive heart and chronic kidney disease with heart failure and stage 1 through stage 4 chronic kidney disease, or unspecified chronic kidney disease: Secondary | ICD-10-CM | POA: Diagnosis not present

## 2022-12-13 DIAGNOSIS — Z7902 Long term (current) use of antithrombotics/antiplatelets: Secondary | ICD-10-CM | POA: Diagnosis not present

## 2022-12-13 DIAGNOSIS — Z7951 Long term (current) use of inhaled steroids: Secondary | ICD-10-CM | POA: Diagnosis not present

## 2022-12-13 DIAGNOSIS — I5043 Acute on chronic combined systolic (congestive) and diastolic (congestive) heart failure: Secondary | ICD-10-CM | POA: Diagnosis not present

## 2022-12-13 DIAGNOSIS — I130 Hypertensive heart and chronic kidney disease with heart failure and stage 1 through stage 4 chronic kidney disease, or unspecified chronic kidney disease: Secondary | ICD-10-CM | POA: Diagnosis not present

## 2022-12-13 DIAGNOSIS — J9601 Acute respiratory failure with hypoxia: Secondary | ICD-10-CM | POA: Diagnosis not present

## 2022-12-13 DIAGNOSIS — N182 Chronic kidney disease, stage 2 (mild): Secondary | ICD-10-CM | POA: Diagnosis not present

## 2022-12-14 DIAGNOSIS — Z7902 Long term (current) use of antithrombotics/antiplatelets: Secondary | ICD-10-CM | POA: Diagnosis not present

## 2022-12-14 DIAGNOSIS — J9601 Acute respiratory failure with hypoxia: Secondary | ICD-10-CM | POA: Diagnosis not present

## 2022-12-14 DIAGNOSIS — N182 Chronic kidney disease, stage 2 (mild): Secondary | ICD-10-CM | POA: Diagnosis not present

## 2022-12-14 DIAGNOSIS — Z7951 Long term (current) use of inhaled steroids: Secondary | ICD-10-CM | POA: Diagnosis not present

## 2022-12-14 DIAGNOSIS — I5043 Acute on chronic combined systolic (congestive) and diastolic (congestive) heart failure: Secondary | ICD-10-CM | POA: Diagnosis not present

## 2022-12-14 DIAGNOSIS — I130 Hypertensive heart and chronic kidney disease with heart failure and stage 1 through stage 4 chronic kidney disease, or unspecified chronic kidney disease: Secondary | ICD-10-CM | POA: Diagnosis not present

## 2022-12-19 DIAGNOSIS — J9601 Acute respiratory failure with hypoxia: Secondary | ICD-10-CM | POA: Diagnosis not present

## 2022-12-19 DIAGNOSIS — Z7951 Long term (current) use of inhaled steroids: Secondary | ICD-10-CM | POA: Diagnosis not present

## 2022-12-19 DIAGNOSIS — Z7902 Long term (current) use of antithrombotics/antiplatelets: Secondary | ICD-10-CM | POA: Diagnosis not present

## 2022-12-19 DIAGNOSIS — N182 Chronic kidney disease, stage 2 (mild): Secondary | ICD-10-CM | POA: Diagnosis not present

## 2022-12-19 DIAGNOSIS — I5043 Acute on chronic combined systolic (congestive) and diastolic (congestive) heart failure: Secondary | ICD-10-CM | POA: Diagnosis not present

## 2022-12-19 DIAGNOSIS — I130 Hypertensive heart and chronic kidney disease with heart failure and stage 1 through stage 4 chronic kidney disease, or unspecified chronic kidney disease: Secondary | ICD-10-CM | POA: Diagnosis not present

## 2022-12-21 DIAGNOSIS — Z7902 Long term (current) use of antithrombotics/antiplatelets: Secondary | ICD-10-CM | POA: Diagnosis not present

## 2022-12-21 DIAGNOSIS — J9601 Acute respiratory failure with hypoxia: Secondary | ICD-10-CM | POA: Diagnosis not present

## 2022-12-21 DIAGNOSIS — I130 Hypertensive heart and chronic kidney disease with heart failure and stage 1 through stage 4 chronic kidney disease, or unspecified chronic kidney disease: Secondary | ICD-10-CM | POA: Diagnosis not present

## 2022-12-21 DIAGNOSIS — I5043 Acute on chronic combined systolic (congestive) and diastolic (congestive) heart failure: Secondary | ICD-10-CM | POA: Diagnosis not present

## 2022-12-21 DIAGNOSIS — Z7951 Long term (current) use of inhaled steroids: Secondary | ICD-10-CM | POA: Diagnosis not present

## 2022-12-21 DIAGNOSIS — N182 Chronic kidney disease, stage 2 (mild): Secondary | ICD-10-CM | POA: Diagnosis not present

## 2022-12-25 DIAGNOSIS — I5043 Acute on chronic combined systolic (congestive) and diastolic (congestive) heart failure: Secondary | ICD-10-CM | POA: Diagnosis not present

## 2022-12-25 DIAGNOSIS — N182 Chronic kidney disease, stage 2 (mild): Secondary | ICD-10-CM | POA: Diagnosis not present

## 2022-12-25 DIAGNOSIS — Z8673 Personal history of transient ischemic attack (TIA), and cerebral infarction without residual deficits: Secondary | ICD-10-CM | POA: Diagnosis not present

## 2022-12-25 DIAGNOSIS — I130 Hypertensive heart and chronic kidney disease with heart failure and stage 1 through stage 4 chronic kidney disease, or unspecified chronic kidney disease: Secondary | ICD-10-CM | POA: Diagnosis not present

## 2022-12-25 DIAGNOSIS — Z7951 Long term (current) use of inhaled steroids: Secondary | ICD-10-CM | POA: Diagnosis not present

## 2022-12-25 DIAGNOSIS — Z7902 Long term (current) use of antithrombotics/antiplatelets: Secondary | ICD-10-CM | POA: Diagnosis not present

## 2022-12-25 DIAGNOSIS — J9601 Acute respiratory failure with hypoxia: Secondary | ICD-10-CM | POA: Diagnosis not present

## 2022-12-28 DIAGNOSIS — Z7902 Long term (current) use of antithrombotics/antiplatelets: Secondary | ICD-10-CM | POA: Diagnosis not present

## 2022-12-28 DIAGNOSIS — I130 Hypertensive heart and chronic kidney disease with heart failure and stage 1 through stage 4 chronic kidney disease, or unspecified chronic kidney disease: Secondary | ICD-10-CM | POA: Diagnosis not present

## 2022-12-28 DIAGNOSIS — I5043 Acute on chronic combined systolic (congestive) and diastolic (congestive) heart failure: Secondary | ICD-10-CM | POA: Diagnosis not present

## 2022-12-28 DIAGNOSIS — N182 Chronic kidney disease, stage 2 (mild): Secondary | ICD-10-CM | POA: Diagnosis not present

## 2022-12-28 DIAGNOSIS — J9601 Acute respiratory failure with hypoxia: Secondary | ICD-10-CM | POA: Diagnosis not present

## 2022-12-28 DIAGNOSIS — Z7951 Long term (current) use of inhaled steroids: Secondary | ICD-10-CM | POA: Diagnosis not present

## 2023-01-04 DIAGNOSIS — I5043 Acute on chronic combined systolic (congestive) and diastolic (congestive) heart failure: Secondary | ICD-10-CM | POA: Diagnosis not present

## 2023-01-04 DIAGNOSIS — Z7902 Long term (current) use of antithrombotics/antiplatelets: Secondary | ICD-10-CM | POA: Diagnosis not present

## 2023-01-04 DIAGNOSIS — Z7951 Long term (current) use of inhaled steroids: Secondary | ICD-10-CM | POA: Diagnosis not present

## 2023-01-04 DIAGNOSIS — N182 Chronic kidney disease, stage 2 (mild): Secondary | ICD-10-CM | POA: Diagnosis not present

## 2023-01-04 DIAGNOSIS — I130 Hypertensive heart and chronic kidney disease with heart failure and stage 1 through stage 4 chronic kidney disease, or unspecified chronic kidney disease: Secondary | ICD-10-CM | POA: Diagnosis not present

## 2023-01-04 DIAGNOSIS — J9601 Acute respiratory failure with hypoxia: Secondary | ICD-10-CM | POA: Diagnosis not present

## 2023-08-02 DIAGNOSIS — Z9181 History of falling: Secondary | ICD-10-CM | POA: Diagnosis not present

## 2023-08-02 DIAGNOSIS — Z0000 Encounter for general adult medical examination without abnormal findings: Secondary | ICD-10-CM | POA: Diagnosis not present

## 2023-11-07 IMAGING — DX DG CHEST 1V PORT
1 series · 1 of 1 positions shown · non-contrast
Comparison: July 21, 2021.

CLINICAL DATA: Shortness of breath.

EXAM:
PORTABLE CHEST 1 VIEW

[chest]
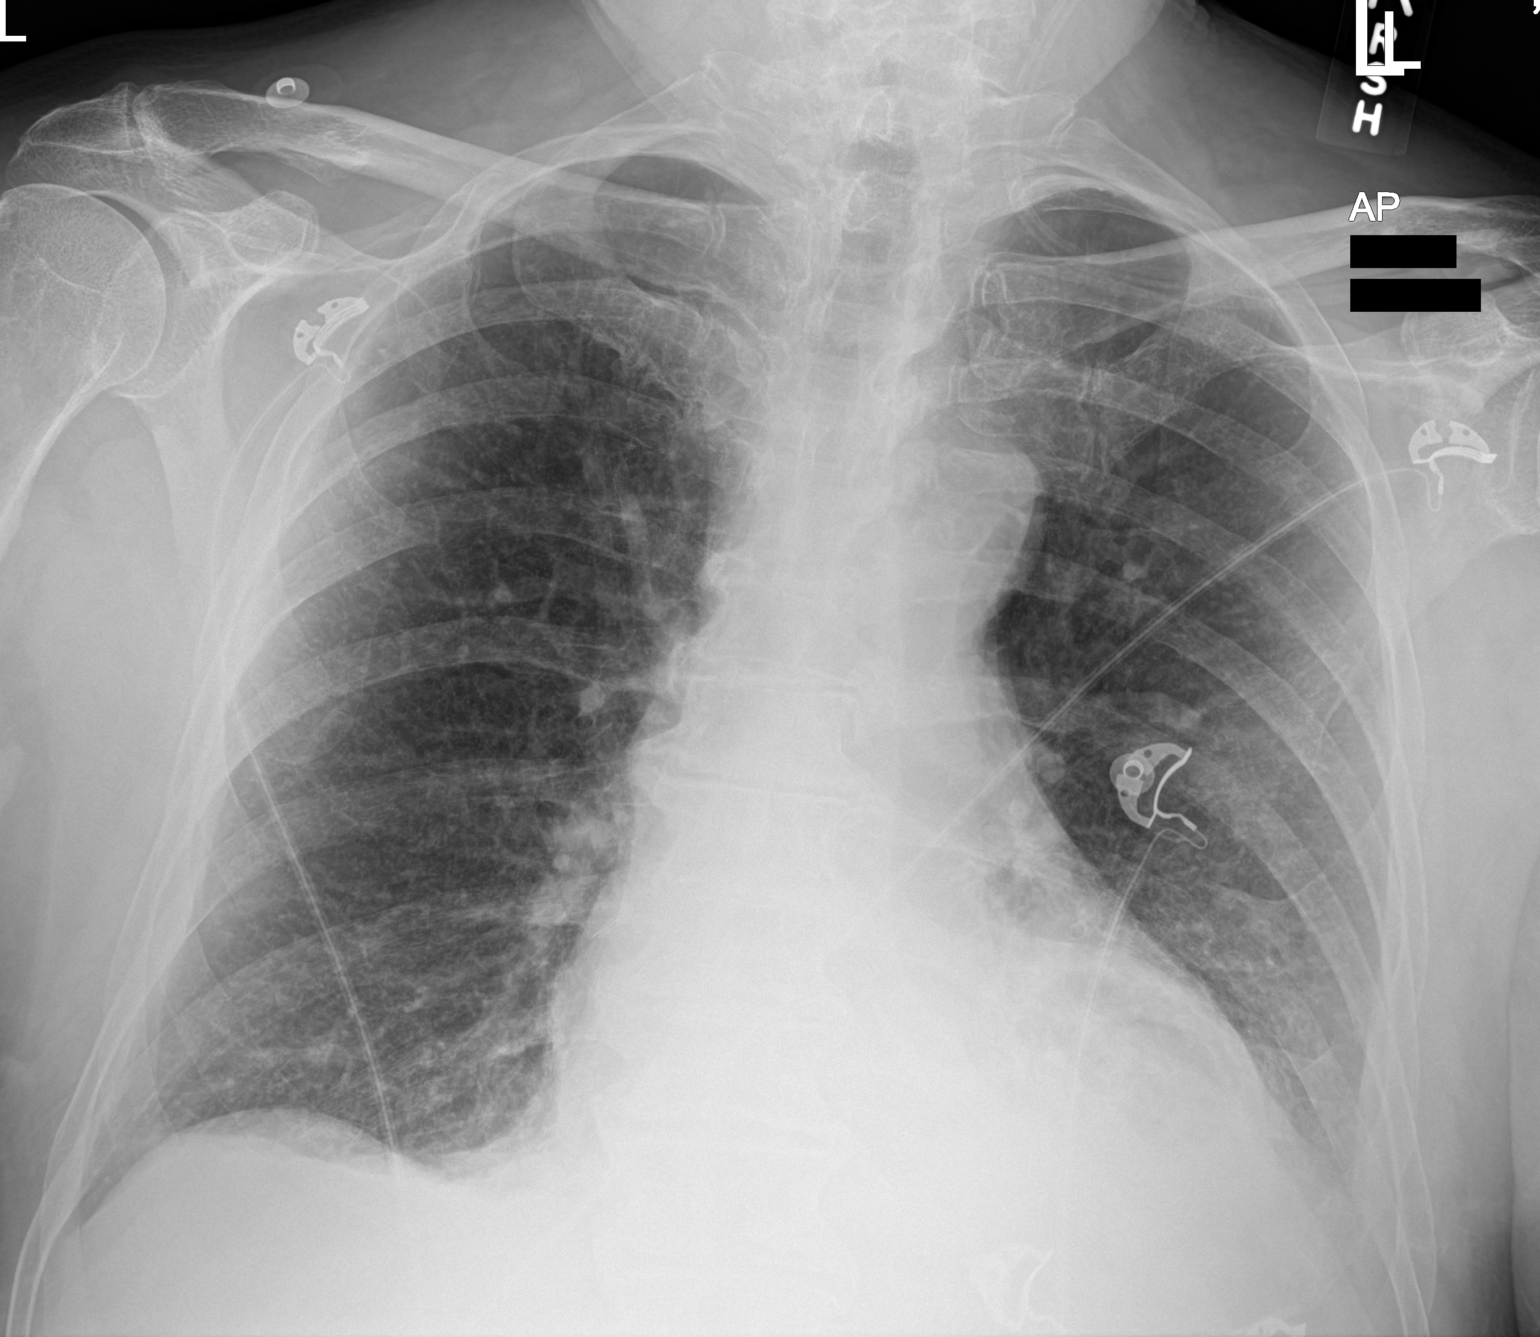

[1 of 1 positions shown; findings below may reference images not displayed]

FINDINGS: Stable cardiomediastinal silhouette. No pneumothorax is noted.
Significantly improved right lung opacity is noted. Continued left
basilar atelectasis or edema is noted with small left pleural
effusion. Bony thorax is unremarkable.
IMPRESSION: Improved right lung opacity compared to prior exam. However,
persistent left basilar atelectasis or edema is noted with small
left pleural effusion.

## 2023-12-07 DIAGNOSIS — N1831 Chronic kidney disease, stage 3a: Secondary | ICD-10-CM | POA: Diagnosis present

## 2023-12-07 DIAGNOSIS — R791 Abnormal coagulation profile: Secondary | ICD-10-CM | POA: Diagnosis present

## 2023-12-07 DIAGNOSIS — M25471 Effusion, right ankle: Secondary | ICD-10-CM | POA: Diagnosis not present

## 2023-12-07 DIAGNOSIS — M109 Gout, unspecified: Secondary | ICD-10-CM | POA: Diagnosis present

## 2023-12-07 DIAGNOSIS — E039 Hypothyroidism, unspecified: Secondary | ICD-10-CM | POA: Diagnosis present

## 2023-12-07 DIAGNOSIS — F32A Depression, unspecified: Secondary | ICD-10-CM | POA: Diagnosis present

## 2023-12-07 DIAGNOSIS — I10 Essential (primary) hypertension: Secondary | ICD-10-CM | POA: Diagnosis not present

## 2023-12-07 DIAGNOSIS — D509 Iron deficiency anemia, unspecified: Secondary | ICD-10-CM | POA: Diagnosis present

## 2023-12-07 DIAGNOSIS — I160 Hypertensive urgency: Secondary | ICD-10-CM | POA: Diagnosis present

## 2023-12-07 DIAGNOSIS — I2510 Atherosclerotic heart disease of native coronary artery without angina pectoris: Secondary | ICD-10-CM | POA: Diagnosis not present

## 2023-12-07 DIAGNOSIS — J440 Chronic obstructive pulmonary disease with acute lower respiratory infection: Secondary | ICD-10-CM | POA: Diagnosis present

## 2023-12-07 DIAGNOSIS — R590 Localized enlarged lymph nodes: Secondary | ICD-10-CM | POA: Diagnosis present

## 2023-12-07 DIAGNOSIS — R0600 Dyspnea, unspecified: Secondary | ICD-10-CM | POA: Diagnosis not present

## 2023-12-07 DIAGNOSIS — M7989 Other specified soft tissue disorders: Secondary | ICD-10-CM | POA: Diagnosis not present

## 2023-12-07 DIAGNOSIS — J9601 Acute respiratory failure with hypoxia: Secondary | ICD-10-CM | POA: Diagnosis present

## 2023-12-07 DIAGNOSIS — R7989 Other specified abnormal findings of blood chemistry: Secondary | ICD-10-CM | POA: Diagnosis not present

## 2023-12-07 DIAGNOSIS — J189 Pneumonia, unspecified organism: Secondary | ICD-10-CM | POA: Diagnosis present

## 2023-12-07 DIAGNOSIS — I5033 Acute on chronic diastolic (congestive) heart failure: Secondary | ICD-10-CM | POA: Diagnosis not present

## 2023-12-07 DIAGNOSIS — I130 Hypertensive heart and chronic kidney disease with heart failure and stage 1 through stage 4 chronic kidney disease, or unspecified chronic kidney disease: Secondary | ICD-10-CM | POA: Diagnosis present

## 2023-12-07 DIAGNOSIS — M25571 Pain in right ankle and joints of right foot: Secondary | ICD-10-CM | POA: Diagnosis not present

## 2023-12-07 DIAGNOSIS — N179 Acute kidney failure, unspecified: Secondary | ICD-10-CM | POA: Diagnosis not present

## 2023-12-07 DIAGNOSIS — I5043 Acute on chronic combined systolic (congestive) and diastolic (congestive) heart failure: Secondary | ICD-10-CM | POA: Diagnosis present

## 2023-12-07 DIAGNOSIS — G4733 Obstructive sleep apnea (adult) (pediatric): Secondary | ICD-10-CM | POA: Diagnosis not present

## 2023-12-07 DIAGNOSIS — R918 Other nonspecific abnormal finding of lung field: Secondary | ICD-10-CM | POA: Diagnosis present

## 2023-12-07 DIAGNOSIS — J441 Chronic obstructive pulmonary disease with (acute) exacerbation: Secondary | ICD-10-CM | POA: Diagnosis present

## 2023-12-07 DIAGNOSIS — F419 Anxiety disorder, unspecified: Secondary | ICD-10-CM | POA: Diagnosis present

## 2023-12-07 DIAGNOSIS — D72829 Elevated white blood cell count, unspecified: Secondary | ICD-10-CM | POA: Diagnosis present

## 2023-12-07 DIAGNOSIS — N400 Enlarged prostate without lower urinary tract symptoms: Secondary | ICD-10-CM | POA: Diagnosis present

## 2023-12-07 DIAGNOSIS — E873 Alkalosis: Secondary | ICD-10-CM | POA: Diagnosis present

## 2023-12-13 DIAGNOSIS — J441 Chronic obstructive pulmonary disease with (acute) exacerbation: Secondary | ICD-10-CM | POA: Diagnosis not present

## 2023-12-13 DIAGNOSIS — M79661 Pain in right lower leg: Secondary | ICD-10-CM | POA: Diagnosis not present

## 2023-12-13 DIAGNOSIS — R5381 Other malaise: Secondary | ICD-10-CM | POA: Diagnosis not present

## 2023-12-13 DIAGNOSIS — J9600 Acute respiratory failure, unspecified whether with hypoxia or hypercapnia: Secondary | ICD-10-CM | POA: Diagnosis not present

## 2023-12-13 DIAGNOSIS — J189 Pneumonia, unspecified organism: Secondary | ICD-10-CM | POA: Diagnosis not present

## 2023-12-13 DIAGNOSIS — M79671 Pain in right foot: Secondary | ICD-10-CM | POA: Diagnosis not present

## 2023-12-14 DIAGNOSIS — J9600 Acute respiratory failure, unspecified whether with hypoxia or hypercapnia: Secondary | ICD-10-CM | POA: Diagnosis not present

## 2023-12-14 DIAGNOSIS — D72829 Elevated white blood cell count, unspecified: Secondary | ICD-10-CM | POA: Diagnosis not present

## 2023-12-14 DIAGNOSIS — M7989 Other specified soft tissue disorders: Secondary | ICD-10-CM | POA: Diagnosis not present

## 2023-12-14 DIAGNOSIS — R7989 Other specified abnormal findings of blood chemistry: Secondary | ICD-10-CM | POA: Diagnosis not present

## 2023-12-14 DIAGNOSIS — E860 Dehydration: Secondary | ICD-10-CM | POA: Diagnosis not present

## 2023-12-15 DIAGNOSIS — J189 Pneumonia, unspecified organism: Secondary | ICD-10-CM | POA: Diagnosis not present

## 2023-12-15 DIAGNOSIS — R0600 Dyspnea, unspecified: Secondary | ICD-10-CM | POA: Diagnosis not present

## 2023-12-15 DIAGNOSIS — R7989 Other specified abnormal findings of blood chemistry: Secondary | ICD-10-CM | POA: Diagnosis not present

## 2023-12-15 DIAGNOSIS — Z66 Do not resuscitate: Secondary | ICD-10-CM | POA: Diagnosis not present

## 2023-12-15 DIAGNOSIS — D72829 Elevated white blood cell count, unspecified: Secondary | ICD-10-CM | POA: Diagnosis not present

## 2023-12-18 DIAGNOSIS — R5381 Other malaise: Secondary | ICD-10-CM | POA: Diagnosis not present

## 2023-12-18 DIAGNOSIS — I2510 Atherosclerotic heart disease of native coronary artery without angina pectoris: Secondary | ICD-10-CM | POA: Diagnosis not present

## 2023-12-18 DIAGNOSIS — R000 Tachycardia, unspecified: Secondary | ICD-10-CM | POA: Diagnosis not present

## 2023-12-18 DIAGNOSIS — R7989 Other specified abnormal findings of blood chemistry: Secondary | ICD-10-CM | POA: Diagnosis not present

## 2023-12-20 DIAGNOSIS — R791 Abnormal coagulation profile: Secondary | ICD-10-CM | POA: Diagnosis not present

## 2023-12-20 DIAGNOSIS — R0602 Shortness of breath: Secondary | ICD-10-CM | POA: Diagnosis not present

## 2023-12-20 DIAGNOSIS — J90 Pleural effusion, not elsewhere classified: Secondary | ICD-10-CM | POA: Diagnosis not present

## 2023-12-25 DIAGNOSIS — R0902 Hypoxemia: Secondary | ICD-10-CM | POA: Diagnosis not present

## 2023-12-26 DIAGNOSIS — J9600 Acute respiratory failure, unspecified whether with hypoxia or hypercapnia: Secondary | ICD-10-CM | POA: Diagnosis not present

## 2023-12-26 DIAGNOSIS — J810 Acute pulmonary edema: Secondary | ICD-10-CM | POA: Diagnosis not present

## 2023-12-26 DIAGNOSIS — J449 Chronic obstructive pulmonary disease, unspecified: Secondary | ICD-10-CM | POA: Diagnosis not present

## 2023-12-28 DIAGNOSIS — R609 Edema, unspecified: Secondary | ICD-10-CM | POA: Diagnosis not present

## 2023-12-29 DIAGNOSIS — J810 Acute pulmonary edema: Secondary | ICD-10-CM | POA: Diagnosis not present

## 2023-12-29 DIAGNOSIS — J9600 Acute respiratory failure, unspecified whether with hypoxia or hypercapnia: Secondary | ICD-10-CM | POA: Diagnosis not present

## 2024-03-07 DIAGNOSIS — Z87440 Personal history of urinary (tract) infections: Secondary | ICD-10-CM | POA: Diagnosis not present

## 2024-03-07 DIAGNOSIS — Z9981 Dependence on supplemental oxygen: Secondary | ICD-10-CM | POA: Diagnosis not present

## 2024-03-07 DIAGNOSIS — N400 Enlarged prostate without lower urinary tract symptoms: Secondary | ICD-10-CM | POA: Diagnosis not present

## 2024-03-07 DIAGNOSIS — F32A Depression, unspecified: Secondary | ICD-10-CM | POA: Diagnosis not present

## 2024-03-07 DIAGNOSIS — I5031 Acute diastolic (congestive) heart failure: Secondary | ICD-10-CM | POA: Diagnosis not present

## 2024-03-07 DIAGNOSIS — I2510 Atherosclerotic heart disease of native coronary artery without angina pectoris: Secondary | ICD-10-CM | POA: Diagnosis not present

## 2024-03-07 DIAGNOSIS — I5033 Acute on chronic diastolic (congestive) heart failure: Secondary | ICD-10-CM | POA: Diagnosis not present

## 2024-03-07 DIAGNOSIS — I252 Old myocardial infarction: Secondary | ICD-10-CM | POA: Diagnosis not present

## 2024-03-07 DIAGNOSIS — F419 Anxiety disorder, unspecified: Secondary | ICD-10-CM | POA: Diagnosis not present

## 2024-03-07 DIAGNOSIS — I160 Hypertensive urgency: Secondary | ICD-10-CM | POA: Diagnosis not present

## 2024-03-07 DIAGNOSIS — I959 Hypotension, unspecified: Secondary | ICD-10-CM | POA: Diagnosis not present

## 2024-03-07 DIAGNOSIS — N179 Acute kidney failure, unspecified: Secondary | ICD-10-CM | POA: Diagnosis not present

## 2024-03-07 DIAGNOSIS — E039 Hypothyroidism, unspecified: Secondary | ICD-10-CM | POA: Diagnosis not present

## 2024-03-07 DIAGNOSIS — I130 Hypertensive heart and chronic kidney disease with heart failure and stage 1 through stage 4 chronic kidney disease, or unspecified chronic kidney disease: Secondary | ICD-10-CM | POA: Diagnosis not present

## 2024-03-07 DIAGNOSIS — Z955 Presence of coronary angioplasty implant and graft: Secondary | ICD-10-CM | POA: Diagnosis not present

## 2024-03-07 DIAGNOSIS — D509 Iron deficiency anemia, unspecified: Secondary | ICD-10-CM | POA: Diagnosis not present

## 2024-03-07 DIAGNOSIS — K5731 Diverticulosis of large intestine without perforation or abscess with bleeding: Secondary | ICD-10-CM | POA: Diagnosis not present

## 2024-03-07 DIAGNOSIS — R0602 Shortness of breath: Secondary | ICD-10-CM | POA: Diagnosis not present

## 2024-03-07 DIAGNOSIS — K922 Gastrointestinal hemorrhage, unspecified: Secondary | ICD-10-CM | POA: Diagnosis not present

## 2024-03-07 DIAGNOSIS — N189 Chronic kidney disease, unspecified: Secondary | ICD-10-CM | POA: Diagnosis not present

## 2024-03-07 DIAGNOSIS — I10 Essential (primary) hypertension: Secondary | ICD-10-CM | POA: Diagnosis not present

## 2024-03-07 DIAGNOSIS — J9621 Acute and chronic respiratory failure with hypoxia: Secondary | ICD-10-CM | POA: Diagnosis not present

## 2024-03-07 DIAGNOSIS — M159 Polyosteoarthritis, unspecified: Secondary | ICD-10-CM | POA: Diagnosis not present

## 2024-03-07 DIAGNOSIS — K5790 Diverticulosis of intestine, part unspecified, without perforation or abscess without bleeding: Secondary | ICD-10-CM | POA: Diagnosis not present

## 2024-03-07 DIAGNOSIS — N1832 Chronic kidney disease, stage 3b: Secondary | ICD-10-CM | POA: Diagnosis not present

## 2024-03-07 DIAGNOSIS — J9601 Acute respiratory failure with hypoxia: Secondary | ICD-10-CM | POA: Diagnosis not present

## 2024-03-07 DIAGNOSIS — J449 Chronic obstructive pulmonary disease, unspecified: Secondary | ICD-10-CM | POA: Diagnosis not present

## 2024-03-07 DIAGNOSIS — E1142 Type 2 diabetes mellitus with diabetic polyneuropathy: Secondary | ICD-10-CM | POA: Diagnosis not present

## 2024-03-07 DIAGNOSIS — Z8673 Personal history of transient ischemic attack (TIA), and cerebral infarction without residual deficits: Secondary | ICD-10-CM | POA: Diagnosis not present

## 2024-03-07 DIAGNOSIS — M109 Gout, unspecified: Secondary | ICD-10-CM | POA: Diagnosis not present

## 2024-03-07 DIAGNOSIS — E7800 Pure hypercholesterolemia, unspecified: Secondary | ICD-10-CM | POA: Diagnosis not present

## 2024-03-07 DIAGNOSIS — I5032 Chronic diastolic (congestive) heart failure: Secondary | ICD-10-CM | POA: Diagnosis not present

## 2024-03-07 DIAGNOSIS — E1122 Type 2 diabetes mellitus with diabetic chronic kidney disease: Secondary | ICD-10-CM | POA: Diagnosis not present

## 2024-03-10 DIAGNOSIS — J449 Chronic obstructive pulmonary disease, unspecified: Secondary | ICD-10-CM | POA: Diagnosis not present

## 2024-03-10 DIAGNOSIS — I5031 Acute diastolic (congestive) heart failure: Secondary | ICD-10-CM | POA: Diagnosis not present

## 2024-03-10 DIAGNOSIS — N189 Chronic kidney disease, unspecified: Secondary | ICD-10-CM | POA: Diagnosis not present

## 2024-03-10 DIAGNOSIS — I5030 Unspecified diastolic (congestive) heart failure: Secondary | ICD-10-CM | POA: Insufficient documentation

## 2024-03-10 DIAGNOSIS — I5032 Chronic diastolic (congestive) heart failure: Secondary | ICD-10-CM | POA: Diagnosis not present

## 2024-03-10 DIAGNOSIS — J9621 Acute and chronic respiratory failure with hypoxia: Secondary | ICD-10-CM | POA: Diagnosis not present

## 2024-03-10 DIAGNOSIS — K5790 Diverticulosis of intestine, part unspecified, without perforation or abscess without bleeding: Secondary | ICD-10-CM | POA: Diagnosis not present

## 2024-03-10 DIAGNOSIS — K922 Gastrointestinal hemorrhage, unspecified: Secondary | ICD-10-CM | POA: Diagnosis not present

## 2024-03-11 DIAGNOSIS — K5790 Diverticulosis of intestine, part unspecified, without perforation or abscess without bleeding: Secondary | ICD-10-CM | POA: Diagnosis not present

## 2024-03-11 DIAGNOSIS — N189 Chronic kidney disease, unspecified: Secondary | ICD-10-CM | POA: Diagnosis not present

## 2024-03-11 DIAGNOSIS — I5032 Chronic diastolic (congestive) heart failure: Secondary | ICD-10-CM | POA: Diagnosis not present

## 2024-03-11 DIAGNOSIS — K922 Gastrointestinal hemorrhage, unspecified: Secondary | ICD-10-CM | POA: Diagnosis not present

## 2024-03-11 DIAGNOSIS — I5031 Acute diastolic (congestive) heart failure: Secondary | ICD-10-CM | POA: Diagnosis not present

## 2024-03-11 DIAGNOSIS — J449 Chronic obstructive pulmonary disease, unspecified: Secondary | ICD-10-CM | POA: Diagnosis not present

## 2024-03-11 DIAGNOSIS — J9621 Acute and chronic respiratory failure with hypoxia: Secondary | ICD-10-CM | POA: Diagnosis not present

## 2024-03-12 DIAGNOSIS — K922 Gastrointestinal hemorrhage, unspecified: Secondary | ICD-10-CM | POA: Diagnosis not present

## 2024-03-13 DIAGNOSIS — K922 Gastrointestinal hemorrhage, unspecified: Secondary | ICD-10-CM | POA: Diagnosis not present

## 2024-03-14 DIAGNOSIS — K922 Gastrointestinal hemorrhage, unspecified: Secondary | ICD-10-CM | POA: Diagnosis not present

## 2024-03-15 DIAGNOSIS — K922 Gastrointestinal hemorrhage, unspecified: Secondary | ICD-10-CM | POA: Diagnosis not present

## 2024-03-16 DIAGNOSIS — K922 Gastrointestinal hemorrhage, unspecified: Secondary | ICD-10-CM | POA: Diagnosis not present

## 2024-03-17 DIAGNOSIS — D638 Anemia in other chronic diseases classified elsewhere: Secondary | ICD-10-CM | POA: Diagnosis not present

## 2024-03-17 DIAGNOSIS — K922 Gastrointestinal hemorrhage, unspecified: Secondary | ICD-10-CM | POA: Diagnosis not present

## 2024-03-17 DIAGNOSIS — D7289 Other specified disorders of white blood cells: Secondary | ICD-10-CM | POA: Diagnosis not present

## 2024-03-17 DIAGNOSIS — D479 Neoplasm of uncertain behavior of lymphoid, hematopoietic and related tissue, unspecified: Secondary | ICD-10-CM | POA: Diagnosis not present

## 2024-03-18 DIAGNOSIS — K922 Gastrointestinal hemorrhage, unspecified: Secondary | ICD-10-CM | POA: Diagnosis not present

## 2024-03-19 DIAGNOSIS — D72829 Elevated white blood cell count, unspecified: Secondary | ICD-10-CM | POA: Diagnosis not present

## 2024-03-19 DIAGNOSIS — K922 Gastrointestinal hemorrhage, unspecified: Secondary | ICD-10-CM | POA: Diagnosis not present

## 2024-03-19 DIAGNOSIS — D649 Anemia, unspecified: Secondary | ICD-10-CM | POA: Diagnosis not present

## 2024-03-20 DIAGNOSIS — K922 Gastrointestinal hemorrhage, unspecified: Secondary | ICD-10-CM | POA: Diagnosis not present

## 2024-03-21 DIAGNOSIS — I5033 Acute on chronic diastolic (congestive) heart failure: Secondary | ICD-10-CM | POA: Diagnosis not present

## 2024-03-21 DIAGNOSIS — D62 Acute posthemorrhagic anemia: Secondary | ICD-10-CM | POA: Diagnosis not present

## 2024-03-21 DIAGNOSIS — D649 Anemia, unspecified: Secondary | ICD-10-CM | POA: Diagnosis not present

## 2024-03-21 DIAGNOSIS — J9621 Acute and chronic respiratory failure with hypoxia: Secondary | ICD-10-CM | POA: Diagnosis not present

## 2024-03-21 DIAGNOSIS — D72829 Elevated white blood cell count, unspecified: Secondary | ICD-10-CM | POA: Diagnosis not present

## 2024-03-21 DIAGNOSIS — D479 Neoplasm of uncertain behavior of lymphoid, hematopoietic and related tissue, unspecified: Secondary | ICD-10-CM | POA: Diagnosis not present

## 2024-03-21 DIAGNOSIS — Z8719 Personal history of other diseases of the digestive system: Secondary | ICD-10-CM | POA: Diagnosis not present

## 2024-03-21 DIAGNOSIS — J90 Pleural effusion, not elsewhere classified: Secondary | ICD-10-CM | POA: Diagnosis not present

## 2024-03-21 DIAGNOSIS — K922 Gastrointestinal hemorrhage, unspecified: Secondary | ICD-10-CM | POA: Diagnosis not present

## 2024-03-22 DIAGNOSIS — K922 Gastrointestinal hemorrhage, unspecified: Secondary | ICD-10-CM | POA: Diagnosis not present

## 2024-03-22 DIAGNOSIS — J90 Pleural effusion, not elsewhere classified: Secondary | ICD-10-CM | POA: Diagnosis not present

## 2024-03-22 DIAGNOSIS — J9621 Acute and chronic respiratory failure with hypoxia: Secondary | ICD-10-CM | POA: Diagnosis not present

## 2024-03-22 DIAGNOSIS — D479 Neoplasm of uncertain behavior of lymphoid, hematopoietic and related tissue, unspecified: Secondary | ICD-10-CM | POA: Diagnosis not present

## 2024-03-22 DIAGNOSIS — D62 Acute posthemorrhagic anemia: Secondary | ICD-10-CM | POA: Diagnosis not present

## 2024-03-22 DIAGNOSIS — Z8719 Personal history of other diseases of the digestive system: Secondary | ICD-10-CM | POA: Diagnosis not present

## 2024-03-22 DIAGNOSIS — I5033 Acute on chronic diastolic (congestive) heart failure: Secondary | ICD-10-CM | POA: Diagnosis not present

## 2024-03-23 DIAGNOSIS — C9110 Chronic lymphocytic leukemia of B-cell type not having achieved remission: Secondary | ICD-10-CM | POA: Diagnosis not present

## 2024-03-23 DIAGNOSIS — D479 Neoplasm of uncertain behavior of lymphoid, hematopoietic and related tissue, unspecified: Secondary | ICD-10-CM | POA: Diagnosis not present

## 2024-03-23 DIAGNOSIS — J90 Pleural effusion, not elsewhere classified: Secondary | ICD-10-CM | POA: Diagnosis not present

## 2024-03-23 DIAGNOSIS — Z8719 Personal history of other diseases of the digestive system: Secondary | ICD-10-CM | POA: Diagnosis not present

## 2024-03-23 DIAGNOSIS — D62 Acute posthemorrhagic anemia: Secondary | ICD-10-CM | POA: Diagnosis not present

## 2024-03-23 DIAGNOSIS — I5033 Acute on chronic diastolic (congestive) heart failure: Secondary | ICD-10-CM | POA: Diagnosis not present

## 2024-03-23 DIAGNOSIS — J9621 Acute and chronic respiratory failure with hypoxia: Secondary | ICD-10-CM | POA: Diagnosis not present

## 2024-03-23 DIAGNOSIS — K922 Gastrointestinal hemorrhage, unspecified: Secondary | ICD-10-CM | POA: Diagnosis not present

## 2024-03-24 DIAGNOSIS — C9110 Chronic lymphocytic leukemia of B-cell type not having achieved remission: Secondary | ICD-10-CM | POA: Diagnosis not present

## 2024-03-24 DIAGNOSIS — D62 Acute posthemorrhagic anemia: Secondary | ICD-10-CM | POA: Diagnosis not present

## 2024-03-24 DIAGNOSIS — J9621 Acute and chronic respiratory failure with hypoxia: Secondary | ICD-10-CM | POA: Diagnosis not present

## 2024-03-24 DIAGNOSIS — I5033 Acute on chronic diastolic (congestive) heart failure: Secondary | ICD-10-CM | POA: Diagnosis not present

## 2024-03-24 DIAGNOSIS — J90 Pleural effusion, not elsewhere classified: Secondary | ICD-10-CM | POA: Diagnosis not present

## 2024-03-24 DIAGNOSIS — K922 Gastrointestinal hemorrhage, unspecified: Secondary | ICD-10-CM | POA: Diagnosis not present

## 2024-03-24 DIAGNOSIS — Z8719 Personal history of other diseases of the digestive system: Secondary | ICD-10-CM | POA: Diagnosis not present

## 2024-03-24 DIAGNOSIS — D479 Neoplasm of uncertain behavior of lymphoid, hematopoietic and related tissue, unspecified: Secondary | ICD-10-CM | POA: Diagnosis not present

## 2024-03-25 DIAGNOSIS — I5033 Acute on chronic diastolic (congestive) heart failure: Secondary | ICD-10-CM | POA: Diagnosis not present

## 2024-03-25 DIAGNOSIS — J449 Chronic obstructive pulmonary disease, unspecified: Secondary | ICD-10-CM | POA: Diagnosis not present

## 2024-03-25 DIAGNOSIS — K922 Gastrointestinal hemorrhage, unspecified: Secondary | ICD-10-CM | POA: Diagnosis not present

## 2024-03-25 DIAGNOSIS — N179 Acute kidney failure, unspecified: Secondary | ICD-10-CM | POA: Diagnosis not present

## 2024-03-25 DIAGNOSIS — C9110 Chronic lymphocytic leukemia of B-cell type not having achieved remission: Secondary | ICD-10-CM | POA: Diagnosis not present

## 2024-03-25 DIAGNOSIS — J9621 Acute and chronic respiratory failure with hypoxia: Secondary | ICD-10-CM | POA: Diagnosis not present

## 2024-03-25 DIAGNOSIS — D62 Acute posthemorrhagic anemia: Secondary | ICD-10-CM | POA: Diagnosis not present

## 2024-03-25 DIAGNOSIS — D479 Neoplasm of uncertain behavior of lymphoid, hematopoietic and related tissue, unspecified: Secondary | ICD-10-CM | POA: Diagnosis not present

## 2024-03-25 DIAGNOSIS — J9601 Acute respiratory failure with hypoxia: Secondary | ICD-10-CM | POA: Diagnosis not present

## 2024-03-25 DIAGNOSIS — Z8719 Personal history of other diseases of the digestive system: Secondary | ICD-10-CM | POA: Diagnosis not present

## 2024-03-25 DIAGNOSIS — I5031 Acute diastolic (congestive) heart failure: Secondary | ICD-10-CM | POA: Diagnosis not present

## 2024-03-25 DIAGNOSIS — N189 Chronic kidney disease, unspecified: Secondary | ICD-10-CM | POA: Diagnosis not present

## 2024-03-25 DIAGNOSIS — J90 Pleural effusion, not elsewhere classified: Secondary | ICD-10-CM | POA: Diagnosis not present

## 2024-03-26 DIAGNOSIS — C9110 Chronic lymphocytic leukemia of B-cell type not having achieved remission: Secondary | ICD-10-CM | POA: Diagnosis not present

## 2024-03-26 DIAGNOSIS — J9601 Acute respiratory failure with hypoxia: Secondary | ICD-10-CM | POA: Diagnosis not present

## 2024-03-26 DIAGNOSIS — Z8719 Personal history of other diseases of the digestive system: Secondary | ICD-10-CM | POA: Diagnosis not present

## 2024-03-26 DIAGNOSIS — I5033 Acute on chronic diastolic (congestive) heart failure: Secondary | ICD-10-CM | POA: Diagnosis not present

## 2024-03-26 DIAGNOSIS — K922 Gastrointestinal hemorrhage, unspecified: Secondary | ICD-10-CM | POA: Diagnosis not present

## 2024-03-26 DIAGNOSIS — J9621 Acute and chronic respiratory failure with hypoxia: Secondary | ICD-10-CM | POA: Diagnosis not present

## 2024-03-26 DIAGNOSIS — D62 Acute posthemorrhagic anemia: Secondary | ICD-10-CM | POA: Diagnosis not present

## 2024-03-26 DIAGNOSIS — N189 Chronic kidney disease, unspecified: Secondary | ICD-10-CM | POA: Diagnosis not present

## 2024-03-26 DIAGNOSIS — J449 Chronic obstructive pulmonary disease, unspecified: Secondary | ICD-10-CM | POA: Diagnosis not present

## 2024-03-26 DIAGNOSIS — N179 Acute kidney failure, unspecified: Secondary | ICD-10-CM | POA: Diagnosis not present

## 2024-03-26 DIAGNOSIS — D479 Neoplasm of uncertain behavior of lymphoid, hematopoietic and related tissue, unspecified: Secondary | ICD-10-CM | POA: Diagnosis not present

## 2024-03-26 DIAGNOSIS — J90 Pleural effusion, not elsewhere classified: Secondary | ICD-10-CM | POA: Diagnosis not present

## 2024-03-26 DIAGNOSIS — I5031 Acute diastolic (congestive) heart failure: Secondary | ICD-10-CM | POA: Diagnosis not present

## 2024-03-27 ENCOUNTER — Telehealth: Payer: Self-pay | Admitting: Oncology

## 2024-03-27 DIAGNOSIS — Z8719 Personal history of other diseases of the digestive system: Secondary | ICD-10-CM | POA: Diagnosis not present

## 2024-03-27 DIAGNOSIS — J90 Pleural effusion, not elsewhere classified: Secondary | ICD-10-CM | POA: Diagnosis not present

## 2024-03-27 DIAGNOSIS — J449 Chronic obstructive pulmonary disease, unspecified: Secondary | ICD-10-CM | POA: Diagnosis not present

## 2024-03-27 DIAGNOSIS — J9621 Acute and chronic respiratory failure with hypoxia: Secondary | ICD-10-CM | POA: Diagnosis not present

## 2024-03-27 DIAGNOSIS — C9110 Chronic lymphocytic leukemia of B-cell type not having achieved remission: Secondary | ICD-10-CM | POA: Diagnosis not present

## 2024-03-27 DIAGNOSIS — D62 Acute posthemorrhagic anemia: Secondary | ICD-10-CM | POA: Diagnosis not present

## 2024-03-27 DIAGNOSIS — D479 Neoplasm of uncertain behavior of lymphoid, hematopoietic and related tissue, unspecified: Secondary | ICD-10-CM | POA: Diagnosis not present

## 2024-03-27 DIAGNOSIS — N179 Acute kidney failure, unspecified: Secondary | ICD-10-CM | POA: Diagnosis not present

## 2024-03-27 DIAGNOSIS — J9601 Acute respiratory failure with hypoxia: Secondary | ICD-10-CM | POA: Diagnosis not present

## 2024-03-27 DIAGNOSIS — N189 Chronic kidney disease, unspecified: Secondary | ICD-10-CM | POA: Diagnosis not present

## 2024-03-27 DIAGNOSIS — I5033 Acute on chronic diastolic (congestive) heart failure: Secondary | ICD-10-CM | POA: Diagnosis not present

## 2024-03-27 DIAGNOSIS — K922 Gastrointestinal hemorrhage, unspecified: Secondary | ICD-10-CM | POA: Diagnosis not present

## 2024-03-27 NOTE — Telephone Encounter (Signed)
 Olam Coca, NP from [facility] contacted our office to schedule an appt for pt to establish.  Spoke with her and she requests pt be seen 6 weeks from now for a visit with Dx CLL. Provided her with our fax number to send us  information/records on pt.

## 2024-03-28 DIAGNOSIS — J90 Pleural effusion, not elsewhere classified: Secondary | ICD-10-CM | POA: Diagnosis not present

## 2024-03-28 DIAGNOSIS — N179 Acute kidney failure, unspecified: Secondary | ICD-10-CM | POA: Diagnosis not present

## 2024-03-28 DIAGNOSIS — D479 Neoplasm of uncertain behavior of lymphoid, hematopoietic and related tissue, unspecified: Secondary | ICD-10-CM | POA: Diagnosis not present

## 2024-03-28 DIAGNOSIS — I5033 Acute on chronic diastolic (congestive) heart failure: Secondary | ICD-10-CM | POA: Diagnosis not present

## 2024-03-28 DIAGNOSIS — J9601 Acute respiratory failure with hypoxia: Secondary | ICD-10-CM | POA: Diagnosis not present

## 2024-03-28 DIAGNOSIS — C9110 Chronic lymphocytic leukemia of B-cell type not having achieved remission: Secondary | ICD-10-CM | POA: Diagnosis not present

## 2024-03-28 DIAGNOSIS — J449 Chronic obstructive pulmonary disease, unspecified: Secondary | ICD-10-CM | POA: Diagnosis not present

## 2024-03-28 DIAGNOSIS — K922 Gastrointestinal hemorrhage, unspecified: Secondary | ICD-10-CM | POA: Diagnosis not present

## 2024-03-28 DIAGNOSIS — N189 Chronic kidney disease, unspecified: Secondary | ICD-10-CM | POA: Diagnosis not present

## 2024-03-28 DIAGNOSIS — J9621 Acute and chronic respiratory failure with hypoxia: Secondary | ICD-10-CM | POA: Diagnosis not present

## 2024-03-29 DIAGNOSIS — I5033 Acute on chronic diastolic (congestive) heart failure: Secondary | ICD-10-CM | POA: Diagnosis not present

## 2024-03-29 DIAGNOSIS — J449 Chronic obstructive pulmonary disease, unspecified: Secondary | ICD-10-CM | POA: Diagnosis not present

## 2024-03-29 DIAGNOSIS — I5031 Acute diastolic (congestive) heart failure: Secondary | ICD-10-CM | POA: Diagnosis not present

## 2024-03-29 DIAGNOSIS — N189 Chronic kidney disease, unspecified: Secondary | ICD-10-CM | POA: Diagnosis not present

## 2024-03-29 DIAGNOSIS — K922 Gastrointestinal hemorrhage, unspecified: Secondary | ICD-10-CM | POA: Diagnosis not present

## 2024-03-29 DIAGNOSIS — D479 Neoplasm of uncertain behavior of lymphoid, hematopoietic and related tissue, unspecified: Secondary | ICD-10-CM | POA: Diagnosis not present

## 2024-03-29 DIAGNOSIS — J9621 Acute and chronic respiratory failure with hypoxia: Secondary | ICD-10-CM | POA: Diagnosis not present

## 2024-03-29 DIAGNOSIS — C9110 Chronic lymphocytic leukemia of B-cell type not having achieved remission: Secondary | ICD-10-CM | POA: Diagnosis not present

## 2024-03-30 DIAGNOSIS — I5033 Acute on chronic diastolic (congestive) heart failure: Secondary | ICD-10-CM | POA: Diagnosis not present

## 2024-03-30 DIAGNOSIS — D479 Neoplasm of uncertain behavior of lymphoid, hematopoietic and related tissue, unspecified: Secondary | ICD-10-CM | POA: Diagnosis not present

## 2024-03-30 DIAGNOSIS — N189 Chronic kidney disease, unspecified: Secondary | ICD-10-CM | POA: Diagnosis not present

## 2024-03-30 DIAGNOSIS — J9621 Acute and chronic respiratory failure with hypoxia: Secondary | ICD-10-CM | POA: Diagnosis not present

## 2024-03-30 DIAGNOSIS — K922 Gastrointestinal hemorrhage, unspecified: Secondary | ICD-10-CM | POA: Diagnosis not present

## 2024-03-30 DIAGNOSIS — J449 Chronic obstructive pulmonary disease, unspecified: Secondary | ICD-10-CM | POA: Diagnosis not present

## 2024-03-30 DIAGNOSIS — C9110 Chronic lymphocytic leukemia of B-cell type not having achieved remission: Secondary | ICD-10-CM | POA: Diagnosis not present

## 2024-03-31 DIAGNOSIS — N189 Chronic kidney disease, unspecified: Secondary | ICD-10-CM | POA: Diagnosis not present

## 2024-03-31 DIAGNOSIS — J449 Chronic obstructive pulmonary disease, unspecified: Secondary | ICD-10-CM | POA: Diagnosis not present

## 2024-03-31 DIAGNOSIS — D479 Neoplasm of uncertain behavior of lymphoid, hematopoietic and related tissue, unspecified: Secondary | ICD-10-CM | POA: Diagnosis not present

## 2024-03-31 DIAGNOSIS — J9621 Acute and chronic respiratory failure with hypoxia: Secondary | ICD-10-CM | POA: Diagnosis not present

## 2024-03-31 DIAGNOSIS — I5033 Acute on chronic diastolic (congestive) heart failure: Secondary | ICD-10-CM | POA: Diagnosis not present

## 2024-03-31 DIAGNOSIS — K922 Gastrointestinal hemorrhage, unspecified: Secondary | ICD-10-CM | POA: Diagnosis not present

## 2024-03-31 DIAGNOSIS — C9110 Chronic lymphocytic leukemia of B-cell type not having achieved remission: Secondary | ICD-10-CM | POA: Diagnosis not present

## 2024-04-01 DIAGNOSIS — J449 Chronic obstructive pulmonary disease, unspecified: Secondary | ICD-10-CM | POA: Diagnosis not present

## 2024-04-01 DIAGNOSIS — K922 Gastrointestinal hemorrhage, unspecified: Secondary | ICD-10-CM | POA: Diagnosis not present

## 2024-04-01 DIAGNOSIS — D479 Neoplasm of uncertain behavior of lymphoid, hematopoietic and related tissue, unspecified: Secondary | ICD-10-CM | POA: Diagnosis not present

## 2024-04-01 DIAGNOSIS — C9110 Chronic lymphocytic leukemia of B-cell type not having achieved remission: Secondary | ICD-10-CM | POA: Diagnosis not present

## 2024-04-01 DIAGNOSIS — N189 Chronic kidney disease, unspecified: Secondary | ICD-10-CM | POA: Diagnosis not present

## 2024-04-01 DIAGNOSIS — I5033 Acute on chronic diastolic (congestive) heart failure: Secondary | ICD-10-CM | POA: Diagnosis not present

## 2024-04-01 DIAGNOSIS — I5031 Acute diastolic (congestive) heart failure: Secondary | ICD-10-CM | POA: Diagnosis not present

## 2024-04-01 DIAGNOSIS — J9621 Acute and chronic respiratory failure with hypoxia: Secondary | ICD-10-CM | POA: Diagnosis not present

## 2024-04-01 DIAGNOSIS — I5032 Chronic diastolic (congestive) heart failure: Secondary | ICD-10-CM | POA: Diagnosis not present

## 2024-04-02 DIAGNOSIS — K922 Gastrointestinal hemorrhage, unspecified: Secondary | ICD-10-CM | POA: Diagnosis not present

## 2024-04-02 DIAGNOSIS — J449 Chronic obstructive pulmonary disease, unspecified: Secondary | ICD-10-CM | POA: Diagnosis not present

## 2024-04-02 DIAGNOSIS — C9110 Chronic lymphocytic leukemia of B-cell type not having achieved remission: Secondary | ICD-10-CM | POA: Diagnosis not present

## 2024-04-02 DIAGNOSIS — J9621 Acute and chronic respiratory failure with hypoxia: Secondary | ICD-10-CM | POA: Diagnosis not present

## 2024-04-02 DIAGNOSIS — I5033 Acute on chronic diastolic (congestive) heart failure: Secondary | ICD-10-CM | POA: Diagnosis not present

## 2024-04-02 DIAGNOSIS — N189 Chronic kidney disease, unspecified: Secondary | ICD-10-CM | POA: Diagnosis not present

## 2024-04-02 DIAGNOSIS — D479 Neoplasm of uncertain behavior of lymphoid, hematopoietic and related tissue, unspecified: Secondary | ICD-10-CM | POA: Diagnosis not present

## 2024-04-03 DIAGNOSIS — K922 Gastrointestinal hemorrhage, unspecified: Secondary | ICD-10-CM | POA: Diagnosis not present

## 2024-04-03 DIAGNOSIS — J449 Chronic obstructive pulmonary disease, unspecified: Secondary | ICD-10-CM | POA: Diagnosis not present

## 2024-04-03 DIAGNOSIS — C9110 Chronic lymphocytic leukemia of B-cell type not having achieved remission: Secondary | ICD-10-CM | POA: Diagnosis not present

## 2024-04-03 DIAGNOSIS — N189 Chronic kidney disease, unspecified: Secondary | ICD-10-CM | POA: Diagnosis not present

## 2024-04-03 DIAGNOSIS — J9621 Acute and chronic respiratory failure with hypoxia: Secondary | ICD-10-CM | POA: Diagnosis not present

## 2024-04-03 DIAGNOSIS — D479 Neoplasm of uncertain behavior of lymphoid, hematopoietic and related tissue, unspecified: Secondary | ICD-10-CM | POA: Diagnosis not present

## 2024-04-03 DIAGNOSIS — I5033 Acute on chronic diastolic (congestive) heart failure: Secondary | ICD-10-CM | POA: Diagnosis not present

## 2024-04-04 DIAGNOSIS — K922 Gastrointestinal hemorrhage, unspecified: Secondary | ICD-10-CM | POA: Diagnosis not present

## 2024-04-06 DIAGNOSIS — R52 Pain, unspecified: Secondary | ICD-10-CM | POA: Diagnosis not present

## 2024-04-06 DIAGNOSIS — J9621 Acute and chronic respiratory failure with hypoxia: Secondary | ICD-10-CM | POA: Diagnosis not present

## 2024-04-06 DIAGNOSIS — K922 Gastrointestinal hemorrhage, unspecified: Secondary | ICD-10-CM | POA: Diagnosis not present

## 2024-04-06 DIAGNOSIS — I5033 Acute on chronic diastolic (congestive) heart failure: Secondary | ICD-10-CM | POA: Diagnosis not present

## 2024-04-06 DIAGNOSIS — Z515 Encounter for palliative care: Secondary | ICD-10-CM | POA: Diagnosis not present

## 2024-04-06 DIAGNOSIS — C9110 Chronic lymphocytic leukemia of B-cell type not having achieved remission: Secondary | ICD-10-CM | POA: Diagnosis not present

## 2024-04-06 DIAGNOSIS — R0600 Dyspnea, unspecified: Secondary | ICD-10-CM | POA: Diagnosis not present

## 2024-04-07 DIAGNOSIS — C9110 Chronic lymphocytic leukemia of B-cell type not having achieved remission: Secondary | ICD-10-CM | POA: Diagnosis not present

## 2024-04-07 DIAGNOSIS — K922 Gastrointestinal hemorrhage, unspecified: Secondary | ICD-10-CM | POA: Diagnosis not present

## 2024-04-07 DIAGNOSIS — I5033 Acute on chronic diastolic (congestive) heart failure: Secondary | ICD-10-CM | POA: Diagnosis not present

## 2024-04-07 DIAGNOSIS — R0600 Dyspnea, unspecified: Secondary | ICD-10-CM | POA: Diagnosis not present

## 2024-04-07 DIAGNOSIS — R52 Pain, unspecified: Secondary | ICD-10-CM | POA: Diagnosis not present

## 2024-04-07 DIAGNOSIS — Z515 Encounter for palliative care: Secondary | ICD-10-CM | POA: Diagnosis not present

## 2024-04-07 DIAGNOSIS — J9621 Acute and chronic respiratory failure with hypoxia: Secondary | ICD-10-CM | POA: Diagnosis not present

## 2024-04-08 DIAGNOSIS — K922 Gastrointestinal hemorrhage, unspecified: Secondary | ICD-10-CM | POA: Diagnosis not present

## 2024-04-09 DIAGNOSIS — C9110 Chronic lymphocytic leukemia of B-cell type not having achieved remission: Secondary | ICD-10-CM | POA: Diagnosis not present

## 2024-04-09 DIAGNOSIS — I5031 Acute diastolic (congestive) heart failure: Secondary | ICD-10-CM | POA: Diagnosis not present

## 2024-04-09 DIAGNOSIS — J431 Panlobular emphysema: Secondary | ICD-10-CM | POA: Diagnosis not present

## 2024-04-09 DIAGNOSIS — K922 Gastrointestinal hemorrhage, unspecified: Secondary | ICD-10-CM | POA: Diagnosis not present

## 2024-04-09 DIAGNOSIS — D479 Neoplasm of uncertain behavior of lymphoid, hematopoietic and related tissue, unspecified: Secondary | ICD-10-CM | POA: Diagnosis not present

## 2024-04-09 DIAGNOSIS — Z515 Encounter for palliative care: Secondary | ICD-10-CM | POA: Diagnosis not present

## 2024-04-10 DIAGNOSIS — J431 Panlobular emphysema: Secondary | ICD-10-CM | POA: Diagnosis not present

## 2024-04-10 DIAGNOSIS — D479 Neoplasm of uncertain behavior of lymphoid, hematopoietic and related tissue, unspecified: Secondary | ICD-10-CM | POA: Diagnosis not present

## 2024-04-10 DIAGNOSIS — C9110 Chronic lymphocytic leukemia of B-cell type not having achieved remission: Secondary | ICD-10-CM | POA: Diagnosis not present

## 2024-04-10 DIAGNOSIS — K922 Gastrointestinal hemorrhage, unspecified: Secondary | ICD-10-CM | POA: Diagnosis not present

## 2024-04-10 DIAGNOSIS — I5031 Acute diastolic (congestive) heart failure: Secondary | ICD-10-CM | POA: Diagnosis not present

## 2024-04-10 DIAGNOSIS — Z515 Encounter for palliative care: Secondary | ICD-10-CM | POA: Diagnosis not present

## 2024-04-11 DIAGNOSIS — K5790 Diverticulosis of intestine, part unspecified, without perforation or abscess without bleeding: Secondary | ICD-10-CM | POA: Diagnosis not present

## 2024-04-11 DIAGNOSIS — Z992 Dependence on renal dialysis: Secondary | ICD-10-CM | POA: Diagnosis not present

## 2024-04-11 DIAGNOSIS — I5031 Acute diastolic (congestive) heart failure: Secondary | ICD-10-CM | POA: Diagnosis not present

## 2024-04-11 DIAGNOSIS — N179 Acute kidney failure, unspecified: Secondary | ICD-10-CM | POA: Diagnosis not present

## 2024-04-11 DIAGNOSIS — D479 Neoplasm of uncertain behavior of lymphoid, hematopoietic and related tissue, unspecified: Secondary | ICD-10-CM | POA: Diagnosis not present

## 2024-04-11 DIAGNOSIS — C9110 Chronic lymphocytic leukemia of B-cell type not having achieved remission: Secondary | ICD-10-CM | POA: Diagnosis not present

## 2024-04-11 DIAGNOSIS — N1831 Chronic kidney disease, stage 3a: Secondary | ICD-10-CM | POA: Diagnosis not present

## 2024-04-11 DIAGNOSIS — Z515 Encounter for palliative care: Secondary | ICD-10-CM | POA: Diagnosis not present

## 2024-04-11 DIAGNOSIS — N186 End stage renal disease: Secondary | ICD-10-CM | POA: Diagnosis not present

## 2024-04-11 DIAGNOSIS — J441 Chronic obstructive pulmonary disease with (acute) exacerbation: Secondary | ICD-10-CM | POA: Diagnosis not present

## 2024-04-11 DIAGNOSIS — K922 Gastrointestinal hemorrhage, unspecified: Secondary | ICD-10-CM | POA: Diagnosis not present

## 2024-04-11 DIAGNOSIS — J9621 Acute and chronic respiratory failure with hypoxia: Secondary | ICD-10-CM | POA: Diagnosis not present

## 2024-04-11 DIAGNOSIS — J90 Pleural effusion, not elsewhere classified: Secondary | ICD-10-CM | POA: Diagnosis not present

## 2024-04-11 DIAGNOSIS — R0600 Dyspnea, unspecified: Secondary | ICD-10-CM | POA: Diagnosis not present

## 2024-04-11 DIAGNOSIS — D62 Acute posthemorrhagic anemia: Secondary | ICD-10-CM | POA: Diagnosis not present

## 2024-04-12 DIAGNOSIS — N1831 Chronic kidney disease, stage 3a: Secondary | ICD-10-CM | POA: Diagnosis not present

## 2024-04-12 DIAGNOSIS — R0600 Dyspnea, unspecified: Secondary | ICD-10-CM | POA: Diagnosis not present

## 2024-04-12 DIAGNOSIS — J90 Pleural effusion, not elsewhere classified: Secondary | ICD-10-CM | POA: Diagnosis not present

## 2024-04-12 DIAGNOSIS — D479 Neoplasm of uncertain behavior of lymphoid, hematopoietic and related tissue, unspecified: Secondary | ICD-10-CM | POA: Diagnosis not present

## 2024-04-12 DIAGNOSIS — N179 Acute kidney failure, unspecified: Secondary | ICD-10-CM | POA: Diagnosis not present

## 2024-04-12 DIAGNOSIS — J441 Chronic obstructive pulmonary disease with (acute) exacerbation: Secondary | ICD-10-CM | POA: Diagnosis not present

## 2024-04-12 DIAGNOSIS — N186 End stage renal disease: Secondary | ICD-10-CM | POA: Diagnosis not present

## 2024-04-12 DIAGNOSIS — K5790 Diverticulosis of intestine, part unspecified, without perforation or abscess without bleeding: Secondary | ICD-10-CM | POA: Diagnosis not present

## 2024-04-12 DIAGNOSIS — Z992 Dependence on renal dialysis: Secondary | ICD-10-CM | POA: Diagnosis not present

## 2024-04-12 DIAGNOSIS — K922 Gastrointestinal hemorrhage, unspecified: Secondary | ICD-10-CM | POA: Diagnosis not present

## 2024-04-12 DIAGNOSIS — D62 Acute posthemorrhagic anemia: Secondary | ICD-10-CM | POA: Diagnosis not present

## 2024-04-12 DIAGNOSIS — J9621 Acute and chronic respiratory failure with hypoxia: Secondary | ICD-10-CM | POA: Diagnosis not present

## 2024-04-12 DIAGNOSIS — C9110 Chronic lymphocytic leukemia of B-cell type not having achieved remission: Secondary | ICD-10-CM | POA: Diagnosis not present

## 2024-04-12 DIAGNOSIS — I5031 Acute diastolic (congestive) heart failure: Secondary | ICD-10-CM | POA: Diagnosis not present

## 2024-04-12 DIAGNOSIS — Z515 Encounter for palliative care: Secondary | ICD-10-CM | POA: Diagnosis not present

## 2024-04-13 DIAGNOSIS — N186 End stage renal disease: Secondary | ICD-10-CM | POA: Diagnosis not present

## 2024-04-13 DIAGNOSIS — K922 Gastrointestinal hemorrhage, unspecified: Secondary | ICD-10-CM | POA: Diagnosis not present

## 2024-04-13 DIAGNOSIS — R0600 Dyspnea, unspecified: Secondary | ICD-10-CM | POA: Diagnosis not present

## 2024-04-13 DIAGNOSIS — K5790 Diverticulosis of intestine, part unspecified, without perforation or abscess without bleeding: Secondary | ICD-10-CM | POA: Diagnosis not present

## 2024-04-13 DIAGNOSIS — J9621 Acute and chronic respiratory failure with hypoxia: Secondary | ICD-10-CM | POA: Diagnosis not present

## 2024-04-13 DIAGNOSIS — Z992 Dependence on renal dialysis: Secondary | ICD-10-CM | POA: Diagnosis not present

## 2024-04-13 DIAGNOSIS — C9110 Chronic lymphocytic leukemia of B-cell type not having achieved remission: Secondary | ICD-10-CM | POA: Diagnosis not present

## 2024-04-13 DIAGNOSIS — D62 Acute posthemorrhagic anemia: Secondary | ICD-10-CM | POA: Diagnosis not present

## 2024-04-13 DIAGNOSIS — N179 Acute kidney failure, unspecified: Secondary | ICD-10-CM | POA: Diagnosis not present

## 2024-04-13 DIAGNOSIS — I5031 Acute diastolic (congestive) heart failure: Secondary | ICD-10-CM | POA: Diagnosis not present

## 2024-04-13 DIAGNOSIS — J441 Chronic obstructive pulmonary disease with (acute) exacerbation: Secondary | ICD-10-CM | POA: Diagnosis not present

## 2024-04-13 DIAGNOSIS — J90 Pleural effusion, not elsewhere classified: Secondary | ICD-10-CM | POA: Diagnosis not present

## 2024-04-13 DIAGNOSIS — N1831 Chronic kidney disease, stage 3a: Secondary | ICD-10-CM | POA: Diagnosis not present

## 2024-04-13 DIAGNOSIS — Z515 Encounter for palliative care: Secondary | ICD-10-CM | POA: Diagnosis not present

## 2024-04-13 DIAGNOSIS — D479 Neoplasm of uncertain behavior of lymphoid, hematopoietic and related tissue, unspecified: Secondary | ICD-10-CM | POA: Diagnosis not present

## 2024-04-14 DIAGNOSIS — N186 End stage renal disease: Secondary | ICD-10-CM | POA: Diagnosis not present

## 2024-04-14 DIAGNOSIS — K922 Gastrointestinal hemorrhage, unspecified: Secondary | ICD-10-CM | POA: Diagnosis not present

## 2024-04-14 DIAGNOSIS — J441 Chronic obstructive pulmonary disease with (acute) exacerbation: Secondary | ICD-10-CM | POA: Diagnosis not present

## 2024-04-14 DIAGNOSIS — K5790 Diverticulosis of intestine, part unspecified, without perforation or abscess without bleeding: Secondary | ICD-10-CM | POA: Diagnosis not present

## 2024-04-14 DIAGNOSIS — Z992 Dependence on renal dialysis: Secondary | ICD-10-CM | POA: Diagnosis not present

## 2024-04-14 DIAGNOSIS — D479 Neoplasm of uncertain behavior of lymphoid, hematopoietic and related tissue, unspecified: Secondary | ICD-10-CM | POA: Diagnosis not present

## 2024-04-14 DIAGNOSIS — J9621 Acute and chronic respiratory failure with hypoxia: Secondary | ICD-10-CM | POA: Diagnosis not present

## 2024-04-14 DIAGNOSIS — I5031 Acute diastolic (congestive) heart failure: Secondary | ICD-10-CM | POA: Diagnosis not present

## 2024-04-15 DIAGNOSIS — J441 Chronic obstructive pulmonary disease with (acute) exacerbation: Secondary | ICD-10-CM | POA: Diagnosis not present

## 2024-04-15 DIAGNOSIS — J9621 Acute and chronic respiratory failure with hypoxia: Secondary | ICD-10-CM | POA: Diagnosis not present

## 2024-04-15 DIAGNOSIS — Z992 Dependence on renal dialysis: Secondary | ICD-10-CM | POA: Diagnosis not present

## 2024-04-15 DIAGNOSIS — K5790 Diverticulosis of intestine, part unspecified, without perforation or abscess without bleeding: Secondary | ICD-10-CM | POA: Diagnosis not present

## 2024-04-15 DIAGNOSIS — K922 Gastrointestinal hemorrhage, unspecified: Secondary | ICD-10-CM | POA: Diagnosis not present

## 2024-04-15 DIAGNOSIS — N186 End stage renal disease: Secondary | ICD-10-CM | POA: Diagnosis not present

## 2024-04-15 DIAGNOSIS — D479 Neoplasm of uncertain behavior of lymphoid, hematopoietic and related tissue, unspecified: Secondary | ICD-10-CM | POA: Diagnosis not present

## 2024-04-15 DIAGNOSIS — I5031 Acute diastolic (congestive) heart failure: Secondary | ICD-10-CM | POA: Diagnosis not present

## 2024-04-16 DIAGNOSIS — J441 Chronic obstructive pulmonary disease with (acute) exacerbation: Secondary | ICD-10-CM | POA: Diagnosis not present

## 2024-04-16 DIAGNOSIS — J9621 Acute and chronic respiratory failure with hypoxia: Secondary | ICD-10-CM | POA: Diagnosis not present

## 2024-04-16 DIAGNOSIS — Z7189 Other specified counseling: Secondary | ICD-10-CM | POA: Diagnosis not present

## 2024-04-16 DIAGNOSIS — C9110 Chronic lymphocytic leukemia of B-cell type not having achieved remission: Secondary | ICD-10-CM | POA: Diagnosis not present

## 2024-04-16 DIAGNOSIS — Z515 Encounter for palliative care: Secondary | ICD-10-CM | POA: Diagnosis not present

## 2024-04-16 DIAGNOSIS — K5790 Diverticulosis of intestine, part unspecified, without perforation or abscess without bleeding: Secondary | ICD-10-CM | POA: Diagnosis not present

## 2024-04-16 DIAGNOSIS — N186 End stage renal disease: Secondary | ICD-10-CM | POA: Diagnosis not present

## 2024-04-16 DIAGNOSIS — N189 Chronic kidney disease, unspecified: Secondary | ICD-10-CM | POA: Diagnosis not present

## 2024-04-16 DIAGNOSIS — K922 Gastrointestinal hemorrhage, unspecified: Secondary | ICD-10-CM | POA: Diagnosis not present

## 2024-04-16 DIAGNOSIS — Z992 Dependence on renal dialysis: Secondary | ICD-10-CM | POA: Diagnosis not present

## 2024-04-16 DIAGNOSIS — D479 Neoplasm of uncertain behavior of lymphoid, hematopoietic and related tissue, unspecified: Secondary | ICD-10-CM | POA: Diagnosis not present

## 2024-04-16 DIAGNOSIS — I5031 Acute diastolic (congestive) heart failure: Secondary | ICD-10-CM | POA: Diagnosis not present

## 2024-04-16 DIAGNOSIS — I5033 Acute on chronic diastolic (congestive) heart failure: Secondary | ICD-10-CM | POA: Diagnosis not present

## 2024-04-17 DIAGNOSIS — N186 End stage renal disease: Secondary | ICD-10-CM | POA: Diagnosis not present

## 2024-04-17 DIAGNOSIS — I5031 Acute diastolic (congestive) heart failure: Secondary | ICD-10-CM | POA: Diagnosis not present

## 2024-04-17 DIAGNOSIS — K922 Gastrointestinal hemorrhage, unspecified: Secondary | ICD-10-CM | POA: Diagnosis not present

## 2024-04-17 DIAGNOSIS — Z992 Dependence on renal dialysis: Secondary | ICD-10-CM | POA: Diagnosis not present

## 2024-04-17 DIAGNOSIS — D479 Neoplasm of uncertain behavior of lymphoid, hematopoietic and related tissue, unspecified: Secondary | ICD-10-CM | POA: Diagnosis not present

## 2024-04-17 DIAGNOSIS — K5790 Diverticulosis of intestine, part unspecified, without perforation or abscess without bleeding: Secondary | ICD-10-CM | POA: Diagnosis not present

## 2024-04-17 DIAGNOSIS — J441 Chronic obstructive pulmonary disease with (acute) exacerbation: Secondary | ICD-10-CM | POA: Diagnosis not present

## 2024-04-17 DIAGNOSIS — J9621 Acute and chronic respiratory failure with hypoxia: Secondary | ICD-10-CM | POA: Diagnosis not present

## 2024-04-18 DIAGNOSIS — Z992 Dependence on renal dialysis: Secondary | ICD-10-CM | POA: Diagnosis not present

## 2024-04-18 DIAGNOSIS — J441 Chronic obstructive pulmonary disease with (acute) exacerbation: Secondary | ICD-10-CM | POA: Diagnosis not present

## 2024-04-18 DIAGNOSIS — R0600 Dyspnea, unspecified: Secondary | ICD-10-CM | POA: Diagnosis not present

## 2024-04-18 DIAGNOSIS — K922 Gastrointestinal hemorrhage, unspecified: Secondary | ICD-10-CM | POA: Diagnosis not present

## 2024-04-18 DIAGNOSIS — C9110 Chronic lymphocytic leukemia of B-cell type not having achieved remission: Secondary | ICD-10-CM | POA: Diagnosis not present

## 2024-04-18 DIAGNOSIS — D62 Acute posthemorrhagic anemia: Secondary | ICD-10-CM | POA: Diagnosis not present

## 2024-04-18 DIAGNOSIS — Z515 Encounter for palliative care: Secondary | ICD-10-CM | POA: Diagnosis not present

## 2024-04-18 DIAGNOSIS — N179 Acute kidney failure, unspecified: Secondary | ICD-10-CM | POA: Diagnosis not present

## 2024-04-18 DIAGNOSIS — I5031 Acute diastolic (congestive) heart failure: Secondary | ICD-10-CM | POA: Diagnosis not present

## 2024-04-18 DIAGNOSIS — J9621 Acute and chronic respiratory failure with hypoxia: Secondary | ICD-10-CM | POA: Diagnosis not present

## 2024-04-18 DIAGNOSIS — N186 End stage renal disease: Secondary | ICD-10-CM | POA: Diagnosis not present

## 2024-04-18 DIAGNOSIS — J90 Pleural effusion, not elsewhere classified: Secondary | ICD-10-CM | POA: Diagnosis not present

## 2024-04-18 DIAGNOSIS — N1831 Chronic kidney disease, stage 3a: Secondary | ICD-10-CM | POA: Diagnosis not present

## 2024-04-18 DIAGNOSIS — D479 Neoplasm of uncertain behavior of lymphoid, hematopoietic and related tissue, unspecified: Secondary | ICD-10-CM | POA: Diagnosis not present

## 2024-04-18 DIAGNOSIS — K5790 Diverticulosis of intestine, part unspecified, without perforation or abscess without bleeding: Secondary | ICD-10-CM | POA: Diagnosis not present

## 2024-04-19 DIAGNOSIS — N186 End stage renal disease: Secondary | ICD-10-CM | POA: Diagnosis not present

## 2024-04-19 DIAGNOSIS — Z992 Dependence on renal dialysis: Secondary | ICD-10-CM | POA: Diagnosis not present

## 2024-04-19 DIAGNOSIS — K922 Gastrointestinal hemorrhage, unspecified: Secondary | ICD-10-CM | POA: Diagnosis not present

## 2024-04-19 DIAGNOSIS — I5031 Acute diastolic (congestive) heart failure: Secondary | ICD-10-CM | POA: Diagnosis not present

## 2024-04-19 DIAGNOSIS — D479 Neoplasm of uncertain behavior of lymphoid, hematopoietic and related tissue, unspecified: Secondary | ICD-10-CM | POA: Diagnosis not present

## 2024-04-19 DIAGNOSIS — K5790 Diverticulosis of intestine, part unspecified, without perforation or abscess without bleeding: Secondary | ICD-10-CM | POA: Diagnosis not present

## 2024-04-19 DIAGNOSIS — J9621 Acute and chronic respiratory failure with hypoxia: Secondary | ICD-10-CM | POA: Diagnosis not present

## 2024-04-19 DIAGNOSIS — J441 Chronic obstructive pulmonary disease with (acute) exacerbation: Secondary | ICD-10-CM | POA: Diagnosis not present

## 2024-04-20 DIAGNOSIS — I480 Paroxysmal atrial fibrillation: Secondary | ICD-10-CM | POA: Diagnosis not present

## 2024-04-20 DIAGNOSIS — I5033 Acute on chronic diastolic (congestive) heart failure: Secondary | ICD-10-CM | POA: Diagnosis not present

## 2024-04-20 DIAGNOSIS — J449 Chronic obstructive pulmonary disease, unspecified: Secondary | ICD-10-CM | POA: Diagnosis not present

## 2024-04-20 DIAGNOSIS — N179 Acute kidney failure, unspecified: Secondary | ICD-10-CM | POA: Diagnosis not present

## 2024-04-20 DIAGNOSIS — J690 Pneumonitis due to inhalation of food and vomit: Secondary | ICD-10-CM | POA: Diagnosis not present

## 2024-04-20 DIAGNOSIS — J9621 Acute and chronic respiratory failure with hypoxia: Secondary | ICD-10-CM | POA: Diagnosis not present

## 2024-04-20 DIAGNOSIS — K5790 Diverticulosis of intestine, part unspecified, without perforation or abscess without bleeding: Secondary | ICD-10-CM | POA: Diagnosis not present

## 2024-04-20 DIAGNOSIS — J90 Pleural effusion, not elsewhere classified: Secondary | ICD-10-CM | POA: Diagnosis not present

## 2024-04-20 DIAGNOSIS — C9110 Chronic lymphocytic leukemia of B-cell type not having achieved remission: Secondary | ICD-10-CM | POA: Diagnosis not present

## 2024-04-20 DIAGNOSIS — K922 Gastrointestinal hemorrhage, unspecified: Secondary | ICD-10-CM | POA: Diagnosis not present

## 2024-04-20 DIAGNOSIS — N189 Chronic kidney disease, unspecified: Secondary | ICD-10-CM | POA: Diagnosis not present

## 2024-04-20 DIAGNOSIS — N1831 Chronic kidney disease, stage 3a: Secondary | ICD-10-CM | POA: Diagnosis not present

## 2024-04-21 DIAGNOSIS — K5790 Diverticulosis of intestine, part unspecified, without perforation or abscess without bleeding: Secondary | ICD-10-CM | POA: Diagnosis not present

## 2024-04-21 DIAGNOSIS — K922 Gastrointestinal hemorrhage, unspecified: Secondary | ICD-10-CM | POA: Diagnosis not present

## 2024-04-21 DIAGNOSIS — J449 Chronic obstructive pulmonary disease, unspecified: Secondary | ICD-10-CM | POA: Diagnosis not present

## 2024-04-21 DIAGNOSIS — J690 Pneumonitis due to inhalation of food and vomit: Secondary | ICD-10-CM | POA: Diagnosis not present

## 2024-04-21 DIAGNOSIS — I480 Paroxysmal atrial fibrillation: Secondary | ICD-10-CM | POA: Diagnosis not present

## 2024-04-21 DIAGNOSIS — J9621 Acute and chronic respiratory failure with hypoxia: Secondary | ICD-10-CM | POA: Diagnosis not present

## 2024-04-21 DIAGNOSIS — N1831 Chronic kidney disease, stage 3a: Secondary | ICD-10-CM | POA: Diagnosis not present

## 2024-04-21 DIAGNOSIS — N179 Acute kidney failure, unspecified: Secondary | ICD-10-CM | POA: Diagnosis not present

## 2024-04-21 DIAGNOSIS — J90 Pleural effusion, not elsewhere classified: Secondary | ICD-10-CM | POA: Diagnosis not present

## 2024-04-21 DIAGNOSIS — C9110 Chronic lymphocytic leukemia of B-cell type not having achieved remission: Secondary | ICD-10-CM | POA: Diagnosis not present

## 2024-04-21 DIAGNOSIS — I5033 Acute on chronic diastolic (congestive) heart failure: Secondary | ICD-10-CM | POA: Diagnosis not present

## 2024-04-21 DIAGNOSIS — N189 Chronic kidney disease, unspecified: Secondary | ICD-10-CM | POA: Diagnosis not present

## 2024-04-22 DIAGNOSIS — I5033 Acute on chronic diastolic (congestive) heart failure: Secondary | ICD-10-CM | POA: Diagnosis not present

## 2024-04-22 DIAGNOSIS — J9621 Acute and chronic respiratory failure with hypoxia: Secondary | ICD-10-CM | POA: Diagnosis not present

## 2024-04-22 DIAGNOSIS — K922 Gastrointestinal hemorrhage, unspecified: Secondary | ICD-10-CM | POA: Diagnosis not present

## 2024-04-22 DIAGNOSIS — N179 Acute kidney failure, unspecified: Secondary | ICD-10-CM | POA: Diagnosis not present

## 2024-04-22 DIAGNOSIS — J90 Pleural effusion, not elsewhere classified: Secondary | ICD-10-CM | POA: Diagnosis not present

## 2024-04-22 DIAGNOSIS — N1831 Chronic kidney disease, stage 3a: Secondary | ICD-10-CM | POA: Diagnosis not present

## 2024-04-22 DIAGNOSIS — J449 Chronic obstructive pulmonary disease, unspecified: Secondary | ICD-10-CM | POA: Diagnosis not present

## 2024-04-22 DIAGNOSIS — I480 Paroxysmal atrial fibrillation: Secondary | ICD-10-CM | POA: Diagnosis not present

## 2024-04-22 DIAGNOSIS — C9110 Chronic lymphocytic leukemia of B-cell type not having achieved remission: Secondary | ICD-10-CM | POA: Diagnosis not present

## 2024-04-22 DIAGNOSIS — J690 Pneumonitis due to inhalation of food and vomit: Secondary | ICD-10-CM | POA: Diagnosis not present

## 2024-04-22 DIAGNOSIS — N189 Chronic kidney disease, unspecified: Secondary | ICD-10-CM | POA: Diagnosis not present

## 2024-04-22 DIAGNOSIS — K5790 Diverticulosis of intestine, part unspecified, without perforation or abscess without bleeding: Secondary | ICD-10-CM | POA: Diagnosis not present

## 2024-04-23 DIAGNOSIS — N189 Chronic kidney disease, unspecified: Secondary | ICD-10-CM | POA: Diagnosis not present

## 2024-04-23 DIAGNOSIS — I5033 Acute on chronic diastolic (congestive) heart failure: Secondary | ICD-10-CM | POA: Diagnosis not present

## 2024-04-23 DIAGNOSIS — C9110 Chronic lymphocytic leukemia of B-cell type not having achieved remission: Secondary | ICD-10-CM | POA: Diagnosis not present

## 2024-04-23 DIAGNOSIS — K922 Gastrointestinal hemorrhage, unspecified: Secondary | ICD-10-CM | POA: Diagnosis not present

## 2024-04-24 DIAGNOSIS — K922 Gastrointestinal hemorrhage, unspecified: Secondary | ICD-10-CM | POA: Diagnosis not present

## 2024-04-25 DIAGNOSIS — K922 Gastrointestinal hemorrhage, unspecified: Secondary | ICD-10-CM | POA: Diagnosis not present

## 2024-04-26 DIAGNOSIS — K922 Gastrointestinal hemorrhage, unspecified: Secondary | ICD-10-CM | POA: Diagnosis not present

## 2024-04-26 NOTE — Progress Notes (Signed)
 Pt discharging today - he has venetoclax in hand. Continue 200 mg daily until seen by oncologist on 9/17 at [facility].     Shanda Salle, FNP 04/26/2024, 4:42 PM

## 2024-04-29 DIAGNOSIS — J449 Chronic obstructive pulmonary disease, unspecified: Secondary | ICD-10-CM | POA: Diagnosis not present

## 2024-04-29 DIAGNOSIS — N183 Chronic kidney disease, stage 3 unspecified: Secondary | ICD-10-CM | POA: Diagnosis not present

## 2024-04-29 DIAGNOSIS — R5381 Other malaise: Secondary | ICD-10-CM | POA: Diagnosis not present

## 2024-04-29 DIAGNOSIS — M6259 Muscle wasting and atrophy, not elsewhere classified, multiple sites: Secondary | ICD-10-CM | POA: Diagnosis not present

## 2024-05-01 DIAGNOSIS — I5032 Chronic diastolic (congestive) heart failure: Secondary | ICD-10-CM | POA: Diagnosis not present

## 2024-05-01 DIAGNOSIS — R5381 Other malaise: Secondary | ICD-10-CM | POA: Diagnosis not present

## 2024-05-01 DIAGNOSIS — C9110 Chronic lymphocytic leukemia of B-cell type not having achieved remission: Secondary | ICD-10-CM | POA: Diagnosis not present

## 2024-05-01 DIAGNOSIS — J449 Chronic obstructive pulmonary disease, unspecified: Secondary | ICD-10-CM | POA: Diagnosis not present

## 2024-05-03 DIAGNOSIS — N3000 Acute cystitis without hematuria: Secondary | ICD-10-CM | POA: Diagnosis not present

## 2024-05-03 DIAGNOSIS — R410 Disorientation, unspecified: Secondary | ICD-10-CM | POA: Diagnosis not present

## 2024-05-03 DIAGNOSIS — N1832 Chronic kidney disease, stage 3b: Secondary | ICD-10-CM | POA: Diagnosis not present

## 2024-05-03 DIAGNOSIS — D509 Iron deficiency anemia, unspecified: Secondary | ICD-10-CM | POA: Diagnosis not present

## 2024-05-03 DIAGNOSIS — R42 Dizziness and giddiness: Secondary | ICD-10-CM | POA: Diagnosis not present

## 2024-05-03 DIAGNOSIS — Z7401 Bed confinement status: Secondary | ICD-10-CM | POA: Diagnosis not present

## 2024-05-03 DIAGNOSIS — K5900 Constipation, unspecified: Secondary | ICD-10-CM | POA: Diagnosis not present

## 2024-05-03 DIAGNOSIS — R109 Unspecified abdominal pain: Secondary | ICD-10-CM | POA: Diagnosis not present

## 2024-05-03 DIAGNOSIS — J9600 Acute respiratory failure, unspecified whether with hypoxia or hypercapnia: Secondary | ICD-10-CM | POA: Diagnosis not present

## 2024-05-03 DIAGNOSIS — Z136 Encounter for screening for cardiovascular disorders: Secondary | ICD-10-CM | POA: Diagnosis not present

## 2024-05-03 DIAGNOSIS — R1084 Generalized abdominal pain: Secondary | ICD-10-CM | POA: Diagnosis not present

## 2024-05-03 DIAGNOSIS — K7689 Other specified diseases of liver: Secondary | ICD-10-CM | POA: Diagnosis not present

## 2024-05-03 DIAGNOSIS — R110 Nausea: Secondary | ICD-10-CM | POA: Diagnosis not present

## 2024-05-03 DIAGNOSIS — R0602 Shortness of breath: Secondary | ICD-10-CM | POA: Diagnosis not present

## 2024-05-03 DIAGNOSIS — J90 Pleural effusion, not elsewhere classified: Secondary | ICD-10-CM | POA: Diagnosis not present

## 2024-05-06 DIAGNOSIS — R1011 Right upper quadrant pain: Secondary | ICD-10-CM | POA: Diagnosis not present

## 2024-05-07 DIAGNOSIS — R509 Fever, unspecified: Secondary | ICD-10-CM | POA: Diagnosis not present

## 2024-05-07 DIAGNOSIS — C9110 Chronic lymphocytic leukemia of B-cell type not having achieved remission: Secondary | ICD-10-CM | POA: Diagnosis not present

## 2024-05-07 DIAGNOSIS — R1011 Right upper quadrant pain: Secondary | ICD-10-CM | POA: Diagnosis not present

## 2024-05-08 ENCOUNTER — Other Ambulatory Visit: Payer: Self-pay | Admitting: Oncology

## 2024-05-08 ENCOUNTER — Telehealth: Payer: Self-pay | Admitting: Oncology

## 2024-05-08 ENCOUNTER — Inpatient Hospital Stay: Attending: Oncology | Admitting: Oncology

## 2024-05-08 ENCOUNTER — Inpatient Hospital Stay

## 2024-05-08 DIAGNOSIS — D819 Combined immunodeficiency, unspecified: Secondary | ICD-10-CM

## 2024-05-08 DIAGNOSIS — C9110 Chronic lymphocytic leukemia of B-cell type not having achieved remission: Secondary | ICD-10-CM

## 2024-05-08 HISTORY — DX: Chronic lymphocytic leukemia of B-cell type not having achieved remission: C91.10

## 2024-05-08 HISTORY — DX: Combined immunodeficiency, unspecified: D81.9

## 2024-05-08 NOTE — Telephone Encounter (Signed)
 Contacted pt to reschedule missed appts. Unable to reach via phone, voicemail was left.

## 2024-05-10 DIAGNOSIS — C9110 Chronic lymphocytic leukemia of B-cell type not having achieved remission: Secondary | ICD-10-CM | POA: Diagnosis not present

## 2024-05-10 DIAGNOSIS — Z789 Other specified health status: Secondary | ICD-10-CM | POA: Diagnosis not present

## 2024-05-10 DIAGNOSIS — M6259 Muscle wasting and atrophy, not elsewhere classified, multiple sites: Secondary | ICD-10-CM | POA: Diagnosis not present

## 2024-05-13 DIAGNOSIS — R079 Chest pain, unspecified: Secondary | ICD-10-CM | POA: Diagnosis not present

## 2024-05-14 DIAGNOSIS — R5381 Other malaise: Secondary | ICD-10-CM | POA: Diagnosis not present

## 2024-05-14 DIAGNOSIS — I480 Paroxysmal atrial fibrillation: Secondary | ICD-10-CM | POA: Diagnosis not present

## 2024-05-14 DIAGNOSIS — M6259 Muscle wasting and atrophy, not elsewhere classified, multiple sites: Secondary | ICD-10-CM | POA: Diagnosis not present

## 2024-05-14 DIAGNOSIS — C9110 Chronic lymphocytic leukemia of B-cell type not having achieved remission: Secondary | ICD-10-CM | POA: Diagnosis not present

## 2024-05-20 NOTE — Telephone Encounter (Signed)
 Patient has been scheduled. Aware of appt date and time.

## 2024-05-30 NOTE — Progress Notes (Signed)
 Andre Ross  [address] [place],  [place]  [address] [phone]  Ross Day:  05/31/24  Referring physician: Keren Vicenta BRAVO, MD   ASSESSMENT & PLAN:  Assessment: CLL (chronic lymphocytic leukemia) This was just diagnosed in July and associated with anemia, thrombocytopenia, malignant pleural effusion, and generalized adenopathy. He was therefore treated with venetoclax and has been on that for over one month now. He had a severe allergic reaction to rituximab. I agree with this choice of treatment and will follow him for supportive care as his local hematologist.  Iron deficiency anemia He remains on oral iron supplement, but also did receive IV Ferrlecit for 4 days in July 2025. GI evaluation did reveal 2 tubular adenomas of the colon and mild reactive gastritis.   Thrombocytopenia This is relatively mild, but has worsened a little so we will monitor it regularly.   Combined immunodeficiency This is due to his CLL and his IgG level was decreased to 392, IgA decreased to 48, and normal IgM of 37. I would only recommend immunoglobulin infusions if he had serious frequent infections.   Atrial fibrillation He is back on Plavix  after it was on hold during his GI bleed so we will need to monitor his counts regularly.   Bilateral pleural effusions These were tapped several times while in the hospital and one note says they were negative for cancer, but I did find a report that revealed the malignant lymphocytes in the pleural fluid.  Hopefully the venetoclax will treat his CLL which will help the pleural effusion.  Congestive heart failure His EF was good and his dose of Bumex was decreased to 0.5 mg, but I agree he needs 1 mg daily with the degree of edema he currently has and the persistence of his bilateral pleural effusions.   Chronic kidney disease This was so severe that he was temporarily on dialysis during his hospitalization. His parameters have improved  quite a bit so I think he can tolerate the increased diuresis.    Plan: Andre Ross is seen in the Ross for follow up of his CLL. This was discovered during his hospitalization at [facility] from 03/09/2024-04/26/2024 for a lower GI bleed. He was found to have an elevated WBC of 13.7 with an ANC of 5.21, a low hemoglobin of 10.4, and mild thrombocytopenia with a platelet count of 105,000. He was given IV iron and continues oral iron supplement without significant difficulty. A colonoscopy performed on 03/18/2024 revealed one large cecal polyp and one small sigmoid colon polyp. Pathology revealed these were tubular adenomas. He was started on Rituxan which resulted in an allergic reaction involving severe chills. He was switched to 200 mg venetoclax daily which he has been taking without difficulty since early September. He is experiencing hair loss and easy bruising, but denies epistaxis. He received dialysis in the hospital for his CKD. He states that his nephrologist says his kidney function has improved and he has since discontinued dialysis. He continues 0.5 mg Bumex once daily and I recommend that he increase this dose to 1 mg every morning. He has atrial fibrillation and is currently taking 75 mg Plavix  daily for this. Today in office, the patient states that he feels okay and complains of lower abdominal pain rated 4/10. He has a WBC of 4.3 with 45% neutrophils, 28% lymphocytes, and 26% monocytes, a low hemoglobin of 10.7, and a low platelet count of 80,000. His CMP is normal other than an elevated creatinine of  1.26 which is considerably improved. I agree with continuing the venetoclax and we can plan to repeat scans in a few months to check on his adenopathy. We will need to be sure that he is able to obtain a new supply of this. He will be seeing the hematologist at the [facility] later this month, but it would be quite a hardship for him to go there regularly with all of his co-morbidities and oxygen   dependence. I will see him back in 1 month with CBC and CMP. I discussed the assessment and treatment plan with the patient.  The patient was provided an opportunity to ask questions and all were answered.  The patient agreed with the plan and demonstrated an understanding of the instructions.  The patient was advised to call back if the symptoms worsen or if the condition fails to improve as anticipated.  Thank you for the opportunity to care for your patients.  I provided 34 minutes of face-to-face time during this this encounter and > 50% was spent counseling as documented under my assessment and plan.    Andre VEAR Cornish, MD [facility] OF [facility] [address] [place] [address] Dept: [phone] Dept Fax: [phone]   I, Aretta Cook, am acting as scribe for Andre HILARIO Cornish, MD  I have reviewed this report as typed by the medical scribe, and it is complete and accurate.  CHIEF COMPLAINT:  CC: CLL (chronic lymphocytic leukemia)  Current Treatment:  200 mg Venetoclax daily   HISTORY OF PRESENT ILLNESS:  Andre Ross is a 78 y.o. male with a CLL just diagnosed in July of 2025 who is referred in consultation with Dr. Johnie Door, MD for assessment and management.   On 03/09/2024 he was admitted to [facility] for a lower GI bleed and was found to have an elevated WBC of 13.7 with an ANC of 5.21, a low hemoglobin of 10.4, and mild thrombocytopenia with a platelet count of 105,000. Flow cytometry revealed monoclonal B-cells which were positive for CD5 and CD23 and was lambda light chain restricted. On 03/19/2024 he had a left supraclavicular node biopsy which revealed involvement by CD5-positive B cell lymphoma with increased Ki-67 proliferation index of 30-40%. 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 There was a uniform population of small to intermediate sized lymphocytes which were positive  for CD20, CD5, CD23, PAX8, BCL2, and CD21. Cyclin-D1 was negative. He had multiple thoracenteses for bilateral pleural effusion. They obtained 1180cc from the left side which was negative for cancer. However, I did find a report in his records of his monoclonal B-cell lymphocytes in the pleural fluid. Serum immunoglobulins were decreased with an IgG of 392, IgA of 48, and normal IgM of 37. He was given 1 dose of IV Rituxan and had a major infusion reaction and it was stopped. They considered a Bruton kinase inhibitor, but eventually placed the patient on venetoclax with a current dose of 200 mg daily with good response. CT scans did reveal extensive lower cervical, hilar, mediastinal, and upper abdominal adenopathy as well as retroperitoneal adenopathy. He was severely anemic with a hemoglobin at 8.2 and found to be iron deficient with an iron saturation of 7% and iron level of 31. He was given IV iron in the form of Ferrlecit for 4 days in the hospital. He remains on oral iron supplement. When he left the hospital, his WBC was 8.1, hemoglobin 8.2,  and platelet count 108,000. His creatinine was 1.6 with a BUN of 24 at that time. He was started on the venetoclax in early September 2025. On 05/07/2024, his WBC was down to 2.5, hemoglobin down to 8.2, and platelet count down to 89,000.  He has a history of COPD, lower GI bleed, CKD (he required dialysis temporarily while in the hospital), CHF, respiratory failure, atrial fibrillation, coronary artery disease, peripheral neuropathy, gout, hypothyroidism, BPH, and bilateral pleural effusions. However, his echocardiogram revealed an ejection fraction of 60-65%. During his hospitalization, he did have colonoscopy on 03/18/2024 with findings of tubular adenomas in the cecum and sigmoid colon. EGD on that same date revealed mild reactive gastritis. He was discharged on Lasix , but later changed to Bumex 0.5 mg daily. His cardiologist recently increased the dose to 1 mg  daily, and I agree since he has significant edema at this time and persistent pleural effusions.   I have reviewed his chart and materials related to his cancer extensively and collaborated history with the patient. Summary of oncologic history is as follows: Oncology History   No history exists.   INTERVAL HISTORY:   Andre Ross is seen in the Ross for follow up of his CLL. This was discovered during his hospitalization at [facility] from 03/09/2024-04/26/2024 for a lower GI bleed. He was found to have an elevated WBC of 13.7 with an ANC of 5.21, a low hemoglobin of 10.4, and mild thrombocytopenia with a platelet count of 105,000. He was given IV iron and continues oral iron supplement without significant difficulty. A colonoscopy performed on 03/18/2024 revealed one large cecal polyp and one small sigmoid colon polyp. Pathology revealed these were tubular adenomas. He was started on Rituxan which resulted in an allergic reaction involving severe chills. He was switched to 200 mg venetoclax daily which he has been taking without difficulty since early September. He is experiencing hair loss and easy bruising, but denies epistaxis. He received dialysis in the hospital for his CKD. He states that his nephrologist says his kidney function has improved and he has since discontinued dialysis. He continues 0.5 mg Bumex once daily and I recommend that he increase this dose to 1 mg every morning. He has atrial fibrillation and is currently taking 75 mg Plavix  daily for this. Today in office, the patient states that he feels okay and complains of lower abdominal pain rated 4/10. He has a WBC of 4.3 with 45% neutrophils, 28% lymphocytes, and 26% monocytes, a low hemoglobin of 10.7, and a low platelet count of 80,000. His CMP is normal other than an elevated creatinine of 1.26 which is considerably improved. I agree with continuing the venetoclax and we can plan to repeat scans in a few months to check on his  adenopathy. We will need to be sure that he is able to obtain a new supply of this. He will be seeing the hematologist at the [facility] later this month, but it would be quite a hardship for him to go there regularly with all of his co-morbidities and oxygen  dependence. I will see him back in 1 month with CBC and CMP.  He denies fever, chills, night sweats, or other signs of infection. He denies cardiorespiratory and gastrointestinal issues. He  denies pain. His appetite is good. His weight today is 181 lbs 1.6 oz.  HISTORY:   Past Medical History:  Diagnosis Date   (HFpEF) heart failure with preserved ejection fraction (HCC) 03/10/2024   Actinic keratosis 06/06/2024   Acute  gastrointestinal bleeding 03/28/2018   Acute non-ST elevation myocardial infarction (NSTEMI) (HCC) 06/06/2024   Acute on chronic combined systolic and diastolic CHF (congestive heart failure) (HCC) 03/28/2018   Acute on chronic respiratory failure with hypoxemia (HCC) 03/10/2024   Acute respiratory failure (HCC) 07/21/2021   Adjustment disorder with mixed anxiety and depressed mood 06/06/2024   Age-related nuclear cataract, bilateral 06/06/2024   AKI (acute kidney injury) 04/20/2024   Alcohol  abuse 03/28/2018   Allergic rhinitis 06/06/2024   Anemia 06/06/2024   Aspiration pneumonia (HCC) 04/20/2024   B-cell lymphoproliferative disorder (HCC) 03/23/2024   Benign prostatic hyperplasia 06/06/2024   Chronic suppurative otitis media 06/06/2024   CKD (chronic kidney disease) stage 3, GFR 30-59 ml/min (HCC) 03/28/2018   CLL (chronic lymphocytic leukemia) (HCC) 05/08/2024   Cobalamin deficiency 08/22/2005   Congestive heart failure (HCC) 06/06/2024   COPD with acute exacerbation (HCC) 07/21/2021   Coronary artery disease involving native coronary artery 12/07/2023   Degeneration of intervertebral disc of lumbar region 06/06/2024   Diverticular hemorrhage 03/28/2018   Diverticulosis 03/10/2024   Dyspnea, unspecified 06/06/2024    Dystrophia unguium 06/06/2024   Elevated troponin 07/21/2021   Encounter for other preprocedural examination 06/06/2024   Encounter for surgical aftercare following surgery on the sense organs 06/06/2024   Enlarged prostate 06/06/2024   Essential (primary) hypertension 06/06/2024   Gastroesophageal reflux disease 06/06/2024   Gout 06/06/2024   Hallux valgus with bunions 06/06/2024   Heart failure, unspecified (HCC) 06/06/2024   History of malignant neoplasm of prostate 06/06/2024   Hyperkalemia 07/21/2021   Hyperlipidemia 03/28/2018   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 and stage 3 chronic kidney disease (HCC) 03/28/2018   Hypertensive urgency 07/21/2021   Hypothyroidism 06/06/2024   Iron deficiency anemia 06/06/2024   Left ventricular systolic dysfunction 04/24/2018   Segmental LAD distribution EF 45-50%   Localized edema 06/06/2024   Low back pain 06/06/2024   Major depression 06/06/2024   Malaise 06/06/2024   Male erectile disorder 06/06/2024   Mood disorder 06/06/2024   Nightmares associated with chronic post-traumatic stress disorder 06/06/2024   NSTEMI (non-ST elevated myocardial infarction) (HCC) 05/15/2022   Onychomycosis due to dermatophyte 03/21/2019   OSA (obstructive sleep apnea) 12/07/2023   Osteoarthritis of hip 06/06/2024   Jan 22, 2023 Entered By: VINIE ALLEAN AQUAS Comment: left     Other hammer toe(s) (acquired), unspecified foot 06/06/2024   Other idiopathic peripheral autonomic neuropathy 06/06/2024   Other long term (current) drug therapy 06/06/2024   Other seborrheic dermatitis 06/06/2024   Oxygen  dependent 06/06/2024   Pain and swelling of right ankle 12/07/2023   Pain in left foot 06/06/2024   Pain in right foot 06/06/2024   Jul 16, 2023 Entered By: VINIE ALLEAN AQUAS Comment: X-ray-heel spur, hallux valgus, evidence of previous healed fractures     Paroxysmal atrial fibrillation (HCC) 04/20/2024    Pes planus 06/06/2024   Pleural effusion 04/20/2024   Prediabetes 06/06/2024   Presence of intraocular lens 06/06/2024   Primary basal cell carcinoma (BCC) of eyelid of left eye 06/06/2024   Prostate cancer (HCC)    PTSD (post-traumatic stress disorder) 03/28/2018   Sensorineural hearing loss (SNHL) 06/06/2024   Sepsis due to pneumonia (HCC) 07/21/2021   Severe combined immunodeficiency disease (HCC) 05/08/2024   Talipes valgus 06/06/2024   TIA (transient ischemic attack) 03/28/2018   Valgus deformity of great toe 06/06/2024    Past Surgical History:  Procedure Laterality Date   APPENDECTOMY     CORONARY  STENT INTERVENTION N/A 05/16/2022   Procedure: CORONARY STENT INTERVENTION;  Surgeon: Wonda Sharper, MD;  Location: [facility] INVASIVE CV LAB;  Service: Cardiovascular;  Laterality: N/A;   INGUINAL HERNIA REPAIR     INSERTION PROSTATE RADIATION SEED     LEFT HEART CATH AND CORONARY ANGIOGRAPHY N/A 05/16/2022   Procedure: LEFT HEART CATH AND CORONARY ANGIOGRAPHY;  Surgeon: Wonda Sharper, MD;  Location: [facility] INVASIVE CV LAB;  Service: Cardiovascular;  Laterality: N/A;   STOMACH SURGERY     TONSILLECTOMY      Family History  Problem Relation Age of Onset   COPD Father    Heart disease Father    Heart attack Father    Hypertension Maternal Grandmother    Hypertension Paternal Grandmother     Social History:  reports that he quit smoking about 37 years ago. His smoking use included cigarettes. He started smoking about 67 years ago. He has a 30 pack-year smoking history. He has never used smokeless tobacco. He reports current alcohol  use of about 4.0 - 6.0 standard drinks of alcohol  per week. He reports that he does not currently use drugs.The patient is alone today, but was brought to the office by his son  Allergies:  Allergies  Allergen Reactions   Aspirin  Other (See Comments) and Anaphylaxis    GI upset   Hydrocodone Itching and Hives    Current Medications: Current Outpatient  Medications  Medication Sig Dispense Refill   acetaminophen  (TYLENOL ) 500 MG tablet Take 1 tablet by mouth 4 (four) times daily as needed for moderate pain or headache.     albuterol  (PROVENTIL ) (2.5 MG/3ML) 0.083% nebulizer solution Take 2.5 mg by nebulization every 4 (four) hours as needed for wheezing or shortness of breath.     albuterol  (VENTOLIN  HFA) 108 (90 Base) MCG/ACT inhaler Inhale 2 puffs into the lungs every 4 (four) hours as needed for wheezing or shortness of breath.     allopurinol  (ZYLOPRIM ) 300 MG tablet Take 300 mg by mouth daily.     ammonium lactate (LAC-HYDRIN) 12 % lotion Apply 1 Application topically as needed for dry skin.     bumetanide (BUMEX) 0.5 MG tablet Take 0.5 mg by mouth daily.     Cholecalciferol  (VITAMIN D ) 2000 units tablet Take 2,000 Units by mouth daily.     clopidogrel  (PLAVIX ) 75 MG tablet Take 75 mg by mouth daily.     Docusate Sodium (DSS) 100 MG CAPS Take 100 mg by mouth daily as needed (constipation).     ferrous sulfate  325 (65 FE) MG tablet Take 325 mg by mouth 2 (two) times daily after a meal.     fluticasone (FLONASE) 50 MCG/ACT nasal spray Place 1 spray into both nostrils 2 (two) times daily.     folic acid  (FOLVITE ) 1 MG tablet Take 1 mg by mouth daily.     gabapentin  (NEURONTIN ) 300 MG capsule Take 300 mg by mouth at bedtime.     hydrocortisone cream 1 % Apply 1 Application topically 2 (two) times daily.     ketoconazole (NIZORAL) 2 % shampoo Apply 1 Application topically 2 (two) times a week.     levothyroxine  (SYNTHROID ) 50 MCG tablet Take 50 mcg by mouth daily before breakfast.     loratadine  (CLARITIN ) 10 MG tablet Take 10 mg by mouth daily as needed for allergies.     losartan (COZAAR) 25 MG tablet Take 12.5 mg by mouth daily.     Multiple Vitamin (MULTIVITAMIN WITH MINERALS) TABS tablet Take 1  tablet by mouth daily.     pantoprazole  (PROTONIX ) 40 MG tablet Take 40 mg by mouth daily.     rosuvastatin (CRESTOR) 10 MG tablet Take 5 mg by  mouth.     sertraline (ZOLOFT) 50 MG tablet Take 25 mg by mouth.     tamsulosin  (FLOMAX ) 0.4 MG CAPS capsule Take 0.4 mg by mouth.     Tiotropium Bromide  Monohydrate (SPIRIVA  RESPIMAT) 1.25 MCG/ACT AERS Inhale 2 Pump into the lungs daily.     triamcinolone  (KENALOG ) 0.025 % cream Apply 1 Application topically 2 (two) times daily.     venetoclax (VENCLEXTA) 100 MG tablet Take 200 mg by mouth.     vitamin B-12 (CYANOCOBALAMIN) 500 MCG tablet Take 1,000 mcg by mouth daily.     amLODipine  (NORVASC ) 2.5 MG tablet Take 1 tablet (2.5 mg total) by mouth daily. 180 tablet 3   ketotifen (ZADITOR) 0.025 % ophthalmic solution Place 1 drop into both eyes 2 (two) times daily.     nitroGLYCERIN  (NITROSTAT ) 0.4 MG SL tablet Place 1 tablet (0.4 mg total) under the tongue every 5 (five) minutes x 3 doses as needed for chest pain. 25 tablet 3   No current facility-administered medications for this visit.    REVIEW OF SYSTEMS:  Review of Systems  Constitutional:  Positive for fatigue.  HENT:   Positive for hearing loss (hard of hearing). Negative for lump/mass, mouth sores and nosebleeds.   Eyes: Negative.   Respiratory:  Positive for shortness of breath (on oxygen ).   Cardiovascular:  Positive for leg swelling (history of CHF).  Gastrointestinal:  Positive for abdominal pain (lower abdominal pain 4/10).  Endocrine: Negative.   Genitourinary: Negative.    Musculoskeletal:  Negative for gait problem.  Skin: Negative.   Neurological:  Negative for gait problem.  Hematological:  Bruises/bleeds easily.  Psychiatric/Behavioral: Negative.        VITALS:  Blood pressure (!) 173/88, pulse 92, temperature 97.9 F (36.6 C), temperature source Oral, resp. rate 18, height 5' 9 (1.753 m), weight 181 lb 1.6 oz (82.1 kg), SpO2 100%.  Wt Readings from Last 3 Encounters:  06/07/24 189 lb (85.7 kg)  05/31/24 181 lb 1.6 oz (82.1 kg)  06/23/22 180 lb 3.2 oz (81.7 kg)    Body mass index is 26.74  kg/m.  Performance status (ECOG): 1 - Symptomatic but completely ambulatory  PHYSICAL EXAM:  Physical Exam Vitals and nursing note reviewed.  HENT:     Head: Normocephalic and atraumatic.  Cardiovascular:     Rate and Rhythm: Normal rate.     Heart sounds: Normal heart sounds.  Pulmonary:     Breath sounds: Examination of the right-lower field reveals decreased breath sounds. Examination of the left-lower field reveals decreased breath sounds. Decreased breath sounds present.  Musculoskeletal:     Right lower leg: Edema (1-2+) present.     Left lower leg: Edema (1-2+) present.  Lymphadenopathy:     Cervical: No cervical adenopathy.     Upper Body:     Right upper body: Axillary adenopathy present. No supraclavicular, pectoral or epitrochlear adenopathy.     Left upper body: No supraclavicular, axillary, pectoral or epitrochlear adenopathy.     Comments: Small 1-2 cm node in the right axilla which is rubbery.  Neurological:     Mental Status: He is alert.     LABS:      Latest Ref Rng & Units 05/31/2024    3:09 PM 06/02/2022    3:45 PM 05/17/2022  2:16 AM  CBC  WBC 4.0 - 10.5 K/uL 4.3  10.5  11.3   Hemoglobin 13.0 - 17.0 g/dL 89.2  87.0  89.6   Hematocrit 39.0 - 52.0 % 33.0  39.6  31.4   Platelets 150 - 400 K/uL 80  243  179       Latest Ref Rng & Units 05/31/2024    3:09 PM 06/02/2022    3:45 PM 05/17/2022    2:16 AM  CMP  Glucose 70 - 99 mg/dL 91  890  896   BUN 8 - 23 mg/dL 21  27  33   Creatinine 0.61 - 1.24 mg/dL 8.73  8.62  8.64   Sodium 135 - 145 mmol/L 142  144  141   Potassium 3.5 - 5.1 mmol/L 4.0  4.0  3.9   Chloride 98 - 111 mmol/L 102  104  111   CO2 22 - 32 mmol/L 24  24  23    Calcium  8.9 - 10.3 mg/dL 8.9  9.7  8.4   Total Protein 6.5 - 8.1 g/dL 6.8     Total Bilirubin 0.0 - 1.2 mg/dL 0.8     Alkaline Phos 38 - 126 U/L 100     AST 15 - 41 U/L 40     ALT 0 - 44 U/L 21        No results found for: CEA1, CEA / No results found for: CEA1,  CEA No results found for: PSA1 No results found for: CAN199 No results found for: CAN125  No results found for: TOTALPROTELP, ALBUMINELP, A1GS, A2GS, BETS, BETA2SER, GAMS, MSPIKE, SPEI Lab Results  Component Value Date   TIBC 371 05/31/2024   FERRITIN 345 (H) 05/31/2024   IRONPCTSAT 24 05/31/2024   Lab Results  Component Value Date   LDH 232 (H) 05/31/2024    STUDIES:  EXAM: 04/23/2024 XR CHEST  IMPRESSION: -Cardiomegaly with mild pulmonary venous congestion and small bilateral pleural effusions, similar to prior study.    EXAM: 04/16/2024 XR ABDOMEN  IMPRESSION: 1.  Nonobstructed bowel gas pattern.  2.  Small bilateral pleural effusions with bibasilar atelectasis or infection.   EXAM: 03/25/2024 US  RENAL  IMPRESSION: Increased renal cortical echogenicity suggesting medical renal disease. There is no hydronephrosis. There is an apparent left pleural effusion.   EXAM: 03/18/2024 CT ABDOMEN PELVIS W IV CONTRAST IMPRESSION: 1. Hepatosplenomegaly. Indeterminate upper abdominal, mediastinal, hilar, and retroperitoneal adenopathy. Findings may relate to a lymphoproliferative disorder/lymphoma. 2. Cholelithiasis with stones in the gallbladder neck. 3. Colonic diverticulosis. 4. Possible wall thickening of the rectum, as can be seen with proctitis. 5. Large hiatal hernia. 6. Atelectasis/consolidation in the lung bases. 7. Left greater than right pleural effusions.   EXAM: 03/12/2024 CT CHEST WO CONTRAST IMPRESSION: 1. Persistent moderate to large bilateral pleural effusions. 2. Mediastinal and upper abdominal adenopathy with splenomegaly. Differential considerations include lymphoma and metastatic disease. 3. Emphysema. 4. Cholelithiasis.  EXAM: 03/12/2024 CT ANGIO CHEST PULMONARY IMPRESSION: 1. No evidence of pulmonary embolus. 2. Significant lower cervial, mediastinal and upper abdominal adenopathy unchanged. 3. Significant bilateral  pleural effusions LEFT greater than RIGHT with compressive atelectasis. 4. Emphysema  EXAM: 03/12/2024 ECHOCARDIOGRAM THORACENTESIS CONCLUSIONS: Left ventricular size is normal Left ventricular ejection fraction is 50-55% The left atrium is slightly dilated There is trivial mitral regurgitation There is mild aortic sclerosis without evidence of stenosis There is trace aortic regurgitation Pulmonary artery systolic pressure is mild-to-moderately elevated There is pleural effusion observed   I,Timiya Howells H Sahith Nurse,acting as a scribe  for Andre VEAR Cornish, MD.,have documented all relevant documentation on the behalf of Andre VEAR Cornish, MD,as directed by  Andre VEAR Cornish, MD while in the presence of Andre VEAR Cornish, MD.   I have reviewed this report as typed by the medical scribe, and it is complete and accurate

## 2024-05-31 ENCOUNTER — Other Ambulatory Visit: Payer: Self-pay | Admitting: Oncology

## 2024-05-31 ENCOUNTER — Encounter: Payer: Self-pay | Admitting: Oncology

## 2024-05-31 ENCOUNTER — Inpatient Hospital Stay: Attending: Oncology

## 2024-05-31 ENCOUNTER — Telehealth: Payer: Self-pay | Admitting: Oncology

## 2024-05-31 ENCOUNTER — Inpatient Hospital Stay: Admitting: Oncology

## 2024-05-31 VITALS — BP 173/88 | HR 92 | Temp 97.9°F | Resp 18 | Ht 69.0 in | Wt 181.1 lb

## 2024-05-31 DIAGNOSIS — D509 Iron deficiency anemia, unspecified: Secondary | ICD-10-CM | POA: Diagnosis not present

## 2024-05-31 DIAGNOSIS — J90 Pleural effusion, not elsewhere classified: Secondary | ICD-10-CM | POA: Diagnosis not present

## 2024-05-31 DIAGNOSIS — I509 Heart failure, unspecified: Secondary | ICD-10-CM

## 2024-05-31 DIAGNOSIS — D819 Combined immunodeficiency, unspecified: Secondary | ICD-10-CM | POA: Diagnosis not present

## 2024-05-31 DIAGNOSIS — C9110 Chronic lymphocytic leukemia of B-cell type not having achieved remission: Secondary | ICD-10-CM

## 2024-05-31 DIAGNOSIS — Z9981 Dependence on supplemental oxygen: Secondary | ICD-10-CM | POA: Insufficient documentation

## 2024-05-31 DIAGNOSIS — Z7969 Long term (current) use of other immunomodulators and immunosuppressants: Secondary | ICD-10-CM

## 2024-05-31 DIAGNOSIS — D696 Thrombocytopenia, unspecified: Secondary | ICD-10-CM | POA: Diagnosis not present

## 2024-05-31 DIAGNOSIS — N189 Chronic kidney disease, unspecified: Secondary | ICD-10-CM

## 2024-05-31 DIAGNOSIS — I4891 Unspecified atrial fibrillation: Secondary | ICD-10-CM

## 2024-05-31 LAB — CBC WITH DIFFERENTIAL (CANCER CENTER ONLY)
Abs Immature Granulocytes: 0.01 K/uL (ref 0.00–0.07)
Basophils Absolute: 0 K/uL (ref 0.0–0.1)
Basophils Relative: 0 %
Eosinophils Absolute: 0 K/uL (ref 0.0–0.5)
Eosinophils Relative: 1 %
HCT: 33 % — ABNORMAL LOW (ref 39.0–52.0)
Hemoglobin: 10.7 g/dL — ABNORMAL LOW (ref 13.0–17.0)
Immature Granulocytes: 0 %
Lymphocytes Relative: 28 %
Lymphs Abs: 1.2 K/uL (ref 0.7–4.0)
MCH: 29.1 pg (ref 26.0–34.0)
MCHC: 32.4 g/dL (ref 30.0–36.0)
MCV: 89.7 fL (ref 80.0–100.0)
Monocytes Absolute: 1.1 K/uL — ABNORMAL HIGH (ref 0.1–1.0)
Monocytes Relative: 26 %
Neutro Abs: 2 K/uL (ref 1.7–7.7)
Neutrophils Relative %: 45 %
Platelet Count: 80 K/uL — ABNORMAL LOW (ref 150–400)
RBC: 3.68 MIL/uL — ABNORMAL LOW (ref 4.22–5.81)
RDW: 19.6 % — ABNORMAL HIGH (ref 11.5–15.5)
WBC Count: 4.3 K/uL (ref 4.0–10.5)
nRBC: 0 % (ref 0.0–0.2)

## 2024-05-31 LAB — CMP (CANCER CENTER ONLY)
ALT: 21 U/L (ref 0–44)
AST: 40 U/L (ref 15–41)
Albumin: 4.5 g/dL (ref 3.5–5.0)
Alkaline Phosphatase: 100 U/L (ref 38–126)
Anion gap: 16 — ABNORMAL HIGH (ref 5–15)
BUN: 21 mg/dL (ref 8–23)
CO2: 24 mmol/L (ref 22–32)
Calcium: 8.9 mg/dL (ref 8.9–10.3)
Chloride: 102 mmol/L (ref 98–111)
Creatinine: 1.26 mg/dL — ABNORMAL HIGH (ref 0.61–1.24)
GFR, Estimated: 58 mL/min — ABNORMAL LOW (ref >60)
Glucose, Bld: 91 mg/dL (ref 70–99)
Potassium: 4 mmol/L (ref 3.5–5.1)
Sodium: 142 mmol/L (ref 135–145)
Total Bilirubin: 0.8 mg/dL (ref 0.0–1.2)
Total Protein: 6.8 g/dL (ref 6.5–8.1)

## 2024-05-31 LAB — LACTATE DEHYDROGENASE: LDH: 232 U/L — ABNORMAL HIGH (ref 98–192)

## 2024-05-31 LAB — IRON AND TIBC
Iron: 87 ug/dL (ref 45–182)
Saturation Ratios: 24 % (ref 17.9–39.5)
TIBC: 371 ug/dL (ref 250–450)
UIBC: 284 ug/dL

## 2024-05-31 LAB — FERRITIN: Ferritin: 345 ng/mL — ABNORMAL HIGH (ref 24–336)

## 2024-05-31 NOTE — Telephone Encounter (Signed)
 Patient has been scheduled for follow-up visit per 05/31/24 LOS.  Pt given an appt calendar with date and time.

## 2024-06-02 LAB — SOLUBLE TRANSFERRIN RECEPTOR: Transferrin Receptor: 21.6 nmol/L (ref 12.2–27.3)

## 2024-06-04 DIAGNOSIS — J90 Pleural effusion, not elsewhere classified: Secondary | ICD-10-CM | POA: Diagnosis not present

## 2024-06-04 DIAGNOSIS — I4891 Unspecified atrial fibrillation: Secondary | ICD-10-CM | POA: Diagnosis not present

## 2024-06-04 DIAGNOSIS — J9811 Atelectasis: Secondary | ICD-10-CM | POA: Diagnosis not present

## 2024-06-04 DIAGNOSIS — I5030 Unspecified diastolic (congestive) heart failure: Secondary | ICD-10-CM | POA: Diagnosis not present

## 2024-06-05 DIAGNOSIS — E039 Hypothyroidism, unspecified: Secondary | ICD-10-CM | POA: Diagnosis not present

## 2024-06-05 DIAGNOSIS — J9611 Chronic respiratory failure with hypoxia: Secondary | ICD-10-CM | POA: Diagnosis not present

## 2024-06-05 DIAGNOSIS — I480 Paroxysmal atrial fibrillation: Secondary | ICD-10-CM | POA: Diagnosis not present

## 2024-06-05 DIAGNOSIS — C9110 Chronic lymphocytic leukemia of B-cell type not having achieved remission: Secondary | ICD-10-CM | POA: Diagnosis not present

## 2024-06-05 DIAGNOSIS — Z23 Encounter for immunization: Secondary | ICD-10-CM | POA: Diagnosis not present

## 2024-06-05 DIAGNOSIS — E785 Hyperlipidemia, unspecified: Secondary | ICD-10-CM | POA: Diagnosis not present

## 2024-06-05 DIAGNOSIS — I5030 Unspecified diastolic (congestive) heart failure: Secondary | ICD-10-CM | POA: Diagnosis not present

## 2024-06-06 DIAGNOSIS — L218 Other seborrheic dermatitis: Secondary | ICD-10-CM | POA: Insufficient documentation

## 2024-06-06 DIAGNOSIS — M79672 Pain in left foot: Secondary | ICD-10-CM | POA: Insufficient documentation

## 2024-06-06 DIAGNOSIS — E039 Hypothyroidism, unspecified: Secondary | ICD-10-CM | POA: Insufficient documentation

## 2024-06-06 DIAGNOSIS — F39 Unspecified mood [affective] disorder: Secondary | ICD-10-CM | POA: Insufficient documentation

## 2024-06-06 DIAGNOSIS — M51369 Other intervertebral disc degeneration, lumbar region without mention of lumbar back pain or lower extremity pain: Secondary | ICD-10-CM | POA: Insufficient documentation

## 2024-06-06 DIAGNOSIS — R7303 Prediabetes: Secondary | ICD-10-CM | POA: Insufficient documentation

## 2024-06-06 DIAGNOSIS — R600 Localized edema: Secondary | ICD-10-CM | POA: Insufficient documentation

## 2024-06-06 DIAGNOSIS — I214 Non-ST elevation (NSTEMI) myocardial infarction: Secondary | ICD-10-CM | POA: Insufficient documentation

## 2024-06-06 DIAGNOSIS — Z79899 Other long term (current) drug therapy: Secondary | ICD-10-CM | POA: Insufficient documentation

## 2024-06-06 DIAGNOSIS — Z9981 Dependence on supplemental oxygen: Secondary | ICD-10-CM | POA: Insufficient documentation

## 2024-06-06 DIAGNOSIS — L603 Nail dystrophy: Secondary | ICD-10-CM | POA: Insufficient documentation

## 2024-06-06 DIAGNOSIS — G9009 Other idiopathic peripheral autonomic neuropathy: Secondary | ICD-10-CM | POA: Insufficient documentation

## 2024-06-06 DIAGNOSIS — N400 Enlarged prostate without lower urinary tract symptoms: Secondary | ICD-10-CM | POA: Insufficient documentation

## 2024-06-06 DIAGNOSIS — H2513 Age-related nuclear cataract, bilateral: Secondary | ICD-10-CM | POA: Insufficient documentation

## 2024-06-06 DIAGNOSIS — C44111 Basal cell carcinoma of skin of unspecified eyelid, including canthus: Secondary | ICD-10-CM | POA: Insufficient documentation

## 2024-06-06 DIAGNOSIS — I509 Heart failure, unspecified: Secondary | ICD-10-CM | POA: Insufficient documentation

## 2024-06-06 DIAGNOSIS — M169 Osteoarthritis of hip, unspecified: Secondary | ICD-10-CM | POA: Insufficient documentation

## 2024-06-06 DIAGNOSIS — R5381 Other malaise: Secondary | ICD-10-CM | POA: Insufficient documentation

## 2024-06-06 DIAGNOSIS — K219 Gastro-esophageal reflux disease without esophagitis: Secondary | ICD-10-CM | POA: Insufficient documentation

## 2024-06-06 DIAGNOSIS — N529 Male erectile dysfunction, unspecified: Secondary | ICD-10-CM | POA: Insufficient documentation

## 2024-06-06 DIAGNOSIS — F515 Nightmare disorder: Secondary | ICD-10-CM | POA: Insufficient documentation

## 2024-06-06 DIAGNOSIS — D649 Anemia, unspecified: Secondary | ICD-10-CM | POA: Insufficient documentation

## 2024-06-06 DIAGNOSIS — Z01818 Encounter for other preprocedural examination: Secondary | ICD-10-CM | POA: Insufficient documentation

## 2024-06-06 DIAGNOSIS — J309 Allergic rhinitis, unspecified: Secondary | ICD-10-CM | POA: Insufficient documentation

## 2024-06-06 DIAGNOSIS — M109 Gout, unspecified: Secondary | ICD-10-CM | POA: Insufficient documentation

## 2024-06-06 DIAGNOSIS — M545 Low back pain, unspecified: Secondary | ICD-10-CM | POA: Insufficient documentation

## 2024-06-06 DIAGNOSIS — F329 Major depressive disorder, single episode, unspecified: Secondary | ICD-10-CM | POA: Insufficient documentation

## 2024-06-06 DIAGNOSIS — D509 Iron deficiency anemia, unspecified: Secondary | ICD-10-CM | POA: Insufficient documentation

## 2024-06-06 DIAGNOSIS — L570 Actinic keratosis: Secondary | ICD-10-CM | POA: Insufficient documentation

## 2024-06-06 DIAGNOSIS — M2140 Flat foot [pes planus] (acquired), unspecified foot: Secondary | ICD-10-CM | POA: Insufficient documentation

## 2024-06-06 DIAGNOSIS — Q666 Other congenital valgus deformities of feet: Secondary | ICD-10-CM | POA: Insufficient documentation

## 2024-06-06 DIAGNOSIS — M79671 Pain in right foot: Secondary | ICD-10-CM | POA: Insufficient documentation

## 2024-06-06 DIAGNOSIS — H663X9 Other chronic suppurative otitis media, unspecified ear: Secondary | ICD-10-CM | POA: Insufficient documentation

## 2024-06-06 DIAGNOSIS — F4323 Adjustment disorder with mixed anxiety and depressed mood: Secondary | ICD-10-CM | POA: Insufficient documentation

## 2024-06-06 DIAGNOSIS — I10 Essential (primary) hypertension: Secondary | ICD-10-CM | POA: Insufficient documentation

## 2024-06-06 DIAGNOSIS — H905 Unspecified sensorineural hearing loss: Secondary | ICD-10-CM | POA: Insufficient documentation

## 2024-06-06 DIAGNOSIS — Z961 Presence of intraocular lens: Secondary | ICD-10-CM | POA: Insufficient documentation

## 2024-06-06 DIAGNOSIS — M2010 Hallux valgus (acquired), unspecified foot: Secondary | ICD-10-CM | POA: Insufficient documentation

## 2024-06-06 DIAGNOSIS — R0600 Dyspnea, unspecified: Secondary | ICD-10-CM | POA: Insufficient documentation

## 2024-06-06 DIAGNOSIS — Z48810 Encounter for surgical aftercare following surgery on the sense organs: Secondary | ICD-10-CM | POA: Insufficient documentation

## 2024-06-06 DIAGNOSIS — M2040 Other hammer toe(s) (acquired), unspecified foot: Secondary | ICD-10-CM | POA: Insufficient documentation

## 2024-06-06 DIAGNOSIS — Z8546 Personal history of malignant neoplasm of prostate: Secondary | ICD-10-CM | POA: Insufficient documentation

## 2024-06-06 DIAGNOSIS — C61 Malignant neoplasm of prostate: Secondary | ICD-10-CM | POA: Insufficient documentation

## 2024-06-06 NOTE — Progress Notes (Signed)
 Cardiology Office Note   Date:  06/07/2024  ID:  Andre Ross 09-27-1945, MRN [id number] PCP: Andre Vicenta BRAVO, MD  [facility] Providers Cardiologist:  Redell Leiter, MD     History of Present Illness Andre Ross is a 78 y.o. male with a past medical history of HFpEF, CAD s/p DES pLAD, hypertension, CKD stage III, PAF, TIA, COPD 2lpm, OSA, history of GI bleed, GERD, hypothyroidism, history of prostate cancer, CLL, enlarged prostate, dyslipidemia.  03/08/2024 echo EF 50 to 55%, impaired relaxation, trivial MR, mild aortic sclerosis without stenosis, mild TR PASP mild to moderately elevated 05/17/2022 left heart cath severe proximal LAD stenosis s/p DES x 1, mild to moderate nonobstructive in the proximal left circumflex 05/11/2022 echo EF 55 to 60%, mild MR, mild aortic valve sclerosis without stenosis, PASP moderately elevated 03/19/2018 echo EF 45 to 50%, moderate MR, mild aortic regurgitation  Andre Ross established care with Dr. Leiter in 2019 at the behest of his PCP following recent hospitalization for heart failure exacerbation that was in the setting of alcohol  abuse.  Echo at that time revealed an EF of 45 to 50%, proBNP of been elevated, he was diuresed and subsequently discharged home.  2023 echo revealed increase in his EF.  Also in 2023 he presented to the hospital with chest pain, he had EKG changes and subsequently underwent left heart cath revealing severe proximal LAD stenosis treated with PCI/DES x 1, mild to moderate nonobstructive disease in his proximal left circumflex recommendations for DAPT for at least 1 year and cessation from alcohol .    Most recently he was evaluated by Andre Anger, PA in November 2023, he was stable from a cardiac perspective, no changes made to medications or plan of care and he was advised to follow-up in 3 months.  Since he was last evaluated with our practice he has been hospitalized for approximately 40+ days at [facility]  health for heart failure, GI bleed and respiratory failure, ultimately diagnosed with CLL, he required several thoracentesis and was discharged to SNF, has been discharged back home from SNF approximately 3 weeks.  He is feeling okay overall, dealing with fatigue, sometimes episodes of chest pain but they are hard for him to decipher does not sound to be consistent with angina and he is not sure if it is related to his COPD or not.  He is on oxygen  2 L around-the-clock now, will establish with a local pulmonologist in the next week or 2.  [facility].  His legs are edematous however he states this is at baseline for him and overall improved. He denies chest pain, palpitations, dyspnea, pnd, orthopnea, n, v, dizziness, syncope, weight gain, or early satiety.    ROS: Review of Systems  Constitutional:  Positive for malaise/fatigue.  Respiratory:  Positive for shortness of breath.   Musculoskeletal:  Positive for myalgias.  Endo/Heme/Allergies:  Bruises/bleeds easily.     Studies Reviewed EKG Interpretation Date/Time:  Friday June 07 2024 11:02:45 EDT Ventricular Rate:  100 PR Interval:  142 QRS Duration:  88 QT Interval:  346 QTC Calculation: 446 R Axis:   57  Text Interpretation: Normal sinus rhythm Normal ECG When compared with ECG of 17-May-2022 05:39, Vent. rate has increased BY  33 BPM Nonspecific T wave abnormality, improved in Inferior leads T wave inversion no longer evident in Anterolateral leads Confirmed by Andre Ross [phone]) on 06/07/2024 11:04:59 AM  Cardiac Studies & Procedures   ______________________________________________________________________________________________ CARDIAC CATHETERIZATION  CARDIAC CATHETERIZATION 05/16/2022  Conclusion 1.  Severe proximal LAD stenosis treated with a 3.5 x 16 mm Synergy DES, postdilated to high-pressure with a 3.75 mm [place] balloon 2.  Mild to moderate nonobstructive  proximal left circumflex stenosis 3.  Widely patent, large dominant RCA with no stenosis 4.  Low LVEDP  Recommendations: Continue dual antiplatelet therapy with aspirin  and clopidogrel  at least 12 months without interruption (ACS class I guideline).  Aggressive risk reduction measures.  As long as no complications arise, patient could be discharged from the hospital tomorrow.  Findings Coronary Findings Diagnostic  Dominance: Right  Left Main The left main is patent with no stenosis.  The vessel divides into the LAD and left circumflex.  Left Anterior Descending Prox LAD lesion is 80% stenosed. The lesion is eccentric. The lesion is calcified. The proximal LAD has an 80% eccentric stenosis  Left Circumflex Ost Cx to Prox Cx lesion is 40% stenosed. The circumflex is a large-caliber vessel that supplies 2 obtuse marginal branches and has mild to moderate proximal stenosis that does not appear to be flow obstructive.  Right Coronary Artery Vessel was injected. Vessel is large. The vessel exhibits minimal luminal irregularities. Large, dominant vessel with no significant obstructive disease.  There are minimal irregularities noted.  Intervention  Prox LAD lesion Stent CATH LAUNCHER 6FR EBU3.5 guide catheter was inserted. Lesion crossed with guidewire using a WIRE COUGAR XT STRL 190CM. Pre-stent angioplasty was performed using a BALLN SAPPHIRE 2.5X12. A drug-eluting stent was successfully placed using a SYNERGY XD 3.50X16. Maximum pressure: 16 atm. Post-stent angioplasty was performed using a BALL SAPPHIRE NC24 3.75X12. Maximum pressure:  20 atm. Post-Intervention Lesion Assessment The intervention was successful. Pre-interventional TIMI flow is 3. Post-intervention TIMI flow is 3. No complications occurred at this lesion. There is a 0% residual stenosis post intervention.   STRESS TESTS  MYOCARDIAL PERFUSION IMAGING 12/14/2017   ECHOCARDIOGRAM  ECHOCARDIOGRAM COMPLETE  07/22/2021  Narrative ECHOCARDIOGRAM REPORT    Patient Name:   Andre Ross Date of Exam: 07/22/2021 Medical Rec #:  [id number]      Height:       69.0 in Accession #:    [id number]     Weight:       179.9 lb Date of Birth:  April 08, 1946      BSA:          1.975 m Patient Age:    75 years       BP:           148/84 mmHg Patient Gender: M              HR:           88 bpm. Exam Location:  Inpatient  Procedure: 2D Echo  Indications:    CHF- Acute Systolic I50.21  History:        Patient has prior history of Echocardiogram examinations, most recent 03/19/2018. Risk Factors:Dyslipidemia and Alcohol  Abuse.  Sonographer:    Augustin Seals RDCS Referring Phys: [id number] RONDELL A SMITH  IMPRESSIONS   1. Left ventricular ejection fraction, by estimation, is 70 to 75%. The left ventricle has hyperdynamic function. The left ventricle has no regional wall motion abnormalities. Left ventricular diastolic parameters are consistent with Grade I diastolic dysfunction (impaired relaxation). 2. Right ventricular systolic function is normal. The right ventricular size is normal. 3. The mitral valve is normal in structure. No evidence of mitral valve regurgitation. No  evidence of mitral stenosis. 4. The aortic valve has an indeterminant number of cusps. Aortic valve regurgitation is not visualized. No aortic stenosis is present. 5. The inferior vena cava is normal in size with greater than 50% respiratory variability, suggesting right atrial pressure of 3 mmHg.  FINDINGS Left Ventricle: Left ventricular ejection fraction, by estimation, is 70 to 75%. The left ventricle has hyperdynamic function. The left ventricle has no regional wall motion abnormalities. The left ventricular internal cavity size was normal in size. There is no left ventricular hypertrophy. Left ventricular diastolic parameters are consistent with Grade I diastolic dysfunction (impaired relaxation).  Right Ventricle: The right  ventricular size is normal. Right ventricular systolic function is normal.  Left Atrium: Left atrial size was normal in size.  Right Atrium: Right atrial size was normal in size.  Pericardium: Trivial pericardial effusion is present.  Mitral Valve: The mitral valve is normal in structure. No evidence of mitral valve regurgitation. No evidence of mitral valve stenosis.  Tricuspid Valve: The tricuspid valve is normal in structure. Tricuspid valve regurgitation is trivial. No evidence of tricuspid stenosis.  Aortic Valve: The aortic valve has an indeterminant number of cusps. Aortic valve regurgitation is not visualized. No aortic stenosis is present.  Pulmonic Valve: The pulmonic valve was not well visualized. Pulmonic valve regurgitation is not visualized. No evidence of pulmonic stenosis.  Aorta: The aortic root is normal in size and structure.  Venous: The inferior vena cava is normal in size with greater than 50% respiratory variability, suggesting right atrial pressure of 3 mmHg.  IAS/Shunts: No atrial level shunt detected by color flow Doppler.   LEFT VENTRICLE PLAX 2D LVIDd:         4.70 cm   Diastology LVIDs:         3.00 cm   LV e' medial:    3.73 cm/s LV PW:         1.00 cm   LV E/e' medial:  19.6 LV IVS:        1.00 cm   LV e' lateral:   4.38 cm/s LVOT diam:     2.00 cm   LV E/e' lateral: 16.7 LV SV:         71 LV SV Index:   36 LVOT Area:     3.14 cm   RIGHT VENTRICLE RV S prime:     18.10 cm/s TAPSE (M-mode): 1.8 cm  LEFT ATRIUM             Index        RIGHT ATRIUM           Index LA diam:        3.10 cm 1.57 cm/m   RA Area:     16.50 cm LA Vol (A2C):   53.5 ml 27.09 ml/m  RA Volume:   38.70 ml  19.59 ml/m LA Vol (A4C):   45.2 ml 22.89 ml/m LA Biplane Vol: 48.8 ml 24.71 ml/m AORTIC VALVE LVOT Vmax:   119.00 cm/s LVOT Vmean:  83.000 cm/s LVOT VTI:    0.226 m  AORTA Ao Root diam: 2.70 cm Ao Asc diam:  2.90 cm  MITRAL VALVE MV Area (PHT): 3.39 cm      SHUNTS MV Decel Time: 224 msec     Systemic VTI:  0.23 m MV E velocity: 73.10 cm/s   Systemic Diam: 2.00 cm MV A velocity: 108.00 cm/s MV E/A ratio:  0.68  Redell Shallow MD Electronically signed by Redell Shallow  MD Signature Date/Time: 07/22/2021/12:17:15 PM    Final          ______________________________________________________________________________________________      Risk Assessment/Calculations    Physical Exam VS:  BP (!) 150/84   Pulse 100   Ht 5' 9 (1.753 m)   Wt 189 lb (85.7 kg)   SpO2 97%   BMI 27.91 kg/m        Wt Readings from Last 3 Encounters:  06/07/24 189 lb (85.7 kg)  05/31/24 181 lb 1.6 oz (82.1 kg)  06/23/22 180 lb 3.2 oz (81.7 kg)    GEN: Well nourished, well developed in no acute distress NECK: No JVD; No carotid bruits CARDIAC: RRR, no murmurs, rubs, gallops RESPIRATORY:  Clear to auscultation without rales, wheezing or rhonchi  ABDOMEN: Soft, non-tender, non-distended EXTREMITIES:  No edema; No deformity   ASSESSMENT AND PLAN CAD-s/p non-STEMI in 2023 with PCI/DES to pLAD x 1, continue Plavix  75 mg daily, nitroglycerin  as needed--has not needed, Crestor 10 mg daily.  He is having some episodes of chest pain that are hard for him to decipher, does not sound to be consistent with angina or with worsening activity, will start him on low-dose amlodipine  to see if this helps alleviate his symptoms.  I do not think he needs an ischemic evaluation at this time as he has a lot going on with his health in general.  HFpEF - NYHA class II, euvolemic.  Continue Cozaar 12.5 mg daily, Bumex 0.5 mg daily.  Hypertension-blood pressure is elevated 150/84, continue Cozaar 12.5 mg daily, will start amlodipine  to help with with his blood pressure and possibly any episodes of chest pain he is having.  Dyslipidemia-continue Crestor 10 mg daily, prefer his LDL to be less than 70.  Is not clear if [facility] or the [facility] is [facility] this however he says we  typically do not check this for him.  COPD-he is on oxygen , has gotten established with [facility] pulmonology in the next few weeks.  CLL - following with cancer center as well as the [place].         Dispo: Start amlodipine  2.5 mg daily, follow-up in 3 months with Dr. Monetta.  Signed, Delon JAYSON Hoover, NP

## 2024-06-07 ENCOUNTER — Encounter: Payer: Self-pay | Admitting: Cardiology

## 2024-06-07 ENCOUNTER — Ambulatory Visit: Attending: Cardiology | Admitting: Cardiology

## 2024-06-07 VITALS — BP 150/84 | HR 100 | Ht 69.0 in | Wt 189.0 lb

## 2024-06-07 DIAGNOSIS — E782 Mixed hyperlipidemia: Secondary | ICD-10-CM | POA: Diagnosis not present

## 2024-06-07 DIAGNOSIS — I25118 Atherosclerotic heart disease of native coronary artery with other forms of angina pectoris: Secondary | ICD-10-CM

## 2024-06-07 DIAGNOSIS — J449 Chronic obstructive pulmonary disease, unspecified: Secondary | ICD-10-CM

## 2024-06-07 DIAGNOSIS — I5032 Chronic diastolic (congestive) heart failure: Secondary | ICD-10-CM

## 2024-06-07 DIAGNOSIS — I10 Essential (primary) hypertension: Secondary | ICD-10-CM

## 2024-06-07 MED ORDER — AMLODIPINE BESYLATE 2.5 MG PO TABS: 2.5000 mg | ORAL_TABLET | Freq: Every day | ORAL | 3 refills | Status: AC

## 2024-06-07 NOTE — Patient Instructions (Signed)
 Medication Instructions:  Your physician has recommended you make the following change in your medication:  Start Amlodipine  2.5 mg once daily  *If you need a refill on your cardiac medications before your next appointment, please call your pharmacy*  Lab Work: NONE If you have labs (blood work) drawn today and your tests are completely normal, you will receive your results only by: MyChart Message (if you have MyChart) OR A paper copy in the mail If you have any lab test that is abnormal or we need to change your treatment, we will call you to review the results.  Testing/Procedures: NONE  Follow-Up: At [facility], you and your health needs are our priority.  As part of our continuing mission to provide you with exceptional heart care, our providers are all part of one team.  This team includes your primary Cardiologist (physician) and Advanced Practice Providers or APPs (Physician Assistants and Nurse Practitioners) who all work together to provide you with the care you need, when you need it.  Your next appointment:   3 month(s)  Provider:   Redell Leiter, MD    We recommend signing up for the patient portal called MyChart.  Sign up information is provided on this After Visit Summary.  MyChart is used to connect with patients for Virtual Visits (Telemedicine).  Patients are able to view lab/test results, encounter notes, upcoming appointments, etc.  Non-urgent messages can be sent to your provider as well.   To learn more about what you can do with MyChart, go to [email or web address].   Other Instructions

## 2024-06-19 DIAGNOSIS — J4550 Severe persistent asthma, uncomplicated: Secondary | ICD-10-CM | POA: Diagnosis not present

## 2024-06-19 DIAGNOSIS — G4733 Obstructive sleep apnea (adult) (pediatric): Secondary | ICD-10-CM | POA: Diagnosis not present

## 2024-06-19 DIAGNOSIS — R918 Other nonspecific abnormal finding of lung field: Secondary | ICD-10-CM | POA: Diagnosis not present

## 2024-06-19 DIAGNOSIS — J9611 Chronic respiratory failure with hypoxia: Secondary | ICD-10-CM | POA: Diagnosis not present

## 2024-06-21 ENCOUNTER — Other Ambulatory Visit (HOSPITAL_COMMUNITY)
Admission: RE | Admit: 2024-06-21 | Discharge: 2024-06-21 | Disposition: A | Discharge status: Home / Self Care | Source: Ambulatory Visit | Attending: Emergency Medicine | Admitting: Emergency Medicine

## 2024-06-21 ENCOUNTER — Telehealth: Payer: Self-pay | Admitting: Cardiology

## 2024-06-21 DIAGNOSIS — I130 Hypertensive heart and chronic kidney disease with heart failure and stage 1 through stage 4 chronic kidney disease, or unspecified chronic kidney disease: Secondary | ICD-10-CM | POA: Diagnosis not present

## 2024-06-21 DIAGNOSIS — R0602 Shortness of breath: Secondary | ICD-10-CM | POA: Diagnosis not present

## 2024-06-21 DIAGNOSIS — J9600 Acute respiratory failure, unspecified whether with hypoxia or hypercapnia: Secondary | ICD-10-CM | POA: Diagnosis not present

## 2024-06-21 DIAGNOSIS — R609 Edema, unspecified: Secondary | ICD-10-CM | POA: Diagnosis not present

## 2024-06-21 DIAGNOSIS — I1310 Hypertensive heart and chronic kidney disease without heart failure, with stage 1 through stage 4 chronic kidney disease, or unspecified chronic kidney disease: Secondary | ICD-10-CM | POA: Diagnosis not present

## 2024-06-21 DIAGNOSIS — R778 Other specified abnormalities of plasma proteins: Secondary | ICD-10-CM

## 2024-06-21 DIAGNOSIS — J9621 Acute and chronic respiratory failure with hypoxia: Secondary | ICD-10-CM | POA: Diagnosis not present

## 2024-06-21 DIAGNOSIS — R9431 Abnormal electrocardiogram [ECG] [EKG]: Secondary | ICD-10-CM | POA: Diagnosis not present

## 2024-06-21 DIAGNOSIS — R062 Wheezing: Secondary | ICD-10-CM | POA: Diagnosis not present

## 2024-06-21 DIAGNOSIS — I509 Heart failure, unspecified: Secondary | ICD-10-CM | POA: Diagnosis not present

## 2024-06-21 DIAGNOSIS — I5033 Acute on chronic diastolic (congestive) heart failure: Secondary | ICD-10-CM | POA: Diagnosis not present

## 2024-06-21 DIAGNOSIS — I2510 Atherosclerotic heart disease of native coronary artery without angina pectoris: Secondary | ICD-10-CM | POA: Diagnosis not present

## 2024-06-21 DIAGNOSIS — J449 Chronic obstructive pulmonary disease, unspecified: Secondary | ICD-10-CM | POA: Diagnosis not present

## 2024-06-21 DIAGNOSIS — R000 Tachycardia, unspecified: Secondary | ICD-10-CM | POA: Diagnosis not present

## 2024-06-21 NOTE — Telephone Encounter (Signed)
 ER needs a copy of all of patient's medication. Fax #[phone]. Please advise

## 2024-06-22 DIAGNOSIS — R778 Other specified abnormalities of plasma proteins: Secondary | ICD-10-CM | POA: Diagnosis not present

## 2024-06-22 DIAGNOSIS — I2510 Atherosclerotic heart disease of native coronary artery without angina pectoris: Secondary | ICD-10-CM | POA: Diagnosis not present

## 2024-06-22 DIAGNOSIS — I1310 Hypertensive heart and chronic kidney disease without heart failure, with stage 1 through stage 4 chronic kidney disease, or unspecified chronic kidney disease: Secondary | ICD-10-CM | POA: Diagnosis not present

## 2024-06-22 DIAGNOSIS — I509 Heart failure, unspecified: Secondary | ICD-10-CM | POA: Diagnosis not present

## 2024-06-23 DIAGNOSIS — I1310 Hypertensive heart and chronic kidney disease without heart failure, with stage 1 through stage 4 chronic kidney disease, or unspecified chronic kidney disease: Secondary | ICD-10-CM | POA: Diagnosis not present

## 2024-06-23 DIAGNOSIS — I2510 Atherosclerotic heart disease of native coronary artery without angina pectoris: Secondary | ICD-10-CM | POA: Diagnosis not present

## 2024-06-23 DIAGNOSIS — I509 Heart failure, unspecified: Secondary | ICD-10-CM | POA: Diagnosis not present

## 2024-06-23 DIAGNOSIS — J9600 Acute respiratory failure, unspecified whether with hypoxia or hypercapnia: Secondary | ICD-10-CM | POA: Diagnosis not present

## 2024-06-24 DIAGNOSIS — I517 Cardiomegaly: Secondary | ICD-10-CM | POA: Diagnosis not present

## 2024-06-24 DIAGNOSIS — I361 Nonrheumatic tricuspid (valve) insufficiency: Secondary | ICD-10-CM | POA: Diagnosis not present

## 2024-06-24 LAB — SURGICAL PATHOLOGY

## 2024-06-24 NOTE — Telephone Encounter (Signed)
 List faxed

## 2024-06-25 DIAGNOSIS — I2510 Atherosclerotic heart disease of native coronary artery without angina pectoris: Secondary | ICD-10-CM | POA: Diagnosis not present

## 2024-06-25 DIAGNOSIS — I509 Heart failure, unspecified: Secondary | ICD-10-CM | POA: Diagnosis not present

## 2024-06-25 DIAGNOSIS — J9600 Acute respiratory failure, unspecified whether with hypoxia or hypercapnia: Secondary | ICD-10-CM | POA: Diagnosis not present

## 2024-06-25 DIAGNOSIS — R778 Other specified abnormalities of plasma proteins: Secondary | ICD-10-CM | POA: Diagnosis not present

## 2024-06-26 DIAGNOSIS — J9600 Acute respiratory failure, unspecified whether with hypoxia or hypercapnia: Secondary | ICD-10-CM | POA: Diagnosis not present

## 2024-06-26 DIAGNOSIS — I1310 Hypertensive heart and chronic kidney disease without heart failure, with stage 1 through stage 4 chronic kidney disease, or unspecified chronic kidney disease: Secondary | ICD-10-CM | POA: Diagnosis not present

## 2024-06-26 DIAGNOSIS — I509 Heart failure, unspecified: Secondary | ICD-10-CM | POA: Diagnosis not present

## 2024-06-26 DIAGNOSIS — I2510 Atherosclerotic heart disease of native coronary artery without angina pectoris: Secondary | ICD-10-CM | POA: Diagnosis not present

## 2024-06-27 DIAGNOSIS — N400 Enlarged prostate without lower urinary tract symptoms: Secondary | ICD-10-CM | POA: Diagnosis not present

## 2024-06-27 DIAGNOSIS — I2510 Atherosclerotic heart disease of native coronary artery without angina pectoris: Secondary | ICD-10-CM | POA: Diagnosis not present

## 2024-06-27 DIAGNOSIS — R7989 Other specified abnormal findings of blood chemistry: Secondary | ICD-10-CM | POA: Diagnosis not present

## 2024-06-27 DIAGNOSIS — I5023 Acute on chronic systolic (congestive) heart failure: Secondary | ICD-10-CM | POA: Diagnosis not present

## 2024-06-27 DIAGNOSIS — E039 Hypothyroidism, unspecified: Secondary | ICD-10-CM | POA: Diagnosis not present

## 2024-06-27 DIAGNOSIS — J9620 Acute and chronic respiratory failure, unspecified whether with hypoxia or hypercapnia: Secondary | ICD-10-CM | POA: Diagnosis not present

## 2024-06-27 DIAGNOSIS — Z87891 Personal history of nicotine dependence: Secondary | ICD-10-CM | POA: Diagnosis not present

## 2024-06-27 DIAGNOSIS — D61818 Other pancytopenia: Secondary | ICD-10-CM | POA: Diagnosis not present

## 2024-06-27 DIAGNOSIS — Z860101 Personal history of adenomatous and serrated colon polyps: Secondary | ICD-10-CM | POA: Diagnosis not present

## 2024-06-27 DIAGNOSIS — E440 Moderate protein-calorie malnutrition: Secondary | ICD-10-CM | POA: Diagnosis not present

## 2024-06-27 DIAGNOSIS — R5381 Other malaise: Secondary | ICD-10-CM | POA: Diagnosis not present

## 2024-06-27 DIAGNOSIS — N189 Chronic kidney disease, unspecified: Secondary | ICD-10-CM | POA: Diagnosis not present

## 2024-06-27 DIAGNOSIS — N179 Acute kidney failure, unspecified: Secondary | ICD-10-CM | POA: Diagnosis not present

## 2024-06-27 DIAGNOSIS — J811 Chronic pulmonary edema: Secondary | ICD-10-CM | POA: Diagnosis not present

## 2024-06-27 DIAGNOSIS — I4891 Unspecified atrial fibrillation: Secondary | ICD-10-CM | POA: Diagnosis not present

## 2024-06-27 DIAGNOSIS — D819 Combined immunodeficiency, unspecified: Secondary | ICD-10-CM | POA: Diagnosis not present

## 2024-06-27 DIAGNOSIS — G4733 Obstructive sleep apnea (adult) (pediatric): Secondary | ICD-10-CM | POA: Diagnosis not present

## 2024-06-27 DIAGNOSIS — D509 Iron deficiency anemia, unspecified: Secondary | ICD-10-CM | POA: Diagnosis not present

## 2024-06-27 DIAGNOSIS — J9610 Chronic respiratory failure, unspecified whether with hypoxia or hypercapnia: Secondary | ICD-10-CM | POA: Diagnosis not present

## 2024-06-27 DIAGNOSIS — R0602 Shortness of breath: Secondary | ICD-10-CM | POA: Diagnosis not present

## 2024-06-27 DIAGNOSIS — R278 Other lack of coordination: Secondary | ICD-10-CM | POA: Diagnosis not present

## 2024-06-27 DIAGNOSIS — R1311 Dysphagia, oral phase: Secondary | ICD-10-CM | POA: Diagnosis not present

## 2024-06-27 DIAGNOSIS — J910 Malignant pleural effusion: Secondary | ICD-10-CM | POA: Diagnosis not present

## 2024-06-27 DIAGNOSIS — C9110 Chronic lymphocytic leukemia of B-cell type not having achieved remission: Secondary | ICD-10-CM | POA: Diagnosis present

## 2024-06-27 DIAGNOSIS — N183 Chronic kidney disease, stage 3 unspecified: Secondary | ICD-10-CM | POA: Diagnosis not present

## 2024-06-27 DIAGNOSIS — I509 Heart failure, unspecified: Secondary | ICD-10-CM | POA: Diagnosis not present

## 2024-06-27 DIAGNOSIS — F339 Major depressive disorder, recurrent, unspecified: Secondary | ICD-10-CM | POA: Diagnosis not present

## 2024-06-27 DIAGNOSIS — R2681 Unsteadiness on feet: Secondary | ICD-10-CM | POA: Diagnosis not present

## 2024-06-27 DIAGNOSIS — Z7189 Other specified counseling: Secondary | ICD-10-CM | POA: Diagnosis not present

## 2024-06-27 DIAGNOSIS — J449 Chronic obstructive pulmonary disease, unspecified: Secondary | ICD-10-CM | POA: Diagnosis not present

## 2024-06-27 DIAGNOSIS — M6259 Muscle wasting and atrophy, not elsewhere classified, multiple sites: Secondary | ICD-10-CM | POA: Diagnosis not present

## 2024-06-27 DIAGNOSIS — I480 Paroxysmal atrial fibrillation: Secondary | ICD-10-CM | POA: Diagnosis not present

## 2024-06-27 DIAGNOSIS — I5032 Chronic diastolic (congestive) heart failure: Secondary | ICD-10-CM | POA: Diagnosis not present

## 2024-06-27 DIAGNOSIS — R41841 Cognitive communication deficit: Secondary | ICD-10-CM | POA: Diagnosis not present

## 2024-06-27 DIAGNOSIS — R059 Cough, unspecified: Secondary | ICD-10-CM | POA: Diagnosis not present

## 2024-06-28 ENCOUNTER — Other Ambulatory Visit: Payer: Self-pay

## 2024-06-28 DIAGNOSIS — R5381 Other malaise: Secondary | ICD-10-CM | POA: Diagnosis not present

## 2024-06-28 DIAGNOSIS — J9610 Chronic respiratory failure, unspecified whether with hypoxia or hypercapnia: Secondary | ICD-10-CM | POA: Diagnosis not present

## 2024-06-28 DIAGNOSIS — C9110 Chronic lymphocytic leukemia of B-cell type not having achieved remission: Secondary | ICD-10-CM

## 2024-06-28 DIAGNOSIS — I5032 Chronic diastolic (congestive) heart failure: Secondary | ICD-10-CM | POA: Diagnosis not present

## 2024-06-28 DIAGNOSIS — J449 Chronic obstructive pulmonary disease, unspecified: Secondary | ICD-10-CM | POA: Diagnosis not present

## 2024-06-28 MED ORDER — VENETOCLAX 100 MG PO TABS
200.0000 mg | ORAL_TABLET | Freq: Every day | ORAL | 2 refills | Status: AC
Start: 2024-06-28 — End: ?

## 2024-07-01 DIAGNOSIS — J449 Chronic obstructive pulmonary disease, unspecified: Secondary | ICD-10-CM | POA: Diagnosis not present

## 2024-07-01 DIAGNOSIS — J9610 Chronic respiratory failure, unspecified whether with hypoxia or hypercapnia: Secondary | ICD-10-CM | POA: Diagnosis not present

## 2024-07-01 DIAGNOSIS — I5032 Chronic diastolic (congestive) heart failure: Secondary | ICD-10-CM | POA: Diagnosis not present

## 2024-07-01 DIAGNOSIS — R5381 Other malaise: Secondary | ICD-10-CM | POA: Diagnosis not present

## 2024-07-01 LAB — LAB REPORT - SCANNED: EGFR: 52

## 2024-07-04 DIAGNOSIS — C9110 Chronic lymphocytic leukemia of B-cell type not having achieved remission: Secondary | ICD-10-CM | POA: Diagnosis not present

## 2024-07-04 DIAGNOSIS — J449 Chronic obstructive pulmonary disease, unspecified: Secondary | ICD-10-CM | POA: Diagnosis not present

## 2024-07-05 ENCOUNTER — Encounter: Payer: Self-pay | Admitting: Oncology

## 2024-07-05 ENCOUNTER — Inpatient Hospital Stay

## 2024-07-05 ENCOUNTER — Inpatient Hospital Stay: Attending: Oncology | Admitting: Oncology

## 2024-07-05 ENCOUNTER — Other Ambulatory Visit: Payer: Self-pay | Admitting: Oncology

## 2024-07-05 ENCOUNTER — Telehealth: Payer: Self-pay | Admitting: Oncology

## 2024-07-05 VITALS — BP 140/70 | HR 97 | Temp 97.8°F | Resp 20 | Ht 69.0 in | Wt 177.9 lb

## 2024-07-05 DIAGNOSIS — N189 Chronic kidney disease, unspecified: Secondary | ICD-10-CM | POA: Insufficient documentation

## 2024-07-05 DIAGNOSIS — R7989 Other specified abnormal findings of blood chemistry: Secondary | ICD-10-CM | POA: Insufficient documentation

## 2024-07-05 DIAGNOSIS — Z860101 Personal history of adenomatous and serrated colon polyps: Secondary | ICD-10-CM | POA: Insufficient documentation

## 2024-07-05 DIAGNOSIS — J910 Malignant pleural effusion: Secondary | ICD-10-CM | POA: Insufficient documentation

## 2024-07-05 DIAGNOSIS — Z87891 Personal history of nicotine dependence: Secondary | ICD-10-CM | POA: Insufficient documentation

## 2024-07-05 DIAGNOSIS — C9110 Chronic lymphocytic leukemia of B-cell type not having achieved remission: Secondary | ICD-10-CM

## 2024-07-05 DIAGNOSIS — I4891 Unspecified atrial fibrillation: Secondary | ICD-10-CM | POA: Insufficient documentation

## 2024-07-05 DIAGNOSIS — J449 Chronic obstructive pulmonary disease, unspecified: Secondary | ICD-10-CM | POA: Insufficient documentation

## 2024-07-05 DIAGNOSIS — D61818 Other pancytopenia: Secondary | ICD-10-CM | POA: Insufficient documentation

## 2024-07-05 DIAGNOSIS — D819 Combined immunodeficiency, unspecified: Secondary | ICD-10-CM | POA: Diagnosis not present

## 2024-07-05 DIAGNOSIS — I509 Heart failure, unspecified: Secondary | ICD-10-CM | POA: Insufficient documentation

## 2024-07-05 DIAGNOSIS — D509 Iron deficiency anemia, unspecified: Secondary | ICD-10-CM | POA: Insufficient documentation

## 2024-07-05 NOTE — Telephone Encounter (Signed)
 Patient has been scheduled for follow-up visit per 07/05/24 LOS.  Pt given an appt calendar with date and time.

## 2024-07-05 NOTE — Progress Notes (Signed)
 [facility]  [address] [place],  [place]  [address] [phone]  Clinic Day:  07/05/2024  Referring physician: Keren Vicenta BRAVO, MD  ASSESSMENT & PLAN:  Assessment: CLL (chronic lymphocytic leukemia) This was just diagnosed in July and associated with anemia, thrombocytopenia, malignant pleural effusion, and generalized adenopathy. He was therefore treated with venetoclax  and has been on that for over one month now. He had a severe allergic reaction to rituximab. I agree with this choice of treatment and will follow him for supportive care as his local hematologist. He has developed worsening pancytopenia from the Ventoclax, but his platelets have come up nicely to 111,000 and his ANC is still maintained at 1800. We will keep him on the same dose and hopefully get approved for coverage of this since it is working so successfully for him and he is tolerating it well.  Iron deficiency anemia He remains on oral iron supplement, but also did receive IV Ferrlecit for 4 days in July 2025. GI evaluation did reveal 2 tubular adenomas of the colon and mild reactive gastritis.   Thrombocytopenia This is relatively mild, but has improved. We will monitor it regularly.   Combined immunodeficiency This is due to his CLL and his IgG level was decreased to 392, IgA decreased to 48, and normal IgM of 37. I would only recommend immunoglobulin infusions if he had serious frequent infections.   Atrial fibrillation He is back on Plavix  after it was on hold during his GI bleed so we will need to monitor his counts regularly.   Bilateral pleural effusions These were tapped several times while in the hospital and one note says they were negative for cancer, but I did find a report that revealed the malignant lymphocytes in the pleural fluid.  Hopefully the venetoclax  to treat his CLL will help the pleural effusion.  Congestive heart failure His EF was good and his dose of Bumex was  decreased to 0.5 mg, but I agree he needs 1 mg daily with the degree of edema he currently has and the persistence of his bilateral pleural effusions.   Chronic kidney disease This was so severe that he was temporarily on dialysis during his hospitalization. His parameters have improved quite a bit so I think he can tolerate the increased diuresis.   COPD He was recently hospitalized for severe dyspnea and this appears to be due to exacerbation of COPD along with congestive heart failure and possible pneumonia. He has responded to IV antibiotics and diuresis. I had a discussion about code status and palliative care. He does not wish intubation in the event of a cardiorespiratory arrest, but would still like all else done, including aggressive treatment of his CLL.   Plan: He feels somewhat improved after the hospital, but still has significant dyspnea with exertion. He was brought by ambulance to [facility] ER for sinus tachycardia, dyspnea, hypoxemia, and respiratory distress. He was found to have oxygen  saturation of 84% on 3L nasal cannula. He stayed in [facility] ICU 06/21/2024 to 06/24/2024. He had an echocardiogram on 06/23/2024 which revealed a left ventricular EF of 55-60%. Chest X-ray revealed congestion with mild bilateral pleural effusions. He was placed on BiPAP and given IV Lopressor, Lasix , steroids, and antibiotics including cefepime and doxycycline to treat for possible pneumonia. He was transferred to [facility] facility on 06/27/2024 for a course of rehab and currently takes 20 mg oral torsemide BID. He was seen in the hospital by Dr. Mardee  and Dr. Monetta and they will follow-up with him. He affirms that he still has intermittent shortness of breath and coughing. He states that he receives nebulizer treatments. I discussed palliative care with him and he wishes to have DNI (do not intubate) orders. He had labs completed at [facility] on 07/04/2024 which revealed  a low WBC of 2.2 with an ANC of 1800 down from 4.3 with an ANC of 2000, a low hemoglobin of 9.2 down from 10.7, and a low platelet count of 111,000 improved from 80,000. His CMP revealed a low calcium  of 8.3 down from 8.9, an elevated creatinine of 1.38 up from 1.26, and an elevated BUN of 25.8 up from 21. He has been seen at the [facility] by Dr. Haroldine, hematologist/oncologist, but she did not have the records from [place]. I will be sure to send our information, but it is a hardship for the patient to travel to [place] in his ill, frail, oxygen -dependent state. He is hoping he will be approved for [facility] so that he can have his follow-up and treatment here. He inquired about if I recommend for him to receive a COVID vaccination, and I do. I will see him back in 4 weeks with CBC and CMP for close follow-up and supportive care. I discussed the assessment and treatment plan with the patient.  The patient was provided an opportunity to ask questions and all were answered.  The patient agreed with the plan and demonstrated an understanding of the instructions.  The patient was advised to call back if the symptoms worsen or if the condition fails to improve as anticipated.  I provided 48 minutes of face-to-face time during this this encounter and > 50% was spent counseling as documented under my assessment and plan.   [name], MD [facility] - A DEPT OF [facility] [address] [place] [address] Dept: [phone] Dept Fax: [phone]   CHIEF COMPLAINT:  CC: CLL (chronic lymphocytic leukemia)  Current Treatment:  200 mg Venetoclax  daily   HISTORY OF PRESENT ILLNESS:  Andre Ross is a 78 y.o. male with a CLL just diagnosed in July of 2025 who is referred in consultation with Dr. Johnie Door, MD for assessment and management.   On 03/09/2024 he was admitted to [facility] for a lower GI bleed and was found to  have an elevated WBC of 13.7 with an ANC of 5.21, a low hemoglobin of 10.4, and mild thrombocytopenia with a platelet count of 105,000. Flow cytometry revealed monoclonal B-cells which were positive for CD5 and CD23 and was lambda light chain restricted. On 03/19/2024 he had a left supraclavicular node biopsy which revealed involvement by CD5-positive B cell lymphoma with increased Ki-67 proliferation index of 30-40%. 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 There was a uniform population of small to intermediate sized lymphocytes which were positive for CD20, CD5, CD23, PAX8, BCL2, and CD21. Cyclin-D1 was negative. He had multiple thoracenteses for bilateral pleural effusion. They obtained 1180cc from the left side which was negative for cancer. However, I did find a report in his records of his monoclonal B-cell lymphocytes in the pleural fluid. Serum immunoglobulins were decreased with an IgG of 392, IgA of 48, and normal IgM of 37. He was given 1 dose of IV Rituxan and had a major infusion reaction and it was stopped. They considered a Bruton kinase inhibitor, but eventually placed the patient on venetoclax  with  a current dose of 200 mg daily with good response. CT scans did reveal extensive lower cervical, hilar, mediastinal, and upper abdominal adenopathy as well as retroperitoneal adenopathy. He was severely anemic with a hemoglobin at 8.2 and found to be iron deficient with an iron saturation of 7% and iron level of 31. He was given IV iron in the form of Ferrlecit for 4 days in the hospital. He remains on oral iron supplement. When he left the hospital, his WBC was 8.1, hemoglobin 8.2, and platelet count 108,000. His creatinine was 1.6 with a BUN of 24 at that time. He was started on the venetoclax  in early September 2025. On 05/07/2024, his WBC was down to 2.5, hemoglobin down to 8.2, and platelet count down to 89,000.  He has a history of COPD, lower GI bleed, CKD (he required dialysis temporarily while  in the hospital), CHF, respiratory failure, atrial fibrillation, coronary artery disease, peripheral neuropathy, gout, hypothyroidism, BPH, and bilateral pleural effusions. However, his echocardiogram revealed an ejection fraction of 60-65%. During his hospitalization, he did have colonoscopy on 03/18/2024 with findings of tubular adenomas in the cecum and sigmoid colon. EGD on that same date revealed mild reactive gastritis. He was discharged on Lasix , but later changed to Bumex 0.5 mg daily. His cardiologist recently increased the dose to 1 mg daily, and I agree since he has significant edema at this time and persistent pleural effusions.   I have reviewed his chart and materials related to his cancer extensively and collaborated history with the patient. Summary of oncologic history is as follows: Oncology History  CLL (chronic lymphocytic leukemia) (HCC)  03/19/2024 Cancer Staging   Staging form: Chronic Lymphocytic Leukemia / Small Lymphocytic Lymphoma, AJCC 8th Edition - Clinical stage from 03/19/2024: Modified Rai Stage IV (Modified Rai risk: High, Binet: Stage B, Lugano: Stage IV, Lymphocytosis: Present, Adenopathy: Present, Organomegaly: Absent, Anemia: Present, Thrombocytopenia: Present) - Signed by Cornelius Wanda DEL, MD on 06/18/2024 Histopathologic type: B-cell lymphocytic leukemia/small lymphocytic lymphoma (see also [id number]/[id number]) Stage prefix: Initial diagnosis Absolute lymphocyte count (ALC) (cells/uL): 12 Hemoglobin (Hgb) (g/dL): 89.5 Platelet count (x10E9/L of blood): 105 Diagnostic confirmation: Positive histology PLUS positive immunophenotyping and/or positive genetic studies Specimen type: Lymph Node Biopsy Staged by: Managing physician TP53 mutation: Positive Prognostic indicators: Malignant pleural effusion Stage used in treatment planning: Yes National guidelines used in treatment planning: Yes Type of national guideline used in treatment planning: NCCN   05/08/2024 Initial  Diagnosis   CLL (chronic lymphocytic leukemia) (HCC)    INTERVAL HISTORY:   Tymel is seen in the clinic for follow up of his CLL. This was discovered during his hospitalization at [facility] from 03/09/2024-04/26/2024 for a lower GI bleed. He was found to have an elevated WBC of 13.7 with an ANC of 5.21, a low hemoglobin of 10.4, and mild thrombocytopenia with a platelet count of 105,000. He was given IV iron and continues oral iron supplement without significant difficulty. A colonoscopy performed on 03/18/2024 revealed one large cecal polyp and one small sigmoid colon polyp. Pathology revealed these were tubular adenomas. He was started on Rituxan which resulted in an allergic reaction involving severe chills. He was switched to 200 mg venetoclax  daily which he has been taking without difficulty since early September. He is experiencing hair loss and easy bruising, but denies epistaxis. He has atrial fibrillation and is currently taking 75 mg Plavix  daily for this. Patient states that he feels somewhat improved after the hospital, but still has significant dyspnea  with exertion. He was BIB ambulance to [facility] ER for sinus tachycardia, dyspnea, hypoxemia, and respiratory distress. He was found to have oxygen  saturation of 84% on 3L nasal cannula. He stayed in [facility] ICU 06/21/2024 to 06/24/2024. He had an echocardiogram on 06/23/2024 which revealed a left ventricular EF of 55-60%. Chest X-ray revealed congestion with mild bilateral pleural effusions. He was placed on BiPAP and given IV Lopressor, Lasix , steroids, and antibiotics including cefepime and doxycycline to treat for possible pneumonia. He was transferred to [facility] skilled nursing facility on 06/27/2024 for a course of rehab and currently takes 20 mg oral torsemide BID. He was seen in the hospital by Dr. Mardee and Dr. Monetta and they will follow-up with him. He affirms that he still has intermittent shortness of breath  and coughing. He states that he receives nebulizer treatments. I discussed palliative care with him and he wishes to have DNI (do not intubate) orders. He had labs completed at [facility] on 07/04/2024 which revealed a low WBC of 2.2 with an ANC of 1800 down from 4.3 with an ANC of 2000, a low hemoglobin of 9.2 down from 10.7, and a low platelet count of 111,000 improved from 80,000. His CMP revealed a low calcium  of 8.3 down from 8.9, an elevated creatinine of 1.38 up from 1.26, and an elevated BUN of 25.8 up from 21. He has been seen at the [facility] by Dr. Haroldine, hematologist/oncologist, but she did not have the records from [place]. I will be sure to send our information, but it is a hardship for the patient to travel to [place] in his ill, frail, oxygen -dependent state. He is hoping he will be approved for [facility] so that he can have his follow-up and treatment here. He inquired about if I recommend for him to receive a COVID vaccination, and I do. I will see him back in 4 weeks with CBC and CMP for close follow-up and supportive care.   He denies fever, chills, night sweats, or other signs of infection. He denies gastrointestinal issues. He  denies pain. His appetite is variable and His weight has decreased 4 pounds over last 5 weeks.  HISTORY:   Past Medical History:  Diagnosis Date   (HFpEF) heart failure with preserved ejection fraction (HCC) 03/10/2024   Actinic keratosis 06/06/2024   Acute gastrointestinal bleeding 03/28/2018   Acute non-ST elevation myocardial infarction (NSTEMI) (HCC) 06/06/2024   Acute on chronic combined systolic and diastolic CHF (congestive heart failure) (HCC) 03/28/2018   Acute on chronic respiratory failure with hypoxemia (HCC) 03/10/2024   Acute respiratory failure (HCC) 07/21/2021   Adjustment disorder with mixed anxiety and depressed mood 06/06/2024   Age-related nuclear cataract, bilateral 06/06/2024   AKI (acute kidney injury) 04/20/2024   Alcohol   abuse 03/28/2018   Allergic rhinitis 06/06/2024   Anemia 06/06/2024   Aspiration pneumonia (HCC) 04/20/2024   B-cell lymphoproliferative disorder (HCC) 03/23/2024   Benign prostatic hyperplasia 06/06/2024   Chronic suppurative otitis media 06/06/2024   CKD (chronic kidney disease) stage 3, GFR 30-59 ml/min (HCC) 03/28/2018   CLL (chronic lymphocytic leukemia) (HCC) 05/08/2024   Cobalamin deficiency 08/22/2005   Congestive heart failure (HCC) 06/06/2024   COPD with acute exacerbation (HCC) 07/21/2021   Coronary artery disease involving native coronary artery 12/07/2023   Degeneration of intervertebral disc of lumbar region 06/06/2024   Diverticular hemorrhage 03/28/2018   Diverticulosis 03/10/2024   Dyspnea, unspecified 06/06/2024   Dystrophia unguium 06/06/2024   Elevated troponin 07/21/2021  Encounter for other preprocedural examination 06/06/2024   Encounter for surgical aftercare following surgery on the sense organs 06/06/2024   Enlarged prostate 06/06/2024   Essential (primary) hypertension 06/06/2024   Gastroesophageal reflux disease 06/06/2024   Gout 06/06/2024   Hallux valgus with bunions 06/06/2024   Heart failure, unspecified (HCC) 06/06/2024   History of malignant neoplasm of prostate 06/06/2024   Hyperkalemia 07/21/2021   Hyperlipidemia 03/28/2018   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 and stage 3 chronic kidney disease (HCC) 03/28/2018   Hypertensive urgency 07/21/2021   Hypothyroidism 06/06/2024   Iron deficiency anemia 06/06/2024   Left ventricular systolic dysfunction 04/24/2018   Segmental LAD distribution EF 45-50%   Localized edema 06/06/2024   Low back pain 06/06/2024   Major depression 06/06/2024   Malaise 06/06/2024   Male erectile disorder 06/06/2024   Mood disorder 06/06/2024   Nightmares associated with chronic post-traumatic stress disorder 06/06/2024   NSTEMI (non-ST elevated myocardial infarction)  (HCC) 05/15/2022   Onychomycosis due to dermatophyte 03/21/2019   OSA (obstructive sleep apnea) 12/07/2023   Osteoarthritis of hip 06/06/2024   Jan 22, 2023 Entered By: VINIE ALLEAN AQUAS Comment: left     Other hammer toe(s) (acquired), unspecified foot 06/06/2024   Other idiopathic peripheral autonomic neuropathy 06/06/2024   Other long term (current) drug therapy 06/06/2024   Other seborrheic dermatitis 06/06/2024   Oxygen  dependent 06/06/2024   Pain and swelling of right ankle 12/07/2023   Pain in left foot 06/06/2024   Pain in right foot 06/06/2024   Jul 16, 2023 Entered By: VINIE ALLEAN AQUAS Comment: X-ray-heel spur, hallux valgus, evidence of previous healed fractures     Paroxysmal atrial fibrillation (HCC) 04/20/2024   Pes planus 06/06/2024   Pleural effusion 04/20/2024   Prediabetes 06/06/2024   Presence of intraocular lens 06/06/2024   Primary basal cell carcinoma (BCC) of eyelid of left eye 06/06/2024   Prostate cancer (HCC)    PTSD (post-traumatic stress disorder) 03/28/2018   Sensorineural hearing loss (SNHL) 06/06/2024   Sepsis due to pneumonia (HCC) 07/21/2021   Severe combined immunodeficiency disease (HCC) 05/08/2024   Talipes valgus 06/06/2024   TIA (transient ischemic attack) 03/28/2018   Valgus deformity of great toe 06/06/2024    Past Surgical History:  Procedure Laterality Date   APPENDECTOMY     CORONARY STENT INTERVENTION N/A 05/16/2022   Procedure: CORONARY STENT INTERVENTION;  Surgeon: Wonda Sharper, MD;  Location: [facility] INVASIVE CV LAB;  Service: Cardiovascular;  Laterality: N/A;   INGUINAL HERNIA REPAIR     INSERTION PROSTATE RADIATION SEED     LEFT HEART CATH AND CORONARY ANGIOGRAPHY N/A 05/16/2022   Procedure: LEFT HEART CATH AND CORONARY ANGIOGRAPHY;  Surgeon: Wonda Sharper, MD;  Location: [facility] INVASIVE CV LAB;  Service: Cardiovascular;  Laterality: N/A;   STOMACH SURGERY     TONSILLECTOMY      Family History  Problem Relation Age of Onset    COPD Father    Heart disease Father    Heart attack Father    Hypertension Maternal Grandmother    Hypertension Paternal Grandmother     Social History:  reports that he quit smoking about 37 years ago. His smoking use included cigarettes. He started smoking about 67 years ago. He has a 30 pack-year smoking history. He has never used smokeless tobacco. He reports current alcohol  use of about 4.0 - 6.0 standard drinks of alcohol  per week. He reports that he does not currently use drugs.The patient is alone today,  but was brought to the office by his son  Allergies:  Allergies  Allergen Reactions   Aspirin  Anaphylaxis and Other (See Comments)    GI upset  Other Reaction(s): Unknown/See Comments  ULCERATED STOMACH  -PT STATES TAKING LOW DOSE CURRENTLY  [facility] 08/14/22   Hydrocodone Itching and Hives    Current Medications: Current Outpatient Medications  Medication Sig Dispense Refill   atorvastatin  (LIPITOR) 40 MG tablet BEDTIME     ipratropium-albuterol  (DUONEB) 0.5-2.5 (3) MG/3ML SOLN Q6H as needed for Shortness Of Breath     Tiotropium Bromide  2.5 MCG/ACT AERS daily.     acetaminophen  (TYLENOL ) 500 MG tablet Take 1 tablet by mouth 4 (four) times daily as needed for moderate pain or headache.     albuterol  (PROVENTIL ) (2.5 MG/3ML) 0.083% nebulizer solution Take 2.5 mg by nebulization every 4 (four) hours as needed for wheezing or shortness of breath.     albuterol  (VENTOLIN  HFA) 108 (90 Base) MCG/ACT inhaler Inhale 2 puffs into the lungs every 4 (four) hours as needed for wheezing or shortness of breath.     allopurinol  (ZYLOPRIM ) 300 MG tablet Take 300 mg by mouth daily.     amLODipine  (NORVASC ) 2.5 MG tablet Take 1 tablet (2.5 mg total) by mouth daily. 180 tablet 3   ammonium lactate (LAC-HYDRIN) 12 % lotion Apply 1 Application topically as needed for dry skin.     bumetanide (BUMEX) 0.5 MG tablet Take 0.5 mg by mouth daily.     Cholecalciferol  (VITAMIN D ) 2000 units tablet  Take 2,000 Units by mouth daily.     clopidogrel  (PLAVIX ) 75 MG tablet Take 75 mg by mouth daily.     Docusate Sodium (DSS) 100 MG CAPS Take 100 mg by mouth daily as needed (constipation).     ferrous sulfate  325 (65 FE) MG tablet Take 325 mg by mouth 2 (two) times daily after a meal.     fluticasone (FLONASE) 50 MCG/ACT nasal spray Place 1 spray into both nostrils 2 (two) times daily.     folic acid  (FOLVITE ) 1 MG tablet Take 1 mg by mouth daily.     gabapentin  (NEURONTIN ) 300 MG capsule Take 300 mg by mouth at bedtime.     hydrocortisone cream 1 % Apply 1 Application topically 2 (two) times daily.     ketoconazole (NIZORAL) 2 % shampoo Apply 1 Application topically 2 (two) times a week.     ketotifen (ZADITOR) 0.025 % ophthalmic solution Place 1 drop into both eyes 2 (two) times daily.     levothyroxine  (SYNTHROID ) 50 MCG tablet Take 50 mcg by mouth daily before breakfast.     loratadine  (CLARITIN ) 10 MG tablet Take 10 mg by mouth daily as needed for allergies.     losartan (COZAAR) 25 MG tablet Take 12.5 mg by mouth daily.     Multiple Vitamin (MULTIVITAMIN WITH MINERALS) TABS tablet Take 1 tablet by mouth daily.     nitroGLYCERIN  (NITROSTAT ) 0.4 MG SL tablet Place 1 tablet (0.4 mg total) under the tongue every 5 (five) minutes x 3 doses as needed for chest pain. 25 tablet 3   pantoprazole  (PROTONIX ) 40 MG tablet Take 40 mg by mouth daily.     rosuvastatin (CRESTOR) 10 MG tablet Take 5 mg by mouth.     sertraline (ZOLOFT) 50 MG tablet Take 25 mg by mouth.     tamsulosin  (FLOMAX ) 0.4 MG CAPS capsule Take 0.4 mg by mouth.     Tiotropium Bromide  Monohydrate (SPIRIVA  RESPIMAT) 1.25  MCG/ACT AERS Inhale 2 Pump into the lungs daily.     triamcinolone  (KENALOG ) 0.025 % cream Apply 1 Application topically 2 (two) times daily.     venetoclax  (VENCLEXTA ) 100 MG tablet Take 2 tablets (200 mg total) by mouth daily. 60 tablet 2   vitamin B-12 (CYANOCOBALAMIN) 500 MCG tablet Take 1,000 mcg by mouth daily.      No current facility-administered medications for this visit.    REVIEW OF SYSTEMS:  Review of Systems  Constitutional:  Positive for fatigue.  HENT:   Positive for hearing loss (hard of hearing). Negative for lump/mass, mouth sores and nosebleeds.   Eyes: Negative.   Respiratory:  Positive for cough (mild) and shortness of breath (on oxygen ).   Cardiovascular:  Positive for leg swelling (history of CHF).  Gastrointestinal:  Positive for abdominal pain (lower abdominal pain 4/10).  Endocrine: Negative.   Genitourinary: Negative.    Musculoskeletal:  Negative for gait problem.  Skin: Negative.   Neurological:  Negative for gait problem.  Hematological:  Bruises/bleeds easily.  Psychiatric/Behavioral: Negative.  Negative for confusion. The patient is not nervous/anxious.     VITALS:  Blood pressure (!) 140/70, pulse 97, temperature 97.8 F (36.6 C), temperature source Oral, resp. rate 20, height 5' 9 (1.753 m), weight 177 lb 14.4 oz (80.7 kg), SpO2 98%.  Wt Readings from Last 3 Encounters:  07/05/24 177 lb 14.4 oz (80.7 kg)  06/07/24 189 lb (85.7 kg)  05/31/24 181 lb 1.6 oz (82.1 kg)    Body mass index is 26.27 kg/m.  Performance status (ECOG): 1 - Symptomatic but completely ambulatory  PHYSICAL EXAM:  Physical Exam Vitals and nursing note reviewed.  HENT:     Head: Normocephalic and atraumatic.  Cardiovascular:     Rate and Rhythm: Normal rate.     Heart sounds: Normal heart sounds.  Pulmonary:     Breath sounds: Examination of the right-lower field reveals decreased breath sounds. Examination of the left-lower field reveals decreased breath sounds. Decreased breath sounds present.  Musculoskeletal:     Right lower leg: Edema (mild) present.     Left lower leg: Edema (mild) present.  Lymphadenopathy:     Cervical: No cervical adenopathy.     Upper Body:     Right upper body: No supraclavicular, axillary, pectoral or epitrochlear adenopathy.     Left upper body: No  supraclavicular, axillary, pectoral or epitrochlear adenopathy.  Neurological:     Mental Status: He is alert.    LABS:      Latest Ref Rng & Units 05/31/2024    3:09 PM 06/02/2022    3:45 PM 05/17/2022    2:16 AM  CBC  WBC 4.0 - 10.5 K/uL 4.3  10.5  11.3   Hemoglobin 13.0 - 17.0 g/dL 89.2  87.0  89.6   Hematocrit 39.0 - 52.0 % 33.0  39.6  31.4   Platelets 150 - 400 K/uL 80  243  179       Latest Ref Rng & Units 05/31/2024    3:09 PM 06/02/2022    3:45 PM 05/17/2022    2:16 AM  CMP  Glucose 70 - 99 mg/dL 91  890  896   BUN 8 - 23 mg/dL 21  27  33   Creatinine 0.61 - 1.24 mg/dL 8.73  8.62  8.64   Sodium 135 - 145 mmol/L 142  144  141   Potassium 3.5 - 5.1 mmol/L 4.0  4.0  3.9   Chloride 98 -  111 mmol/L 102  104  111   CO2 22 - 32 mmol/L 24  24  23    Calcium  8.9 - 10.3 mg/dL 8.9  9.7  8.4   Total Protein 6.5 - 8.1 g/dL 6.8     Total Bilirubin 0.0 - 1.2 mg/dL 0.8     Alkaline Phos 38 - 126 U/L 100     AST 15 - 41 U/L 40     ALT 0 - 44 U/L 21        No results found for: CEA1, CEA / No results found for: CEA1, CEA No results found for: PSA1 No results found for: CAN199 No results found for: CAN125  No results found for: TOTALPROTELP, ALBUMINELP, A1GS, A2GS, BETS, BETA2SER, GAMS, MSPIKE, SPEI Lab Results  Component Value Date   TIBC 371 05/31/2024   FERRITIN 345 (H) 05/31/2024   IRONPCTSAT 24 05/31/2024   Lab Results  Component Value Date   LDH 232 (H) 05/31/2024    STUDIES:  EXAM: 04/23/2024 XR CHEST  IMPRESSION: -Cardiomegaly with mild pulmonary venous congestion and small bilateral pleural effusions, similar to prior study.    EXAM: 04/16/2024 XR ABDOMEN  IMPRESSION: 1.  Nonobstructed bowel gas pattern.  2.  Small bilateral pleural effusions with bibasilar atelectasis or infection.   EXAM: 03/25/2024 US  RENAL  IMPRESSION: Increased renal cortical echogenicity suggesting medical renal disease. There is no  hydronephrosis. There is an apparent left pleural effusion.   EXAM: 03/18/2024 CT ABDOMEN PELVIS W IV CONTRAST IMPRESSION: 1. Hepatosplenomegaly. Indeterminate upper abdominal, mediastinal, hilar, and retroperitoneal adenopathy. Findings may relate to a lymphoproliferative disorder/lymphoma. 2. Cholelithiasis with stones in the gallbladder neck. 3. Colonic diverticulosis. 4. Possible wall thickening of the rectum, as can be seen with proctitis. 5. Large hiatal hernia. 6. Atelectasis/consolidation in the lung bases. 7. Left greater than right pleural effusions.   EXAM: 03/12/2024 CT CHEST WO CONTRAST IMPRESSION: 1. Persistent moderate to large bilateral pleural effusions. 2. Mediastinal and upper abdominal adenopathy with splenomegaly. Differential considerations include lymphoma and metastatic disease. 3. Emphysema. 4. Cholelithiasis.  EXAM: 03/12/2024 CT ANGIO CHEST PULMONARY IMPRESSION: 1. No evidence of pulmonary embolus. 2. Significant lower cervial, mediastinal and upper abdominal adenopathy unchanged. 3. Significant bilateral pleural effusions LEFT greater than RIGHT with compressive atelectasis. 4. Emphysema  EXAM: 03/12/2024 ECHOCARDIOGRAM THORACENTESIS CONCLUSIONS: Left ventricular size is normal Left ventricular ejection fraction is 50-55% The left atrium is slightly dilated There is trivial mitral regurgitation There is mild aortic sclerosis without evidence of stenosis There is trace aortic regurgitation Pulmonary artery systolic pressure is mild-to-moderately elevated There is pleural effusion observed   I,Jandiel Magallanes H Rosmarie Esquibel,acting as a scribe for [name], MD.,have documented all relevant documentation on the behalf of [name], MD,as directed by  [name], MD while in the presence of [name], MD.   I have reviewed this report as typed by the medical scribe, and it is complete and accurate

## 2024-07-08 DIAGNOSIS — Z7189 Other specified counseling: Secondary | ICD-10-CM | POA: Diagnosis not present

## 2024-07-10 DIAGNOSIS — J9610 Chronic respiratory failure, unspecified whether with hypoxia or hypercapnia: Secondary | ICD-10-CM | POA: Diagnosis not present

## 2024-07-10 DIAGNOSIS — J449 Chronic obstructive pulmonary disease, unspecified: Secondary | ICD-10-CM | POA: Diagnosis not present

## 2024-07-10 DIAGNOSIS — I5032 Chronic diastolic (congestive) heart failure: Secondary | ICD-10-CM | POA: Diagnosis not present

## 2024-07-10 DIAGNOSIS — R5381 Other malaise: Secondary | ICD-10-CM | POA: Diagnosis not present

## 2024-07-18 ENCOUNTER — Other Ambulatory Visit: Payer: Self-pay | Admitting: Oncology

## 2024-07-18 DIAGNOSIS — C9110 Chronic lymphocytic leukemia of B-cell type not having achieved remission: Secondary | ICD-10-CM

## 2024-07-30 ENCOUNTER — Inpatient Hospital Stay: Admitting: Hematology and Oncology

## 2024-07-30 ENCOUNTER — Inpatient Hospital Stay

## 2024-07-30 ENCOUNTER — Inpatient Hospital Stay: Attending: Hematology and Oncology

## 2024-07-30 ENCOUNTER — Telehealth: Payer: Self-pay | Admitting: Hematology and Oncology

## 2024-07-30 DIAGNOSIS — C9110 Chronic lymphocytic leukemia of B-cell type not having achieved remission: Secondary | ICD-10-CM | POA: Insufficient documentation

## 2024-07-30 DIAGNOSIS — Z9221 Personal history of antineoplastic chemotherapy: Secondary | ICD-10-CM | POA: Insufficient documentation

## 2024-07-30 DIAGNOSIS — Z87891 Personal history of nicotine dependence: Secondary | ICD-10-CM | POA: Insufficient documentation

## 2024-07-30 LAB — CMP (CANCER CENTER ONLY)
ALT: 16 U/L (ref 0–44)
AST: 30 U/L (ref 15–41)
Albumin: 3.9 g/dL (ref 3.5–5.0)
Alkaline Phosphatase: 109 U/L (ref 38–126)
Anion gap: 13 (ref 5–15)
BUN: 23 mg/dL (ref 8–23)
CO2: 24 mmol/L (ref 22–32)
Calcium: 9.5 mg/dL (ref 8.9–10.3)
Chloride: 107 mmol/L (ref 98–111)
Creatinine: 1.14 mg/dL (ref 0.61–1.24)
GFR, Estimated: >60 mL/min (ref >60)
Glucose, Bld: 107 mg/dL — ABNORMAL HIGH (ref 70–99)
Potassium: 4.3 mmol/L (ref 3.5–5.1)
Sodium: 143 mmol/L (ref 135–145)
Total Bilirubin: 0.9 mg/dL (ref 0.0–1.2)
Total Protein: 6.2 g/dL — ABNORMAL LOW (ref 6.5–8.1)

## 2024-07-30 LAB — CBC WITH DIFFERENTIAL (CANCER CENTER ONLY)
Abs Immature Granulocytes: 0.01 K/uL (ref 0.00–0.07)
Basophils Absolute: 0 K/uL (ref 0.0–0.1)
Basophils Relative: 0 %
Eosinophils Absolute: 0 K/uL (ref 0.0–0.5)
Eosinophils Relative: 0 %
HCT: 32.9 % — ABNORMAL LOW (ref 39.0–52.0)
Hemoglobin: 10.6 g/dL — ABNORMAL LOW (ref 13.0–17.0)
Immature Granulocytes: 0 %
Lymphocytes Relative: 23 %
Lymphs Abs: 0.6 K/uL — ABNORMAL LOW (ref 0.7–4.0)
MCH: 30.3 pg (ref 26.0–34.0)
MCHC: 32.2 g/dL (ref 30.0–36.0)
MCV: 94 fL (ref 80.0–100.0)
Monocytes Absolute: 1.1 K/uL — ABNORMAL HIGH (ref 0.1–1.0)
Monocytes Relative: 40 %
Neutro Abs: 1 K/uL — ABNORMAL LOW (ref 1.7–7.7)
Neutrophils Relative %: 37 %
Platelet Count: 52 K/uL — ABNORMAL LOW (ref 150–400)
RBC: 3.5 MIL/uL — ABNORMAL LOW (ref 4.22–5.81)
RDW: 20.1 % — ABNORMAL HIGH (ref 11.5–15.5)
WBC Count: 2.7 K/uL — ABNORMAL LOW (ref 4.0–10.5)
nRBC: 0 % (ref 0.0–0.2)

## 2024-07-30 NOTE — Progress Notes (Signed)
 [facility]  [address] [place],  [place]  [address] [phone]  Clinic Day:  07/30/2024  Referring physician: Keren Vicenta BRAVO, MD  ASSESSMENT & PLAN:   Assessment & Plan: No problem-specific Assessment & Plan notes found for this encounter.    The patient understands the plans discussed today and is in agreement with them.  He knows to contact our office if he develops concerns prior to his next appointment.   I provided *** minutes of face-to-face time during this encounter and > 50% was spent counseling as documented under my assessment and plan.    Elvan Ebron A Mariabelen Pressly, PA-C  [facility] [place] - A DEPT OF [facility] [address] [place] [address] Dept: [phone] Dept Fax: [phone]   No orders of the defined types were placed in this encounter.     CHIEF COMPLAINT:  CC: Chronic lymphocytic anemia  Current Treatment:  Venetoclax   HISTORY OF PRESENT ILLNESS:  Andre Ross is a 78 y.o. male with a CLL diagnosed in July 2025.  He was admitted to [facility] for a lower GI bleed and was found to have an elevated WBCs 13.7 with an ANC of 5.21, a low hemoglobin of 10.4, and mild thrombocytopenia with a platelet count of 105,000. Flow cytometry revealed monoclonal B-cells which were positive for CD5 and CD23 and was lambda light chain restricted. 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 There was a uniform population of small to intermediate sized lymphocytes which were positive for CD20, CD5, CD23, PAX8, BCL2, and CD21. Cyclin-D1 was negative. He had bilateral pleural effusions requiring multiple thoracentesis. They obtained 1180cc from the left side, which was negative for cancer. However, there was a report in his records of  his monoclonal B-cell lymphocytes in the pleural fluid. Serum immunoglobulins were decreased with an IgG of 392, IgA of 48, and normal IgM of 37. CT scans did reveal extensive lower cervical, hilar, mediastinal, and upper abdominal adenopathy as well as retroperitoneal adenopathy. He was severely anemic with a hemoglobin at 8.2 and found to be iron deficient with an iron saturation of 7% and iron level of 31. He was given IV iron in the form of Ferrlecit for 4 days in the hospital.  He remains on oral iron supplement.  He was given 1 dose of IV Rituxan and had a major infusion reaction so this was discontinued. WBCs were 8.1, hemoglobin 8.2, and platelet count 108,000 at discharge.  They considered a Bruton kinase inhibitor, but eventually placed the patient on venetoclax  200 mg daily which he started in early September 2025. WBCs were down to 2.5, hemoglobin down to 8.2, and platelet count down to 89,000 within 2 weeks.   He has a history of COPD, lower GI bleed, CKD requiring dialysis temporarily while in the hospital, CHF, respiratory failure, atrial fibrillation, coronary artery disease, peripheral neuropathy, gout, hypothyroidism, BPH, and bilateral pleural effusions. echocardiogram revealed an ejection fraction of 60-65%. EGD and colonoscopy in July revealed mild reactive gastritis, as well as tubular adenomas in the cecum and sigmoid colon. He was discharged on Lasix , but later changed to Bumex 0.5 mg daily. His cardiologist increased the dose to 1 mg daily. He has atrial fibrillation, for which he is 75 mg Plavix  daily for this.   He  was admitted to the [facility] ICU October 31 to November 6 after being seen in the ED with sinus tachycardia, dyspnea, hypoxemia, and respiratory distress. He was found to have oxygen  saturation of 84% on 3L nasal cannula. Repeat echocardiogram revealed left ventricular EF of 55-60%. Chest X-ray revealed congestion with mild bilateral pleural effusions. He required  BiPAP, IV Lopressor, Lasix , steroids, and antibiotics, including cefepime and doxycycline, to treat for possible pneumonia. He was transferred to [facility] skilled nursing facility for a course of rehab.  ***The Bumex was changed to torsemide 20 mg twice daily.  He is also on nebulized treatments.  He was seen in the hospital by Dr. Mardee and Dr. Monetta and they will follow-up with him.   He has been seen at the [facility] by Dr. Haroldine, hematologist/oncologist, but she did not have the records from [place]. I will be sure to send our information, but it is a hardship for the patient to travel to [place] in his ill, frail, oxygen -dependent state. He is hoping he will be approved for [facility] so that he can have his follow-up and treatment here. He had labs at [facility] on 07/04/2024 which revealed a low WBC of 2.2 with an ANC of 1800 down from 4.3 with an ANC of 2000, a low hemoglobin of 9.2 down from 10.7, and a low platelet count of 111,000, improved from 80,000. *** His CMP revealed a low calcium  of 8.3 down from 8.9, an elevated creatinine of 1.38 up from 1.26, and an elevated BUN of 25.8 up from 21.   At his visit in November, we discussed palliative care with him and he wishes to have DNI (do not intubate) orders.  He inquired about  COVID vaccination, and that was recommended.  Oncology History  CLL (chronic lymphocytic leukemia) (HCC)  03/19/2024 Cancer Staging   Staging form: Chronic Lymphocytic Leukemia / Small Lymphocytic Lymphoma, AJCC 8th Edition - Clinical stage from 03/19/2024: Modified Rai Stage IV (Modified Rai risk: High, Binet: Stage B, Lugano: Stage IV, Lymphocytosis: Present, Adenopathy: Present, Organomegaly: Absent, Anemia: Present, Thrombocytopenia: Present) - Signed by Cornelius Wanda DEL, MD on 06/18/2024 Histopathologic type: B-cell lymphocytic leukemia/small lymphocytic lymphoma (see also [id number]/3) Stage prefix: Initial diagnosis Absolute lymphocyte count (ALC) (cells/uL):  12 Hemoglobin (Hgb) (g/dL): 89.5 Platelet count (x10E9/L of blood): 105 Diagnostic confirmation: Positive histology PLUS positive immunophenotyping and/or positive genetic studies Specimen type: Lymph Node Biopsy Staged by: Managing physician TP53 mutation: Positive Prognostic indicators: Malignant pleural effusion Stage used in treatment planning: Yes National guidelines used in treatment planning: Yes Type of national guideline used in treatment planning: NCCN   05/08/2024 Initial Diagnosis   CLL (chronic lymphocytic leukemia) (HCC)       INTERVAL HISTORY:   Andre Ross is here today for repeat clinical assessment. He denies fevers or chills. He denies pain. His appetite is good. His weight {Weight change:10426}.  REVIEW OF SYSTEMS:   Review of Systems  Constitutional:  Negative for appetite change, chills, fatigue, fever and unexpected weight change.  HENT:   Negative for lump/mass, mouth sores and sore throat.   Respiratory:  Positive for chest tightness and shortness of breath. Negative for cough.   Cardiovascular:  Negative for chest pain and leg swelling.  Gastrointestinal:  Negative for abdominal pain, constipation, diarrhea, nausea and vomiting.  Genitourinary:  Negative for difficulty urinating, dysuria, frequency and hematuria.   Musculoskeletal:  Positive for gait problem (walks with cane). Negative for arthralgias, back pain and myalgias.  Skin:  Positive  for itching. Negative for rash and wound.  Neurological:  Positive for gait problem (walks with cane). Negative for dizziness, extremity weakness, headaches, light-headedness and numbness.  Hematological:  Negative for adenopathy.  Psychiatric/Behavioral:  Negative for depression and sleep disturbance. The patient is not nervous/anxious.      VITALS:   Blood pressure (!) 151/82, pulse 94, temperature 97.7 F (36.5 C), temperature source Oral, resp. rate (!) 24, height 5' 9 (1.753 m), weight 184 lb 8 oz (83.7 kg),  SpO2 99%.  Wt Readings from Last 3 Encounters:  07/30/24 184 lb 8 oz (83.7 kg)  07/05/24 177 lb 14.4 oz (80.7 kg)  06/07/24 189 lb (85.7 kg)    Body mass index is 27.25 kg/m.  Performance status (ECOG): {CHL ONC [id number]    PHYSICAL EXAM:   Physical Exam  LABS:      Latest Ref Rng & Units 07/30/2024   10:36 AM 05/31/2024    3:09 PM 06/02/2022    3:45 PM  CBC  WBC 4.0 - 10.5 K/uL 2.7  4.3  10.5   Hemoglobin 13.0 - 17.0 g/dL 89.3  89.2  87.0   Hematocrit 39.0 - 52.0 % 32.9  33.0  39.6   Platelets 150 - 400 K/uL 52  80  243       Latest Ref Rng & Units 07/30/2024   10:36 AM 05/31/2024    3:09 PM 06/02/2022    3:45 PM  CMP  Glucose 70 - 99 mg/dL 892  91  890   BUN 8 - 23 mg/dL 23  21  27    Creatinine 0.61 - 1.24 mg/dL 8.85  8.73  8.62   Sodium 135 - 145 mmol/L 143  142  144   Potassium 3.5 - 5.1 mmol/L 4.3  4.0  4.0   Chloride 98 - 111 mmol/L 107  102  104   CO2 22 - 32 mmol/L 24  24  24    Calcium  8.9 - 10.3 mg/dL 9.5  8.9  9.7   Total Protein 6.5 - 8.1 g/dL 6.2  6.8    Total Bilirubin 0.0 - 1.2 mg/dL 0.9  0.8    Alkaline Phos 38 - 126 U/L 109  100    AST 15 - 41 U/L 30  40    ALT 0 - 44 U/L 16  21       No results found for: CEA1, CEA / No results found for: CEA1, CEA No components found for: PROSTATESPECIFICANTIGEN PSA1 No results found for: CAN199 No results found for: CAN125  No results found for: TOTALPROTELP, ALBUMINELP, A1GS, A2GS, BETS, BETA2SER, GAMS, MSPIKE, SPEI Lab Results  Component Value Date   TIBC 371 05/31/2024   FERRITIN 345 (H) 05/31/2024   IRONPCTSAT 24 05/31/2024   Lab Results  Component Value Date   LDH 232 (H) 05/31/2024    STUDIES:   No results found.    HISTORY:   Past Medical History:  Diagnosis Date  . (HFpEF) heart failure with preserved ejection fraction (HCC) 03/10/2024  . Actinic keratosis 06/06/2024  . Acute gastrointestinal bleeding 03/28/2018  . Acute non-ST elevation  myocardial infarction (NSTEMI) (HCC) 06/06/2024  . Acute on chronic combined systolic and diastolic CHF (congestive heart failure) (HCC) 03/28/2018  . Acute on chronic respiratory failure with hypoxemia (HCC) 03/10/2024  . Acute respiratory failure (HCC) 07/21/2021  . Adjustment disorder with mixed anxiety and depressed mood 06/06/2024  . Age-related nuclear cataract, bilateral 06/06/2024  . AKI (acute kidney injury) 04/20/2024  .  Alcohol  abuse 03/28/2018  . Allergic rhinitis 06/06/2024  . Anemia 06/06/2024  . Aspiration pneumonia (HCC) 04/20/2024  . B-cell lymphoproliferative disorder (HCC) 03/23/2024  . Benign prostatic hyperplasia 06/06/2024  . Chronic suppurative otitis media 06/06/2024  . CKD (chronic kidney disease) stage 3, GFR 30-59 ml/min (HCC) 03/28/2018  . CLL (chronic lymphocytic leukemia) (HCC) 05/08/2024  . Cobalamin deficiency 08/22/2005  . Congestive heart failure (HCC) 06/06/2024  . COPD with acute exacerbation (HCC) 07/21/2021  . Coronary artery disease involving native coronary artery 12/07/2023  . Degeneration of intervertebral disc of lumbar region 06/06/2024  . Diverticular hemorrhage 03/28/2018  . Diverticulosis 03/10/2024  . Dyspnea, unspecified 06/06/2024  . Dystrophia unguium 06/06/2024  . Elevated troponin 07/21/2021  . Encounter for other preprocedural examination 06/06/2024  . Encounter for surgical aftercare following surgery on the sense organs 06/06/2024  . Enlarged prostate 06/06/2024  . Essential (primary) hypertension 06/06/2024  . Gastroesophageal reflux disease 06/06/2024  . Gout 06/06/2024  . Hallux valgus with bunions 06/06/2024  . Heart failure, unspecified (HCC) 06/06/2024  . History of malignant neoplasm of prostate 06/06/2024  . Hyperkalemia 07/21/2021  . Hyperlipidemia 03/28/2018  . 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 and stage 3 chronic kidney disease (HCC) 03/28/2018  . Hypertensive  urgency 07/21/2021  . Hypothyroidism 06/06/2024  . Iron deficiency anemia 06/06/2024  . Left ventricular systolic dysfunction 04/24/2018   Segmental LAD distribution EF 45-50%  . Localized edema 06/06/2024  . Low back pain 06/06/2024  . Major depression 06/06/2024  . Malaise 06/06/2024  . Male erectile disorder 06/06/2024  . Mood disorder 06/06/2024  . Nightmares associated with chronic post-traumatic stress disorder 06/06/2024  . NSTEMI (non-ST elevated myocardial infarction) (HCC) 05/15/2022  . Onychomycosis due to dermatophyte 03/21/2019  . OSA (obstructive sleep apnea) 12/07/2023  . Osteoarthritis of hip 06/06/2024   Jan 22, 2023 Entered By: VINIE ALLEAN AQUAS Comment: left    . Other hammer toe(s) (acquired), unspecified foot 06/06/2024  . Other idiopathic peripheral autonomic neuropathy 06/06/2024  . Other long term (current) drug therapy 06/06/2024  . Other seborrheic dermatitis 06/06/2024  . Oxygen  dependent 06/06/2024  . Pain and swelling of right ankle 12/07/2023  . Pain in left foot 06/06/2024  . Pain in right foot 06/06/2024   Jul 16, 2023 Entered By: VINIE ALLEAN AQUAS Comment: X-ray-heel spur, hallux valgus, evidence of previous healed fractures    . Paroxysmal atrial fibrillation (HCC) 04/20/2024  . Pes planus 06/06/2024  . Pleural effusion 04/20/2024  . Prediabetes 06/06/2024  . Presence of intraocular lens 06/06/2024  . Primary basal cell carcinoma (BCC) of eyelid of left eye 06/06/2024  . Prostate cancer (HCC)   . PTSD (post-traumatic stress disorder) 03/28/2018  . Sensorineural hearing loss (SNHL) 06/06/2024  . Sepsis due to pneumonia (HCC) 07/21/2021  . Severe combined immunodeficiency disease (HCC) 05/08/2024  . Talipes valgus 06/06/2024  . TIA (transient ischemic attack) 03/28/2018  . Valgus deformity of great toe 06/06/2024    Past Surgical History:  Procedure Laterality Date  . APPENDECTOMY    . CORONARY STENT INTERVENTION N/A 05/16/2022    Procedure: CORONARY STENT INTERVENTION;  Surgeon: Wonda Sharper, MD;  Location: [facility] INVASIVE CV LAB;  Service: Cardiovascular;  Laterality: N/A;  . INGUINAL HERNIA REPAIR    . INSERTION PROSTATE RADIATION SEED    . LEFT HEART CATH AND CORONARY ANGIOGRAPHY N/A 05/16/2022   Procedure: LEFT HEART CATH AND CORONARY ANGIOGRAPHY;  Surgeon: Wonda Sharper, MD;  Location: [facility] INVASIVE CV LAB;  Service: Cardiovascular;  Laterality: N/A;  . STOMACH SURGERY    . TONSILLECTOMY      Family History  Problem Relation Age of Onset  . COPD Father   . Heart disease Father   . Heart attack Father   . Hypertension Maternal Grandmother   . Hypertension Paternal Grandmother     Social History:  reports that he quit smoking about 37 years ago. His smoking use included cigarettes. He started smoking about 67 years ago. He has a 30 pack-year smoking history. He has never used smokeless tobacco. He reports current alcohol  use of about 4.0 - 6.0 standard drinks of alcohol  per week. He reports that he does not currently use drugs.The patient is {Blank single:19197::alone,accompanied by} *** today.  Allergies:  Allergies  Allergen Reactions  . Aspirin  Anaphylaxis and Other (See Comments)    GI upset  Other Reaction(s): Unknown/See Comments  ULCERATED STOMACH  -PT STATES TAKING LOW DOSE CURRENTLY  [facility] 08/14/22  . Hydrocodone Itching and Hives    Current Medications: Current Outpatient Medications  Medication Sig Dispense Refill  . acetaminophen  (TYLENOL ) 500 MG tablet Take 1 tablet by mouth 4 (four) times daily as needed for moderate pain or headache.    . albuterol  (PROVENTIL ) (2.5 MG/3ML) 0.083% nebulizer solution Take 2.5 mg by nebulization every 4 (four) hours as needed for wheezing or shortness of breath.    . albuterol  (VENTOLIN  HFA) 108 (90 Base) MCG/ACT inhaler Inhale 2 puffs into the lungs every 4 (four) hours as needed for wheezing or shortness of breath.    . allopurinol  (ZYLOPRIM ) 300 MG  tablet Take 300 mg by mouth daily.    . amLODipine  (NORVASC ) 2.5 MG tablet Take 1 tablet (2.5 mg total) by mouth daily. 180 tablet 3  . ammonium lactate (LAC-HYDRIN) 12 % lotion Apply 1 Application topically as needed for dry skin.    . atorvastatin  (LIPITOR) 40 MG tablet BEDTIME    . bumetanide (BUMEX) 0.5 MG tablet Take 0.5 mg by mouth daily.    . Cholecalciferol  (VITAMIN D ) 2000 units tablet Take 2,000 Units by mouth daily.    . clopidogrel  (PLAVIX ) 75 MG tablet Take 75 mg by mouth daily.    . Docusate Sodium (DSS) 100 MG CAPS Take 100 mg by mouth daily as needed (constipation).    . ferrous sulfate  325 (65 FE) MG tablet Take 325 mg by mouth 2 (two) times daily after a meal.    . fluticasone (FLONASE) 50 MCG/ACT nasal spray Place 1 spray into both nostrils 2 (two) times daily.    . folic acid  (FOLVITE ) 1 MG tablet Take 1 mg by mouth daily.    . gabapentin  (NEURONTIN ) 300 MG capsule Take 300 mg by mouth at bedtime.    . hydrocortisone cream 1 % Apply 1 Application topically 2 (two) times daily.    SABRA ipratropium-albuterol  (DUONEB) 0.5-2.5 (3) MG/3ML SOLN Q6H as needed for Shortness Of Breath    . ketoconazole (NIZORAL) 2 % shampoo Apply 1 Application topically 2 (two) times a week.    SABRA ketotifen (ZADITOR) 0.025 % ophthalmic solution Place 1 drop into both eyes 2 (two) times daily.    . levothyroxine  (SYNTHROID ) 50 MCG tablet Take 50 mcg by mouth daily before breakfast.    . loratadine  (CLARITIN ) 10 MG tablet Take 10 mg by mouth daily as needed for allergies.    SABRA losartan (COZAAR) 25 MG tablet Take 12.5 mg by mouth daily.    . Multiple Vitamin (MULTIVITAMIN WITH  MINERALS) TABS tablet Take 1 tablet by mouth daily.    . nitroGLYCERIN  (NITROSTAT ) 0.4 MG SL tablet Place 1 tablet (0.4 mg total) under the tongue every 5 (five) minutes x 3 doses as needed for chest pain. 25 tablet 3  . pantoprazole  (PROTONIX ) 40 MG tablet Take 40 mg by mouth daily.    . rosuvastatin (CRESTOR) 10 MG tablet Take 5 mg  by mouth.    . sertraline (ZOLOFT) 50 MG tablet Take 25 mg by mouth.    . tamsulosin  (FLOMAX ) 0.4 MG CAPS capsule Take 0.4 mg by mouth.    . Tiotropium Bromide  2.5 MCG/ACT AERS daily.    . Tiotropium Bromide  Monohydrate (SPIRIVA  RESPIMAT) 1.25 MCG/ACT AERS Inhale 2 Pump into the lungs daily.    . triamcinolone  (KENALOG ) 0.025 % cream Apply 1 Application topically 2 (two) times daily.    . venetoclax  (VENCLEXTA ) 100 MG tablet Take 2 tablets (200 mg total) by mouth daily. 60 tablet 2  . vitamin B-12 (CYANOCOBALAMIN) 500 MCG tablet Take 1,000 mcg by mouth daily.     No current facility-administered medications for this visit.

## 2024-07-30 NOTE — Telephone Encounter (Signed)
 Patient has been scheduled for follow-up visit per 07/30/2024 LOS.  Pt given an appt calendar with date and time.

## 2024-07-30 NOTE — Patient Instructions (Addendum)
 Patient's Ventetoclax was decreased today to 100 mg twice daily.

## 2024-07-31 ENCOUNTER — Encounter: Payer: Self-pay | Admitting: Hematology and Oncology

## 2024-07-31 NOTE — Assessment & Plan Note (Addendum)
 Modified Rai stage IV CLL diagnosed in July 2025.  He received 1 dose of Rituxan and had a severe allergic reaction, so this was discontinued.  He was hospitalized until early September and upon discharge was started on venetoclax  200 mg daily.  LDH was mildly elevated in September.  The lymphadenopathy in his chest is stable to slightly improved and the pleural effusions have nearly resolved on recent CT chest.    His WBCs and platelets have decreased significantly at this time.  I discussed his case with Dr. Cornelius and she recommended decreasing venetoclax  to 100 mg daily.  He is concerned that this medication has given him hives, but no hives are present present.  His skin is dry and it appears that when scratching he is causing petechiae.  I advised him to use a moisturizer and to try to avoid very hot showers or baths.  He reports increased shortness of breath and chest tightness.  Lung examination is normal.  He has significant lower extremity edema, but does not seem clear on his medications.  I asked him to follow-up with his PCP to help with the medication reconciliation.  I asked him to contact Dr. Mardee if increased shortness of breath he notes today persists.  We will plan to see him back in 4 weeks with a CBC, comprehensive metabolic panel and LDH.

## 2024-08-01 ENCOUNTER — Inpatient Hospital Stay

## 2024-08-01 ENCOUNTER — Inpatient Hospital Stay: Admitting: Oncology

## 2024-08-08 ENCOUNTER — Telehealth: Payer: Self-pay

## 2024-08-08 NOTE — Telephone Encounter (Signed)
 I called to speak with pt ands see how he is doing. He states the dry skin, itching and petechia are about the same. He hasn't taken the Venclexta  in 3 days. He states, I know I need to get back on the Venclexta  but I just don't know. He then started conversation about being a [place] vet. I encouraged him to restart the Venclexta , but I'm not sure he will. We confirmed his next appt. He will most likely need transportation. He states he usually tried to hitch a ride with someone or walks. I will send message to scheduling about transportation need. Please advise about lack of adherence taking Venclexta . Message sent to Dr Cornelius Spanner M,PA & Kaitlyn Schomburg,RPH

## 2024-08-09 NOTE — Addendum Note (Signed)
 Addended byBETHA MARTELL GREIG LELON on: 08/09/2024 11:29 AM   Modules accepted: Orders

## 2024-08-12 ENCOUNTER — Ambulatory Visit: Admitting: Cardiology

## 2024-08-28 ENCOUNTER — Inpatient Hospital Stay: Attending: Oncology

## 2024-08-28 ENCOUNTER — Inpatient Hospital Stay: Admitting: Oncology

## 2024-09-02 ENCOUNTER — Telehealth: Payer: Self-pay | Admitting: Oncology

## 2024-09-02 NOTE — Telephone Encounter (Signed)
 Contacted pt to schedule an appt. Unable to reach via phone, voicemail was left.

## 2024-09-02 NOTE — Telephone Encounter (Signed)
-----   Message from Wanda Cornish, MD sent at 08/28/2024  9:07 AM EST ----- Regarding: appt Was in hospital Sat night, thank you for printing ER note.  Transferred to [facility] for resp failure, CHF,COPD So today's appt cancelled - will need to reschedule f/u when he is discharged

## 2024-09-07 NOTE — Progress Notes (Unsigned)
 " Cardiology Office Note:    Date:  09/07/2024   ID:  Andre Ross, DOB [birth year], MRN [id number]  PCP:  Andre Vicenta BRAVO, MD  Cardiologist:  Andre Leiter, MD    Referring MD: Andre Vicenta BRAVO, MD    ASSESSMENT:    1. Chronic heart failure with preserved ejection fraction (HCC)   2. Coronary artery disease involving native heart with other form of angina pectoris, unspecified vessel or lesion type   3. Chronic obstructive pulmonary disease, unspecified COPD type (HCC)   4. Mixed hyperlipidemia   5. 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 and stage 3b chronic kidney disease (HCC)    PLAN:    In order of problems listed above:  ***   Next appointment: ***   Medication Adjustments/Labs and Tests Ordered: Current medicines are reviewed at length with the patient today.  Concerns regarding medicines are outlined above.  No orders of the defined types were placed in this encounter.  No orders of the defined types were placed in this encounter.    History of Present Illness:    Andre Ross is a 79 y.o. male with a hx of heart failure mildly reduced ejection fraction hypertensive heart and kidney disease with stage III CKD alcohol  abuse receiving care at the [facility] last seen by me September 2019.  He was admitted to [facility] hospital earlier this month with a diagnosis of respiratory failure but the hospital notes talk about generalized muscle weakness and physical therapy.  He has a history of CLL with chronic thrombocytopenia CAD with remote PCI in 2023 drug-eluting stent to the proximal LAD stroke and chronic pleural effusions.  CT of the chest showed mediastinal adenopathy moderate bilateral pleural effusions and atelectasis emphysema splenomegaly and hepatic steatosis there is notation he received intravenous diuretics magnesium  narcotics nebulizers and IV fluids at an outside hospital prior to transfer.  He had thoracentesis  performed.  Fluid grew Staphylococcus which appeared to be a contaminant on 1 of 4 cultures.  The summary described a  little volume overload given a few days of IV Bumex but it appears as if the predominant problem was underlying lung disease and pleural effusions.  In summary it did not appear to be predominant cardiac admission.  He did have an echocardiogram performed showing normal size wall thickness EF 55 to 60% diastolic dysfunction and elevated left atrial pressure normal right ventricular size and function severe left atrial enlargement normal IVC mild to moderate mitral regurgitation mild tricuspid regurgitation with moderate to severe elevation pulmonary artery systolic pressure.  Prior to discharge hemoglobin 7.7 platelet count 55,000 creatinine 2.0 GFR 33 cc/min stage IIIb CKD potassium 4.1 both protein and albumin  were diminished N-terminal proBNP level was severely elevated 2111 on admission 3068.  Compliance with diet, lifestyle and medications: *** Past Medical History:  Diagnosis Date   (HFpEF) heart failure with preserved ejection fraction (HCC) 03/10/2024   Actinic keratosis 06/06/2024   Acute gastrointestinal bleeding 03/28/2018   Acute non-ST elevation myocardial infarction (NSTEMI) (HCC) 06/06/2024   Acute on chronic combined systolic and diastolic CHF (congestive heart failure) (HCC) 03/28/2018   Acute on chronic respiratory failure with hypoxemia (HCC) 03/10/2024   Acute respiratory failure (HCC) 07/21/2021   Adjustment disorder with mixed anxiety and depressed mood 06/06/2024   Age-related nuclear cataract, bilateral 06/06/2024   AKI (acute kidney injury) 04/20/2024   Alcohol  abuse 03/28/2018   Allergic rhinitis 06/06/2024   Anemia 06/06/2024  Aspiration pneumonia (HCC) 04/20/2024   B-cell lymphoproliferative disorder (HCC) 03/23/2024   Benign prostatic hyperplasia 06/06/2024   Chronic suppurative otitis media 06/06/2024   CKD (chronic kidney disease) stage 3,  GFR 30-59 ml/min (HCC) 03/28/2018   CLL (chronic lymphocytic leukemia) (HCC) 05/08/2024   Cobalamin deficiency 08/22/2005   Congestive heart failure (HCC) 06/06/2024   COPD with acute exacerbation (HCC) 07/21/2021   Coronary artery disease involving native coronary artery 12/07/2023   Degeneration of intervertebral disc of lumbar region 06/06/2024   Diverticular hemorrhage 03/28/2018   Diverticulosis 03/10/2024   Dyspnea, unspecified 06/06/2024   Dystrophia unguium 06/06/2024   Elevated troponin 07/21/2021   Encounter for other preprocedural examination 06/06/2024   Encounter for surgical aftercare following surgery on the sense organs 06/06/2024   Enlarged prostate 06/06/2024   Essential (primary) hypertension 06/06/2024   Gastroesophageal reflux disease 06/06/2024   Gout 06/06/2024   Hallux valgus with bunions 06/06/2024   Heart failure, unspecified (HCC) 06/06/2024   History of malignant neoplasm of prostate 06/06/2024   Hyperkalemia 07/21/2021   Hyperlipidemia 03/28/2018   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 and stage 3 chronic kidney disease (HCC) 03/28/2018   Hypertensive urgency 07/21/2021   Hypothyroidism 06/06/2024   Iron deficiency anemia 06/06/2024   Left ventricular systolic dysfunction 04/24/2018   Segmental LAD distribution EF 45-50%   Localized edema 06/06/2024   Low back pain 06/06/2024   Major depression 06/06/2024   Malaise 06/06/2024   Male erectile disorder 06/06/2024   Mood disorder 06/06/2024   Nightmares associated with chronic post-traumatic stress disorder 06/06/2024   NSTEMI (non-ST elevated myocardial infarction) (HCC) 05/15/2022   Onychomycosis due to dermatophyte 03/21/2019   OSA (obstructive sleep apnea) 12/07/2023   Osteoarthritis of hip 06/06/2024   Jan 22, 2023 Entered By: Andre Ross Comment: left     Other hammer toe(s) (acquired), unspecified foot 06/06/2024   Other idiopathic  peripheral autonomic neuropathy 06/06/2024   Other long term (current) drug therapy 06/06/2024   Other seborrheic dermatitis 06/06/2024   Oxygen  dependent 06/06/2024   Pain and swelling of right ankle 12/07/2023   Pain in left foot 06/06/2024   Pain in right foot 06/06/2024   Jul 16, 2023 Entered By: Andre Ross Comment: X-ray-heel spur, hallux valgus, evidence of previous healed fractures     Paroxysmal atrial fibrillation (HCC) 04/20/2024   Pes planus 06/06/2024   Pleural effusion 04/20/2024   Prediabetes 06/06/2024   Presence of intraocular lens 06/06/2024   Primary basal cell carcinoma (BCC) of eyelid of left eye 06/06/2024   Prostate cancer (HCC)    PTSD (post-traumatic stress disorder) 03/28/2018   Sensorineural hearing loss (SNHL) 06/06/2024   Sepsis due to pneumonia (HCC) 07/21/2021   Severe combined immunodeficiency disease (HCC) 05/08/2024   Talipes valgus 06/06/2024   TIA (transient ischemic attack) 03/28/2018   Valgus deformity of great toe 06/06/2024    Current Medications: Active Medications[1]    EKGs/Labs/Other Studies Reviewed:    The following studies were reviewed today:  Cardiac Studies & Procedures   ______________________________________________________________________________________________ CARDIAC CATHETERIZATION  CARDIAC CATHETERIZATION 05/16/2022  Conclusion 1.  Severe proximal LAD stenosis treated with a 3.5 x 16 mm Synergy DES, postdilated to high-pressure with a 3.75 mm Advance balloon 2.  Mild to moderate nonobstructive proximal left circumflex stenosis 3.  Widely patent, large dominant RCA with no stenosis 4.  Low LVEDP  Recommendations: Continue dual antiplatelet therapy with aspirin  and clopidogrel  at least 12 months without interruption (ACS class I  guideline).  Aggressive risk reduction measures.  As long as no complications arise, patient could be discharged from the hospital tomorrow.  Findings Coronary Findings Diagnostic   Dominance: Right  Left Main The left main is patent with no stenosis.  The vessel divides into the LAD and left circumflex.  Left Anterior Descending Prox LAD lesion is 80% stenosed. The lesion is eccentric. The lesion is calcified. The proximal LAD has an 80% eccentric stenosis  Left Circumflex Ost Cx to Prox Cx lesion is 40% stenosed. The circumflex is a large-caliber vessel that supplies 2 obtuse marginal branches and has mild to moderate proximal stenosis that does not appear to be flow obstructive.  Right Coronary Artery Vessel was injected. Vessel is large. The vessel exhibits minimal luminal irregularities. Large, dominant vessel with no significant obstructive disease.  There are minimal irregularities noted.  Intervention  Prox LAD lesion Stent CATH LAUNCHER 6FR EBU3.5 guide catheter was inserted. Lesion crossed with guidewire using a WIRE COUGAR XT STRL 190CM. Pre-stent angioplasty was performed using a BALLN SAPPHIRE 2.5X12. A drug-eluting stent was successfully placed using a SYNERGY XD 3.50X16. Maximum pressure: 16 atm. Post-stent angioplasty was performed using a BALL SAPPHIRE NC24 3.75X12. Maximum pressure:  20 atm. Post-Intervention Lesion Assessment The intervention was successful. Pre-interventional TIMI flow is 3. Post-intervention TIMI flow is 3. No complications occurred at this lesion. There is a 0% residual stenosis post intervention.   STRESS TESTS  MYOCARDIAL PERFUSION IMAGING 12/14/2017   ECHOCARDIOGRAM  ECHOCARDIOGRAM COMPLETE 07/22/2021  Narrative ECHOCARDIOGRAM REPORT    Patient Name:   Tristyn ACELIN FERDIG Date of Exam: 07/22/2021 Medical Rec #:  [id number]      Height:       69.0 in Accession #:    [id number]     Weight:       179.9 lb Date of Birth:  1945/10/21      BSA:          1.975 m Patient Age:    75 years       BP:           148/84 mmHg Patient Gender: M              HR:           88 bpm. Exam Location:  Inpatient  Procedure: 2D  Echo  Indications:    CHF- Acute Systolic I50.21  History:        Patient has prior history of Echocardiogram examinations, most recent 03/19/2018. Risk Factors:Dyslipidemia and Alcohol  Abuse.  Sonographer:    Augustin Seals RDCS Referring Phys: [id number] RONDELL A SMITH  IMPRESSIONS   1. Left ventricular ejection fraction, by estimation, is 70 to 75%. The left ventricle has hyperdynamic function. The left ventricle has no regional wall motion abnormalities. Left ventricular diastolic parameters are consistent with Grade I diastolic dysfunction (impaired relaxation). 2. Right ventricular systolic function is normal. The right ventricular size is normal. 3. The mitral valve is normal in structure. No evidence of mitral valve regurgitation. No evidence of mitral stenosis. 4. The aortic valve has an indeterminant number of cusps. Aortic valve regurgitation is not visualized. No aortic stenosis is present. 5. The inferior vena cava is normal in size with greater than 50% respiratory variability, suggesting right atrial pressure of 3 mmHg.  FINDINGS Left Ventricle: Left ventricular ejection fraction, by estimation, is 70 to 75%. The left ventricle has hyperdynamic function. The left ventricle has no regional wall motion abnormalities. The left ventricular internal cavity size  was normal in size. There is no left ventricular hypertrophy. Left ventricular diastolic parameters are consistent with Grade I diastolic dysfunction (impaired relaxation).  Right Ventricle: The right ventricular size is normal. Right ventricular systolic function is normal.  Left Atrium: Left atrial size was normal in size.  Right Atrium: Right atrial size was normal in size.  Pericardium: Trivial pericardial effusion is present.  Mitral Valve: The mitral valve is normal in structure. No evidence of mitral valve regurgitation. No evidence of mitral valve stenosis.  Tricuspid Valve: The tricuspid valve is normal in  structure. Tricuspid valve regurgitation is trivial. No evidence of tricuspid stenosis.  Aortic Valve: The aortic valve has an indeterminant number of cusps. Aortic valve regurgitation is not visualized. No aortic stenosis is present.  Pulmonic Valve: The pulmonic valve was not well visualized. Pulmonic valve regurgitation is not visualized. No evidence of pulmonic stenosis.  Aorta: The aortic root is normal in size and structure.  Venous: The inferior vena cava is normal in size with greater than 50% respiratory variability, suggesting right atrial pressure of 3 mmHg.  IAS/Shunts: No atrial level shunt detected by color flow Doppler.   LEFT VENTRICLE PLAX 2D LVIDd:         4.70 cm   Diastology LVIDs:         3.00 cm   LV e' medial:    3.73 cm/s LV PW:         1.00 cm   LV E/e' medial:  19.6 LV IVS:        1.00 cm   LV e' lateral:   4.38 cm/s LVOT diam:     2.00 cm   LV E/e' lateral: 16.7 LV SV:         71 LV SV Index:   36 LVOT Area:     3.14 cm   RIGHT VENTRICLE RV S prime:     18.10 cm/s TAPSE (M-mode): 1.8 cm  LEFT ATRIUM             Index        RIGHT ATRIUM           Index LA diam:        3.10 cm 1.57 cm/m   RA Area:     16.50 cm LA Vol (A2C):   53.5 ml 27.09 ml/m  RA Volume:   38.70 ml  19.59 ml/m LA Vol (A4C):   45.2 ml 22.89 ml/m LA Biplane Vol: 48.8 ml 24.71 ml/m AORTIC VALVE LVOT Vmax:   119.00 cm/s LVOT Vmean:  83.000 cm/s LVOT VTI:    0.226 m  AORTA Ao Root diam: 2.70 cm Ao Asc diam:  2.90 cm  MITRAL VALVE MV Area (PHT): 3.39 cm     SHUNTS MV Decel Time: 224 msec     Systemic VTI:  0.23 m MV E velocity: 73.10 cm/s   Systemic Diam: 2.00 cm MV A velocity: 108.00 cm/s MV E/A ratio:  0.68  Andre Shallow MD Electronically signed by Andre Shallow MD Signature Date/Time: 07/22/2021/12:17:15 PM    Final          ______________________________________________________________________________________________          Recent  Labs: 07/30/2024: ALT 16; BUN 23; Creatinine 1.14; Hemoglobin 10.6; Platelet Count 52; Potassium 4.3; Sodium 143  Recent Lipid Panel    Component Value Date/Time   CHOL 164 05/16/2022 0217   TRIG 78 05/16/2022 0217   HDL 52 05/16/2022 0217   CHOLHDL 3.2 05/16/2022 0217  VLDL 16 05/16/2022 0217   LDLCALC 96 05/16/2022 0217    Physical Exam:    VS:  There were no vitals taken for this visit.    Wt Readings from Last 3 Encounters:  07/30/24 184 lb 8 oz (83.7 kg)  07/05/24 177 lb 14.4 oz (80.7 kg)  06/07/24 189 lb (85.7 kg)     GEN: *** Well nourished, well developed in no acute distress HEENT: Normal NECK: No JVD; No carotid bruits LYMPHATICS: No lymphadenopathy CARDIAC: ***RRR, no murmurs, rubs, gallops RESPIRATORY:  Clear to auscultation without rales, wheezing or rhonchi  ABDOMEN: Soft, non-tender, non-distended MUSCULOSKELETAL:  No edema; No deformity  SKIN: Warm and dry NEUROLOGIC:  Alert and oriented x 3 PSYCHIATRIC:  Normal affect    Signed, Andre Leiter, MD  09/07/2024 10:17 AM    [facility]      [1]  No outpatient medications have been marked as taking for the 09/09/24 encounter (Appointment) with Ross Andre PARAS, MD.   "

## 2024-09-08 NOTE — Progress Notes (Signed)
 Length of Stay (days): 16  CC/HPI per Admitting provider CC: dyspnea   History of Present Illness:  Andre Ross. is a 79 y.o. male with past med hx of CLL on venetoclax , chronic thrombocytopenia, HTN, CKD3a, COPD on 2L, CAD w/ remote PCI, hx CVA, HFpEF with chronic pleural effusions who presented with cc of LUQ abd pain, dyspnea.    [facility] states that he has been doing okay since last hospital discharge on venetoclax .  However over the last few weeks developed worsening dyspnea, left upper quadrant discomfort.  Patient states he has had left upper quadrant discomfort for weeks intermittently, but feels it has gotten slightly worse.  No significant nausea/vomiting.  No bleeding per rectum or melena.  Has had 1 episode of epistaxis last night.  Increasing swelling, weights per patient over past weeks.  Does feel improved after dosing of Bumex at outside hospital with good UOP subjectively.  Patient also endorsing increasing chest tightness and change to sputum production becoming more yellowish.  Has been using more breathing treatments at home.  No fevers or chills.  No new rashes.  No headaches or vision changes.  Last dose Plavix  1/1.   At outside hospital, work up notable for: EKG NSR w/o ST changes notable for acute ischemia. Afebrile, tachy 90-100s, BP largely hypertensive, requiring 2-8L HFNC [place] at OSH to maintain saturations.  Plt 57. WBC 9.2 Hgb 11.6. Mg 1.3 Lytes otherwise normal. Cr 1.1. INR 1.1. LA 1.0. BNP 3320. Trop 0.19 stable. RVP normal. UA notable for occult blood. T bili 1.3.  CT CAP notable for enlarged lymph nodes in b/l supraclavicular / axillary / mediastinal / retroperitoneal / periportal regions largest measuring 22mm in short axis. Moderate b/l pleural effusions w/ assoc atelectasis, diffuse emphysematous changes, moderate splenomegaly and mild hepatomegaly w/ fatty infiltration; mult gallbladder calculi, large hiatal hernia, anterior abd wall  umbilical hernia, enlarged prostate, enlarged mediastinal lymph nodes, no dilation of CBD.  Pt given IV bumex, mag, morphine, nebs and IV bolus of fluids at outside ED.   Subjective/Events Overnight: Seen and examined. No complaints Denies undue shortness of breath.  O2 needs down to 3 L  Objective   Scheduled Meds:  acalabrutinib maleate  100 mg Oral BID   allopurinol   300 mg Oral q AM   [Held by Provider] bumetanide  0.5 mg Oral BID 6a-2p   cetirizine  10 mg Oral Daily   [Held by Provider] clopidogrel  bisulfate  75 mg Oral Daily   dextromethorphan-guaiFENesin  1 tablet Oral Q12H SCH   [Held by Provider] enoxaparin   40 mg Subcutaneous Q24H   ferrous sulfate   325 mg Oral BRK   folic acid   1 mg Oral q AM   gabapentin   300 mg Oral HS   ipratropium-albuterol   3 mL Nebulization QID   levothyroxine  sodium  50 mcg Oral Daily   montelukast  10 mg Oral HS   pantoprazole  sodium  40 mg Oral q AM   piperacillin-tazabactam  4.5 g IntraVENous Q8H 02-02-21   polyethylene glycol  17 g Oral Daily   rosuvastatin calcium   5 mg Oral Daily   sennosides-docusate sodium  2 tablet Oral BID   sertraline  25 mg Oral Daily   tamsulosin   0.4 mg Oral Daily   [Held by Provider] umeclidinium bromide  1 puff Inhalation Daily RSP   vitamin B-12  1,000 mcg Oral Daily   Continuous Infusions:  NaCl     PRN Meds:.albuterol  sulfate, dextromethorphan-guaiFENesin,  docusate sodium, fluticasone propionate, hydrALAZINE  HCl, HYDROmorphone , hydrOXYzine HCl, NaCl, ondansetron  **OR** ondansetron , polyethylene glycol, traMADol DIET: Cardiac diet 2 gm NA; 1800 ML FLUID (500 ML PER TRAY) Dietary nutrition supplements Ensure Plus High Protein; Strawberry, Vanilla Dietary nutrition supplements Magic Cup; Mixed Berry Percent Meals Eaten (%):  (still eating) Vital signs in last 24 hours: Intake/Output:  Temp:  [97.5 F (36.4 C)-99 F (37.2 C)] 98.7 F (37.1 C) Heart Rate:  [78-95] 79 Resp:  [16-18]  18 BP: (100-126)/(53-61) 107/56 SpO2:  [90 %-99 %] 92 %  O2 Device: Nasal cannula   Wt Readings from Last 1 Encounters:  09/08/24 78.4 kg (172 lb 13.5 oz)    Weight change:   Intake/Output Summary (Last 24 hours) at 09/08/2024 0931 Last data filed at 09/08/2024 0738 Gross per 24 hour  Intake 840 ml  Output 1050 ml  Net -210 ml   I/O last 3 completed shifts: In: 1440 [P.O.:1440] Out: 2050 [Urine:2050] I/O this shift: In: -  Out: 225 [Urine:225]       Physical Exam   Constitutional - resting comfortably, no acute distress Eyes -  extraocular movements intact Nose - no gross deformity or drainage Mouth - no oral lesions noted Neck - supple, no JVD   CV - (+)S1S2, no murmurs  Resp - CTA bilaterally, no wheezing or crackles . Good BS bilaterally GI - (+)BS, soft, non-tender, non-distended Extrem - no  peripheral edema  Skin - no rashes or wounds Neuro - alert, aware, oriented to person/place.  Mild slowing of thought processing with poor concentration.  No gross focal deficit. Psych - normal affect, no anxiety     Recent Labs    Units 09/08/24 0124 09/07/24 0339 09/06/24 0329  WBC 10e3cells/uL 129.3* 135.1* 121.7*  HGB gm/dL 7.6* 7.7* 7.6*  HCT % 74.4* 25.5* 25.1*  PLT 10e3cells/uL 54* 55* 59*   Recent Labs    Units 09/08/24 0124 09/07/24 0339 09/06/24 0329  NA mmol/L 140 141 142  K mmol/L 4.4 4.1 3.7  CL mmol/L 105 105 106  CO2 mmol/L 23 25 27   BUN mg/dL 30* 33* 30*  CREATININE mg/dL 7.89* 7.98* 7.99*  CALCIUM  mg/dL 8.5* 8.5* 8.4*   Recent Labs    Units 09/04/24 0426 09/03/24 0529 09/02/24 0455  MAGNESIUM  mg/dL 2.1 2.1 1.9   No results for input(s): TSH, T4, HGBA1C in the last 168 hours. Recent Labs    Units 09/08/24 0124 09/07/24 0339 09/06/24 0329 09/05/24 0507 09/04/24 0426  BILITOT mg/dL 0.5 0.4 0.4 0.4 0.5  AST IU/L 15 15 21  40 50*  ALT IU/L 15 22 25  37 41  ALKPHOS IU/L 105 115 126 154 168*  ALBUMIN  gm/dL 2.8* 2.9* 2.8* 2.8*  3.3*  LDH IU/L 449* 482* 521*  --  567*  URICACID mg/dL  --  5.3 5.0 4.4 4.6   No results for input(s): LABPROT, INR, PTT in the last 168 hours. No results for input(s): CHOL, LDL, HDL, TRIG in the last 168 hours. Recent Labs    Units 09/08/24 0124 09/07/24 0339 09/06/24 0329 09/05/24 0507 09/04/24 0426 09/03/24 0529  GLUCOSE mg/dL 92 898* 90 89 98 86   No results for input(s): TROPONIN, CK in the last 168 hours.  Invalid input(s): CK-MB Recent Labs    Units 09/04/24 0426 09/02/24 0455  BNP pg/mL 2,111* 1,335   No results found for this or any previous visit (from the past 72 hours).  Lab Results  Component Value Date/Time   Blood Culture  No growth at 5 days 08/26/2024 03:41 PM   Blood Culture No growth at 5 days 08/26/2024 03:39 PM   Culture No growth at 2 days 09/05/2024 02:55 PM   Culture No growth at 5 days 09/01/2024 10:07 AM      Additional labs:   Pertinent Radiological findings :  No results found.   Telemetry: NSR VR 84 BPM    ASSESSMENT AND PLAN:  Acute on chronic hypoxic respiratory failure   Recurrent malignant pleural effusions Atelectasis, PNA.  Left pleural effusion with Staph hemolyticus  1/4 (negative on subsequent thoras) OSA non adherence with CPAP AECOPD (resolved),  pHTN,  mild AoC HFpEF by 1/7(resolved) 1/12 synopsis per prior provider  - On 2-3L chronically.  - Respiratory failure is primarily due to recurrent malignant pleural effusions with associated atelectasis. Initially did not appear to have PNA. Initial Procal 0.3, WBC 10.6.  TTE with preserved LVEF. 1/5 had a fever to 100.9. 1/4 left thora with 500cc out, and cx resulted Staph hemolyticus. Right thora 1/6 with 2L out and cx remained negative. Tx with Rocephin . O2 requirements fluctuated and by 1/7 up from 3L to 10L which was attributed to worsening effusions and a little volume overload/ HFpEF exacerbation (Bumex held x2 days at that time). Bumex IV  restarted and improved to 6L [place] in one day. By 1/9 again worsening respiratory status with O2 requirements back up to 10L. CTPA obtained and showed a large left pleural effusions, moderate right. Total lingular and LLL atelectasis/consolidation, subtotal LLL atelectasis/consolidation. Abx escalated to Zosyn. More aggressive pulmonary toilet implemented with Volara, VEST along with hand held devices. WBC continued to increase associated with CLL.  --1/10 At this point the malignant aspect of his effusions is driving the worsening RF. Checked in with malignant heme and they are considering switching pt to acalabrutinib. With better CLL control, pleural effusions may respond better as well --1/11 repeat left thoracentesis with 1200cc removed. HFNC 11L to 5L [place] post thora PLAN:  --On 5L [place], wean as able.  --Follow thora studies, cx. Plavix  held. May need to repeat right side soon but monitor for now. CXR in AM. If pleural effusions stay down and O2 requirements closer to 3L then can work on discharging him. Unfortunately so far effusions have re accumulated quickly with escalation to HFNC. Pleurex cath would be last resort  --Cont Zosyn x5d, started 1/9 --more aggressive pulmonary toilet.  Volara/VEST QID. Continue the hand held IS and OPEP - complete prednisone  taper. Cont Duonebs QID, Mucinex, Singulair, Zyrtec --Bumex held d/t renal fucntion.  Strict I/Os, daily wts --not adherent to CPAP  1/13 He is stable on 5 L.  Sats are 99%.  Wean O2 as tolerated.  Continue DuoNebs 4 times daily, Mucinex DM, Singulair, Zyrtec, Appears euvolemic with poor oral intake. we will continue to hold Bumex. Follow-up on repeat chest x-ray. If pleural fluid is reaccumulating would need to speak with oncology about their thoughts regarding possible PleurX drain. 1/14 O2 needs  stable on 5 L.  Unable to wean. Usual baseline apparently 2 liters. Sats are 99%.  Wean O2 as tolerated.  Continue DuoNebs 4 times daily, Mucinex  DM, Singulair, Zyrtec,Zosyn started 1/9  repeat chest x-ray - minimal fluid  I ddi  call and talk with malignant Heme (NP) . Would not be considering a PleurX at this stage for now, unless absolutely necessary due to very frequent need for thora  and if GOC are comfort Advises monitor and monitor response to  chemo change and treat vol overload as  well if present Will continue  to monitor Repeat CT today to further evaluate  effusions/atelectasis Encourage mobilization as able Resume low dose Bumex and monitor renal function 1/15 CT chest 1/14  shows persistent moderate left greater the right bilateral pleural effusions with adjacent consolidative volume loss. There is moderate centrilobular and paraseptal emphysema. The degree of consolidative volume loss in the bilateral lower lobes precludes evaluation for mass in these locations.  O2 need higher Reiewed thoras and micro  results -Left thora 1/4- staph hemolyticus isolated 1/10 (fluid ph was normal) -Right thora 1/5 no growth- no growth -Left thora 1/11 - Pt had been on rocephin  , then switched to Zosyn 1/9 due to expire soon. Extend Zosyn for now. Will order repeat diagnostic and therapeutic thoracentesis today. Hold lovenox  today No signs of fluid overload on CT. Do not feel diuretics will have a great role to play in managing thee malignant effusions Continue to monitor and FU on all cx. 1/16 Afebrile SP L thora 1/16 with 1380 mls taken off pH normal. gram stain negative. No growth Breathing much better. O2 needs down to 5 liters Continue to monitor FU on culture/cytology Continue to monitor Wean O2 as tolerated 1/17 Afebrile.  Breathing stable.  O2 needs stable at 5 L.  Cultures negative to date cytology negative to date.  Continue to monitor.  Wean O2 as tolerated. 1/18 Better O2 needs down to 3 L. Cx remain negative. Clinically improved Continue    LUQ abdominal pain-  This was suspected to be secondary to splenomegaly  caused by CLL and well as pleural effusion/atelectasis  - Had significant stool burden- treated - No CBD dilation, LFTs unremarkable no biliary etiology suspected. PLAN - Consulted palliative care 1/5 for pain control - has Dilaudid  1mg  po PRN - cont bowel regimen  1/13 No pain complaints. 1/14 to 1/17 No pain 1/18 states occasional upper left flank discomfort. None now.   CLL; leukocytosis WCC has dramatically increased from near normal on admission.  1/13 note malignant hematology  team is following - venetoclax  held initially. Resumed 1/6. WBC to 50s>46 >60>76 >72 > 68.>73 --appreciate heme onc following. Considering switching therapies since not responding well. If responds better to tx may improve pleural effusions Will follow-up on malignant heme recommendations. 1/14 WCC up to 83K. I note Malignant heme is changing chemo regimen. 1/15 WCC 95 K Malignant hem following 1/16 WCC 121 K FU on Malignant heme team recs 1/17 WCC 135 K .  Remains on acalabrutinib. Will follow-up on  malignant heme recommendations.  Continue to monitor 1/18  WCC 129 K continue per malignant heme team     AKI - 1/12 per prior provider note not present on admission. Creatinine on admission was 1.1, rising to 1.6 on 1/4. He had a CT with contrast prior to arrival here and suspect that is the etiology.  1/5 creatinine down to 1.4.> 1/7 Cr to 1.7>1.6 >1.4 >1.68 >1.6 1/11 >1.7 today --Bumex held 1/11. Also had a CTPA on 1/10.  Monitor. BMP in AM   1/13 to 1/14 Creatinine creeping up.  1.83 --> 1.8.  Continue to monitor.  Resuming low dose bumex PO 1/15 Stable cr monitor 1/16 Cr 2 Hold bumex Monitor 1/17 to 1/18 Creatinine stable around 2 at present.  Continue to hold Bumex.  Continue to monitor creatinine.   History of GIB: Noted. Occurred in mid-July 2025.  No active issues at present   CAD-s/p non-STEMI  in 2023 with PCI/DES to pLAD x 1  Paroxysmal atrial fibrillation Prior provider notes on  1/12 Last stent was placed several years ago.  Atrial fibrillation was a new diagnosis during July, present on admission, converted to NSR without recurrence. Not on anticoagulation due to obvious bleeding risks.  Also not on rate control medications. Troponin was slightly elevated, peaked at 140, dropping to 134.  Suspect this is demand not ACS. No chest pain, anticoagulation would be difficult regardless.  --Anaphylaxis listed to ASA. Per last cardiology note Oct 2025 pt should cont Plavix  for CAD PLAN - monitor heart rate; BPs soft - continue rosuvastatin, Plavix  (held for thoras) 1/13 to 1/18 Stable heart rate.  Normal sinus rhythm.  Continue to monitor  Hypomagnesemia- Has been replaced.  Continue to monitor     Gout: Continue allopurinol  300 mg  Neuropathy: Continue gabapentin  300 mg at bedtime  Hypothyroidism: Continue levothyroxine  50 mcg  GERD: No active issues.  Continue pantoprazole   BPH: No active issues.  Continue tamsulosin . Note remote history of prostate cancer.   GAD/PTSD/MDD: No active issues.  Continue sertraline, gapabentin. Noted history alcohol  abuse (at least as recent as 2019).   OSA: CPAP.    - Anemia and thrombocytopenia:  POA. . Hgb 10.7.  Here TSAT 5%, iron 14, ferritin 382. Continue oral iron, reduced from twice daily to once daily. History iron deficiency anemia. Folic acid .  Hematology ordered IV iron.  - Thrombocytopenia: maybe secondary to venetolclax, possibly liver dysfunction.  1/13 Hemoglobin 8.  Platelets 66K.  Follow-up on further recommendations from hematology 1/15 Hgb/Plt---7.8 /60  1/17 Hgb/plt--- 7.7/55.  Continue to monitor.  Follow-up on further recommendations from malignant heme team.  Mobility impairment 2/2 underlying medical conditions- PT/OT. He prefers to dc to SNF for STR when medically stable  Further management will be determined as the patient's condition evolves.   General  Ethics: No CPR   PCP: Josette Irving, MD  [phone]         DVT prophylaxis Subcu Lovenox   Disposition Pending medical stability. Plan would be to DC to SNF.   Discussed plan of care with patient.  Discussed with RN.        Andre MALVA Laughter, MD 09/08/2024 9:31 AM

## 2024-09-09 ENCOUNTER — Ambulatory Visit: Admitting: Cardiology

## 2024-09-09 DIAGNOSIS — I130 Hypertensive heart and chronic kidney disease with heart failure and stage 1 through stage 4 chronic kidney disease, or unspecified chronic kidney disease: Secondary | ICD-10-CM

## 2024-09-09 DIAGNOSIS — I5032 Chronic diastolic (congestive) heart failure: Secondary | ICD-10-CM

## 2024-09-09 DIAGNOSIS — E782 Mixed hyperlipidemia: Secondary | ICD-10-CM

## 2024-09-09 DIAGNOSIS — J449 Chronic obstructive pulmonary disease, unspecified: Secondary | ICD-10-CM

## 2024-09-09 DIAGNOSIS — I25118 Atherosclerotic heart disease of native coronary artery with other forms of angina pectoris: Secondary | ICD-10-CM

## 2024-09-24 ENCOUNTER — Telehealth: Payer: Self-pay

## 2024-09-24 ENCOUNTER — Other Ambulatory Visit: Payer: Self-pay | Admitting: Oncology

## 2024-09-24 DIAGNOSIS — C9110 Chronic lymphocytic leukemia of B-cell type not having achieved remission: Secondary | ICD-10-CM

## 2024-09-24 NOTE — Telephone Encounter (Signed)
 Sherry,LPN, with [facility] LVM on nurse line. Pt was admitted there from [facility]. While he was inpatient the oncologist there changed his medication. He has been started on Acalabrutinib  100mg  po BID. They sent him with a 10 day supply from the hospital pharmacy. She wants to know if you need to see him in the clinic asap to review and prescribe more, or can you just send in prescription for family to bring in to them? Message sent to Dr Cornelius, [facility], Kaitlyn Schomburg,RPH, & Angela Evans,LPN.

## 2024-09-25 ENCOUNTER — Telehealth: Payer: Self-pay

## 2024-09-25 ENCOUNTER — Other Ambulatory Visit: Payer: Self-pay

## 2024-09-25 ENCOUNTER — Other Ambulatory Visit: Payer: Self-pay | Admitting: Hematology and Oncology

## 2024-09-25 ENCOUNTER — Other Ambulatory Visit (HOSPITAL_COMMUNITY): Payer: Self-pay

## 2024-09-25 DIAGNOSIS — C9110 Chronic lymphocytic leukemia of B-cell type not having achieved remission: Secondary | ICD-10-CM

## 2024-09-25 MED ORDER — CALQUENCE 100 MG PO TABS
100.0000 mg | ORAL_TABLET | Freq: Two times a day (BID) | ORAL | 0 refills | Status: AC
  Filled 2024-09-25: qty 60, 30d supply, fill #0

## 2024-09-25 MED ORDER — ACALABRUTINIB 100 MG PO CAPS: 100.0000 mg | ORAL_CAPSULE | Freq: Two times a day (BID) | ORAL | 1 refills | Status: DC

## 2024-09-25 NOTE — Telephone Encounter (Signed)
 Oral Oncology Pharmacist Encounter  Received new prescription for Calquence  (acalabrutinib ) for the treatment of CLL, planned duration until disease progression or unacceptable toxicity. Patient was started on calquence  on 09/04/24 at [facility]. Patient is currently on HSV prophylaxis with valacyclovir.   Unable to assess recent labs as patient was started while inpatient. Patient will receive labs at [facility] cancer center during the month including any side effect management. Prescription dose and frequency assessed for appropriateness. Patient will be able to fill 2 cycles of therapy at [facility] and then will need to receive from [place]. Patient is on calquence  tablets, will modify prescription from capsules to tablets. Patient does not need additional education since patient was started on therapy at outside facility, patient does  not have any questions or concerns with therapy decision.   Current medication list in Epic reviewed, DDIs with Calquence  identified: - clopidogrel   and sertraline (cat C): calquence  may increase antiplatelet effects of clopidogrel  and sertraline. Will notify MD and will have patient monitored for any signs of bleeding while administered together. - pantoprazole : not an issue with the calquence  tablets and is only a DDI with the capsules.   Evaluated chart and no patient barriers to medication adherence noted.   Medication list updated in patients chart per facility documentation presented.   Prescription has been e-scribed to the [facility] for benefits analysis and approval.  Oral Oncology Clinic will continue to follow for insurance authorization, copayment issues, initial counseling and start date.  Gearldene Fiorenza, PharmD Hematology/Oncology Clinical Pharmacist [facility] Oral Chemotherapy Navigation Clinic [phone] 09/25/2024 10:08 AM

## 2024-09-25 NOTE — Telephone Encounter (Signed)
 Oral Oncology Patient Advocate Encounter  After completing a benefits investigation, prior authorization for Calquence  100mg  is not required at this time through [employer].  Patient's copay is $48.     Lucie Lamer, CPhT [place]  [facility] Specialty Pharmacy Services Oncology Pharmacy Patient Advocate Specialist [address] [place] Phone: [phone]  Fax: [phone] [email or web address]

## 2024-09-25 NOTE — Progress Notes (Unsigned)
 Specialty Pharmacy Initial Fill Coordination Note  Andre Ross is a 79 y.o. male contacted today regarding refills of specialty medication(s) Acalabrutinib  Maleate (Calquence ) .  Patient requested Delivery  on 09/27/24  to verified address [facility]: [address] [place], [place], [place] [address]   Medication will be filled on 09/26/24.   Patient is aware of $48 copayment.

## 2024-09-26 ENCOUNTER — Telehealth: Payer: Self-pay

## 2024-09-26 NOTE — Progress Notes (Signed)
 Patient counseled while inpatient at [facility] and received counseling prior to dispensing at [facility]. Patient had no questions or concerns with continuing therapy.   Khari Mally, PharmD Hematology/Oncology Clinical Pharmacist [facility]otherapy Navigation Clinic [phone]

## 2024-09-26 NOTE — Telephone Encounter (Addendum)
 Calquence  100mg  po BID.  Pt is taking the Calquence  @ 9am, and 9 pm. No missed doses. She reports that when pt arrived to the facility, he had some petechia on lower extremities. Pt has baseline swelling in lower extremities, and takes Bumex. They haven't noticed any increase in swelling since he arrived. The facility physician was notified and it was reported it was from low platelets he had while hospitalized. I instructed her to make facility staff aware that if he develops a temp of 100.4 or higher, day or night, they should call us . She verbalized understanding. I made f/u appt and lab appt for 10/04/2024. See chemo f/u call flowsheet for further symptom assessment. Please add lab orders you would like for 10/04/2024.

## 2024-09-27 ENCOUNTER — Other Ambulatory Visit: Payer: Self-pay

## 2024-10-04 ENCOUNTER — Inpatient Hospital Stay

## 2024-10-04 ENCOUNTER — Inpatient Hospital Stay: Attending: Hematology and Oncology | Admitting: Oncology
